# Patient Record
Sex: Female | Born: 1935
Health system: Southern US, Community
[De-identification: ages and names within clinical notes are randomized; demographics above are authoritative.]

## PROBLEM LIST (undated history)

## (undated) DIAGNOSIS — K5790 Diverticulosis of intestine, part unspecified, without perforation or abscess without bleeding: Secondary | ICD-10-CM

## (undated) DIAGNOSIS — E039 Hypothyroidism, unspecified: Secondary | ICD-10-CM

## (undated) DIAGNOSIS — J189 Pneumonia, unspecified organism: Secondary | ICD-10-CM

## (undated) DIAGNOSIS — C801 Malignant (primary) neoplasm, unspecified: Secondary | ICD-10-CM

## (undated) DIAGNOSIS — R112 Nausea with vomiting, unspecified: Secondary | ICD-10-CM

## (undated) DIAGNOSIS — R350 Frequency of micturition: Secondary | ICD-10-CM

## (undated) DIAGNOSIS — B019 Varicella without complication: Secondary | ICD-10-CM

## (undated) DIAGNOSIS — D649 Anemia, unspecified: Secondary | ICD-10-CM

## (undated) DIAGNOSIS — I10 Essential (primary) hypertension: Secondary | ICD-10-CM

## (undated) DIAGNOSIS — M1990 Unspecified osteoarthritis, unspecified site: Secondary | ICD-10-CM

## (undated) DIAGNOSIS — Z9889 Other specified postprocedural states: Secondary | ICD-10-CM

## (undated) DIAGNOSIS — Z860100 Personal history of colon polyps, unspecified: Secondary | ICD-10-CM

## (undated) DIAGNOSIS — M2550 Pain in unspecified joint: Secondary | ICD-10-CM

## (undated) DIAGNOSIS — M549 Dorsalgia, unspecified: Secondary | ICD-10-CM

## (undated) DIAGNOSIS — R3915 Urgency of urination: Secondary | ICD-10-CM

## (undated) HISTORY — DX: Varicella without complication: B01.9

## (undated) HISTORY — PX: KNEE ARTHROSCOPY: SUR90

## (undated) HISTORY — PX: HAND TENDON SURGERY: SHX663

## (undated) HISTORY — PX: COLONOSCOPY: SHX174

## (undated) HISTORY — PX: EYE SURGERY: SHX253

---

## 1998-06-14 HISTORY — PX: CHOLECYSTECTOMY: SHX55

## 1998-10-02 ENCOUNTER — Other Ambulatory Visit: Admission: RE | Admit: 1998-10-02 | Discharge: 1998-10-02 | Payer: Self-pay | Admitting: Family Medicine

## 1998-12-04 ENCOUNTER — Ambulatory Visit (HOSPITAL_COMMUNITY): Admission: RE | Admit: 1998-12-04 | Discharge: 1998-12-04 | Payer: Self-pay | Admitting: *Deleted

## 1998-12-04 ENCOUNTER — Encounter (INDEPENDENT_AMBULATORY_CARE_PROVIDER_SITE_OTHER): Payer: Self-pay | Admitting: Specialist

## 1999-08-21 ENCOUNTER — Encounter: Payer: Self-pay | Admitting: Orthopedic Surgery

## 1999-08-25 ENCOUNTER — Encounter: Payer: Self-pay | Admitting: Orthopedic Surgery

## 1999-08-25 ENCOUNTER — Inpatient Hospital Stay (HOSPITAL_COMMUNITY): Admission: RE | Admit: 1999-08-25 | Discharge: 1999-08-27 | Payer: Self-pay | Admitting: Orthopedic Surgery

## 1999-12-31 ENCOUNTER — Other Ambulatory Visit: Admission: RE | Admit: 1999-12-31 | Discharge: 1999-12-31 | Payer: Self-pay | Admitting: Family Medicine

## 2001-03-14 ENCOUNTER — Other Ambulatory Visit: Admission: RE | Admit: 2001-03-14 | Discharge: 2001-03-14 | Payer: Self-pay | Admitting: Family Medicine

## 2009-07-03 ENCOUNTER — Encounter: Admission: RE | Admit: 2009-07-03 | Discharge: 2009-08-01 | Payer: Self-pay | Admitting: Rheumatology

## 2011-04-05 ENCOUNTER — Other Ambulatory Visit: Payer: Self-pay | Admitting: Rheumatology

## 2011-04-05 DIAGNOSIS — M545 Low back pain: Secondary | ICD-10-CM

## 2011-04-10 ENCOUNTER — Ambulatory Visit
Admission: RE | Admit: 2011-04-10 | Discharge: 2011-04-10 | Disposition: A | Discharge status: Home / Self Care | Payer: MEDICARE | Source: Ambulatory Visit | Attending: Rheumatology | Admitting: Rheumatology

## 2011-04-10 DIAGNOSIS — M545 Low back pain: Secondary | ICD-10-CM

## 2012-05-19 ENCOUNTER — Encounter (INDEPENDENT_AMBULATORY_CARE_PROVIDER_SITE_OTHER): Payer: Self-pay | Admitting: Surgery

## 2012-05-19 ENCOUNTER — Ambulatory Visit (INDEPENDENT_AMBULATORY_CARE_PROVIDER_SITE_OTHER): Payer: Medicare Other | Admitting: Surgery

## 2012-05-19 VITALS — BP 128/76 | HR 80 | Temp 97.4°F | Resp 18 | Ht 64.0 in | Wt 151.4 lb

## 2012-05-19 DIAGNOSIS — R1031 Right lower quadrant pain: Secondary | ICD-10-CM | POA: Insufficient documentation

## 2012-05-19 NOTE — Progress Notes (Signed)
Re:   Jennifer Goodman DOB:   06/04/1935 MRN:   [id number]  ASSESSMENT AND PLAN: 1.  RLQ abdominal pain - non specific  Not an obvious hernia.  Options include CT scan of abdomen vs doing nothing   I gave her literature on hernias.  At this time, she does not want to do anything further.  But she knows if the pain increases or she has some change to let Dr. Ronne Binning or me know.  I left her appt with me PRN.  2.  Hypertension 3.  Corrected hypothyroidism 4.  Chronic kidney disease, Stage III  Creatinine - 1.02 - 04/10/2012. 5.  GERD 6.  Arthritis, Rheumatoid  Since 1996  Sees Dr. Azzie Roup for rheumatology 7.  Chronic pain meds  On Fentanyl 25 mcgm/3 days and takes one hydrocodone q day  Chief Complaint  Patient presents with  . New Evaluation    RLQ pain   REFERRING PHYSICIAN: Thayer Headings, MD  HISTORY OF PRESENT ILLNESS: Jennifer Goodman is a 77 y.o. (DOB: 30-Nov-1934)  white female whose primary care physician is [facility], MD and comes to me today for RLQ pain, questionable hernia.  Her husband is Suanne Marker, whom I know through [facility].  Patient has had pain for more than 6 months. Localizes the pain to the right lower quadrant. He describes as a burning in quality. The pain bothers her when she plays golf  Of pushes a lawnmower. She's never felt a mass or lump in the area of the pain. Her only abdominal surgery for laparoscopic cholecystectomy by Dr. Kendrick Ranch in 1996. She has had a colonoscopy in 2010 Dr. Marisue Brooklyn.  She has some diverticulosis.  She plans another colonoscopy in the next few months.  She's had not gyn surgery.   No past medical history on file.   No past surgical history on file.    Current Outpatient Prescriptions  Medication Sig Dispense Refill  . etanercept (ENBREL) 50 MG/ML injection Inject 50 mg into the skin once a week.      . fentaNYL (DURAGESIC - DOSED MCG/HR) 25 MCG/HR Place 1 patch onto the skin every 3 (three) days.       . folic acid (FOLVITE) 1 MG tablet Take 1 mg by mouth daily.      . hydrocodone-acetaminophen (LORCET-HD) 5-500 MG per capsule Take 1 capsule by mouth every 6 (six) hours as needed.      . irbesartan (AVAPRO) 150 MG tablet Take 150 mg by mouth at bedtime.      Marland Kitchen levothyroxine (SYNTHROID, LEVOTHROID) 25 MCG tablet Take 25 mcg by mouth daily.      . methotrexate (RHEUMATREX) 2.5 MG tablet Take 2.5 mg by mouth once a week. Caution:Chemotherapy. Protect from light.      . vitamin B-12 (CYANOCOBALAMIN) 100 MCG tablet Take 50 mcg by mouth daily.      . Vitamin D, Ergocalciferol, (DRISDOL) 50000 UNITS CAPS Take 50,000 Units by mouth.         No Known Allergies  REVIEW OF SYSTEMS: Skin:  No history of rash.  No history of abnormal moles. Infection:  No history of hepatitis or HIV.  No history of MRSA. Neurologic:  No history of stroke.  No history of seizure.  No history of headaches. Cardiac: Hypertension.  No history of heart disease.   Pulmonary:  Does not smoke cigarettes.  No asthma or bronchitis.  No OSA/CPAP.  Quit smoking about 30 years ago.  Endocrine:  No diabetes. Corrected hypothyroidism.  TSH - 0.945 - 04/10/2012. Gastrointestinal:  See HPI. Urologic:  Chronic kidney disease, Stage III.  Creatinine - 1.02 - 04/10/2012. Musculoskeletal:  Rheumatoid arthritis.  Sees Dr. Azzie Roup.  On Methotrexate and Embrel.  Had knee surgery in the remote past by Dr. Priscille Kluver.  Had right wrist surgery by Dr. Trina Ao for RA. Hematologic:  No bleeding disorder.  No history of anemia.  Not anticoagulated. Psycho-social:  The patient is oriented.   The patient has no obvious psychologic or social impairment to understanding our conversation and plan.  SOCIAL and FAMILY HISTORY: Married. Husband is Suanne Marker.  PHYSICAL EXAM: BP 128/76  Pulse 80  Temp 97.4 F (36.3 C) (Temporal)  Resp 18  Ht 5\' 4"  (1.626 m)  Wt 151 lb 6.4 oz (68.675 kg)  BMI 25.99 kg/m2  General: Older WN WF,  somewhat stooped, who is alert. HEENT: Normal. Pupils equal. Neck: Supple. No mass.  No thyroid mass. Lymph Nodes:  No inguinal adenopathy. Lungs: Clear to auscultation and symmetric breath sounds. Heart:  RRR. No murmur or rub. Abdomen: Soft. No mass. No tenderness. No hernia. Normal bowel sounds.  Scars of lap chole. Extremities:  RA changes of hands and wrists. Neurologic:  Grossly intact to motor and sensory function. Psychiatric: Has normal mood and affect. Behavior is normal.   DATA REVIEWED: Dr. Dimas Millin office notes and labs.  Ovidio Kin, MD,  [facility] [address] [place]  [address]   [place], [place]    [address] Phone:  [phone] FAX:  [phone]

## 2014-06-20 DIAGNOSIS — D81818 Other biotin-dependent carboxylase deficiency: Secondary | ICD-10-CM | POA: Diagnosis not present

## 2014-06-27 DIAGNOSIS — L570 Actinic keratosis: Secondary | ICD-10-CM | POA: Diagnosis not present

## 2014-06-27 DIAGNOSIS — L821 Other seborrheic keratosis: Secondary | ICD-10-CM | POA: Diagnosis not present

## 2014-06-27 DIAGNOSIS — Z85828 Personal history of other malignant neoplasm of skin: Secondary | ICD-10-CM | POA: Diagnosis not present

## 2014-07-22 DIAGNOSIS — E538 Deficiency of other specified B group vitamins: Secondary | ICD-10-CM | POA: Diagnosis not present

## 2014-08-21 DIAGNOSIS — M0579 Rheumatoid arthritis with rheumatoid factor of multiple sites without organ or systems involvement: Secondary | ICD-10-CM | POA: Diagnosis not present

## 2014-09-05 DIAGNOSIS — M859 Disorder of bone density and structure, unspecified: Secondary | ICD-10-CM | POA: Diagnosis not present

## 2014-09-05 DIAGNOSIS — I10 Essential (primary) hypertension: Secondary | ICD-10-CM | POA: Diagnosis not present

## 2014-09-05 DIAGNOSIS — E538 Deficiency of other specified B group vitamins: Secondary | ICD-10-CM | POA: Diagnosis not present

## 2014-09-12 DIAGNOSIS — N183 Chronic kidney disease, stage 3 (moderate): Secondary | ICD-10-CM | POA: Diagnosis not present

## 2014-09-12 DIAGNOSIS — K219 Gastro-esophageal reflux disease without esophagitis: Secondary | ICD-10-CM | POA: Diagnosis not present

## 2014-09-12 DIAGNOSIS — E039 Hypothyroidism, unspecified: Secondary | ICD-10-CM | POA: Diagnosis not present

## 2014-09-12 DIAGNOSIS — I10 Essential (primary) hypertension: Secondary | ICD-10-CM | POA: Diagnosis not present

## 2014-09-23 DIAGNOSIS — M19012 Primary osteoarthritis, left shoulder: Secondary | ICD-10-CM | POA: Diagnosis not present

## 2014-09-23 DIAGNOSIS — M25512 Pain in left shoulder: Secondary | ICD-10-CM | POA: Diagnosis not present

## 2014-09-23 DIAGNOSIS — M159 Polyosteoarthritis, unspecified: Secondary | ICD-10-CM | POA: Diagnosis not present

## 2014-09-23 DIAGNOSIS — M0579 Rheumatoid arthritis with rheumatoid factor of multiple sites without organ or systems involvement: Secondary | ICD-10-CM | POA: Diagnosis not present

## 2014-09-23 DIAGNOSIS — D81818 Other biotin-dependent carboxylase deficiency: Secondary | ICD-10-CM | POA: Diagnosis not present

## 2014-10-18 DIAGNOSIS — H26493 Other secondary cataract, bilateral: Secondary | ICD-10-CM | POA: Diagnosis not present

## 2014-10-23 DIAGNOSIS — D81818 Other biotin-dependent carboxylase deficiency: Secondary | ICD-10-CM | POA: Diagnosis not present

## 2014-11-18 DIAGNOSIS — Z79899 Other long term (current) drug therapy: Secondary | ICD-10-CM | POA: Diagnosis not present

## 2014-11-18 DIAGNOSIS — M069 Rheumatoid arthritis, unspecified: Secondary | ICD-10-CM | POA: Diagnosis not present

## 2014-11-18 DIAGNOSIS — G894 Chronic pain syndrome: Secondary | ICD-10-CM | POA: Diagnosis not present

## 2014-11-18 DIAGNOSIS — M542 Cervicalgia: Secondary | ICD-10-CM | POA: Diagnosis not present

## 2014-11-18 DIAGNOSIS — M1990 Unspecified osteoarthritis, unspecified site: Secondary | ICD-10-CM | POA: Diagnosis not present

## 2014-11-25 DIAGNOSIS — E538 Deficiency of other specified B group vitamins: Secondary | ICD-10-CM | POA: Diagnosis not present

## 2014-12-26 DIAGNOSIS — L820 Inflamed seborrheic keratosis: Secondary | ICD-10-CM | POA: Diagnosis not present

## 2014-12-26 DIAGNOSIS — L570 Actinic keratosis: Secondary | ICD-10-CM | POA: Diagnosis not present

## 2014-12-26 DIAGNOSIS — L821 Other seborrheic keratosis: Secondary | ICD-10-CM | POA: Diagnosis not present

## 2014-12-26 DIAGNOSIS — E538 Deficiency of other specified B group vitamins: Secondary | ICD-10-CM | POA: Diagnosis not present

## 2014-12-26 DIAGNOSIS — C44622 Squamous cell carcinoma of skin of right upper limb, including shoulder: Secondary | ICD-10-CM | POA: Diagnosis not present

## 2014-12-30 DIAGNOSIS — Z79899 Other long term (current) drug therapy: Secondary | ICD-10-CM | POA: Diagnosis not present

## 2014-12-30 DIAGNOSIS — G894 Chronic pain syndrome: Secondary | ICD-10-CM | POA: Diagnosis not present

## 2014-12-30 DIAGNOSIS — M1990 Unspecified osteoarthritis, unspecified site: Secondary | ICD-10-CM | POA: Diagnosis not present

## 2014-12-30 DIAGNOSIS — M069 Rheumatoid arthritis, unspecified: Secondary | ICD-10-CM | POA: Diagnosis not present

## 2014-12-30 DIAGNOSIS — M542 Cervicalgia: Secondary | ICD-10-CM | POA: Diagnosis not present

## 2015-01-02 DIAGNOSIS — C44622 Squamous cell carcinoma of skin of right upper limb, including shoulder: Secondary | ICD-10-CM | POA: Diagnosis not present

## 2015-01-03 DIAGNOSIS — R350 Frequency of micturition: Secondary | ICD-10-CM | POA: Diagnosis not present

## 2015-01-03 DIAGNOSIS — N3946 Mixed incontinence: Secondary | ICD-10-CM | POA: Diagnosis not present

## 2015-01-21 DIAGNOSIS — M25562 Pain in left knee: Secondary | ICD-10-CM | POA: Diagnosis not present

## 2015-01-21 DIAGNOSIS — M1712 Unilateral primary osteoarthritis, left knee: Secondary | ICD-10-CM | POA: Diagnosis not present

## 2015-01-21 DIAGNOSIS — M67912 Unspecified disorder of synovium and tendon, left shoulder: Secondary | ICD-10-CM | POA: Diagnosis not present

## 2015-01-27 DIAGNOSIS — E538 Deficiency of other specified B group vitamins: Secondary | ICD-10-CM | POA: Diagnosis not present

## 2015-01-27 DIAGNOSIS — Z79899 Other long term (current) drug therapy: Secondary | ICD-10-CM | POA: Diagnosis not present

## 2015-01-27 DIAGNOSIS — M069 Rheumatoid arthritis, unspecified: Secondary | ICD-10-CM | POA: Diagnosis not present

## 2015-01-27 DIAGNOSIS — M1990 Unspecified osteoarthritis, unspecified site: Secondary | ICD-10-CM | POA: Diagnosis not present

## 2015-01-27 DIAGNOSIS — G894 Chronic pain syndrome: Secondary | ICD-10-CM | POA: Diagnosis not present

## 2015-01-27 DIAGNOSIS — M542 Cervicalgia: Secondary | ICD-10-CM | POA: Diagnosis not present

## 2015-01-29 DIAGNOSIS — M150 Primary generalized (osteo)arthritis: Secondary | ICD-10-CM | POA: Diagnosis not present

## 2015-01-29 DIAGNOSIS — M0589 Other rheumatoid arthritis with rheumatoid factor of multiple sites: Secondary | ICD-10-CM | POA: Diagnosis not present

## 2015-02-25 DIAGNOSIS — S51801A Unspecified open wound of right forearm, initial encounter: Secondary | ICD-10-CM | POA: Diagnosis not present

## 2015-02-25 DIAGNOSIS — L03113 Cellulitis of right upper limb: Secondary | ICD-10-CM | POA: Diagnosis not present

## 2015-02-28 DIAGNOSIS — E538 Deficiency of other specified B group vitamins: Secondary | ICD-10-CM | POA: Diagnosis not present

## 2015-03-04 DIAGNOSIS — M67912 Unspecified disorder of synovium and tendon, left shoulder: Secondary | ICD-10-CM | POA: Diagnosis not present

## 2015-03-04 DIAGNOSIS — M25562 Pain in left knee: Secondary | ICD-10-CM | POA: Diagnosis not present

## 2015-03-07 ENCOUNTER — Other Ambulatory Visit: Payer: Self-pay | Admitting: Orthopedic Surgery

## 2015-03-07 DIAGNOSIS — M25512 Pain in left shoulder: Secondary | ICD-10-CM

## 2015-03-18 ENCOUNTER — Ambulatory Visit
Admission: RE | Admit: 2015-03-18 | Discharge: 2015-03-18 | Disposition: A | Discharge status: Home / Self Care | Payer: Medicare Other | Source: Ambulatory Visit | Attending: Orthopedic Surgery | Admitting: Orthopedic Surgery

## 2015-03-18 DIAGNOSIS — M25512 Pain in left shoulder: Secondary | ICD-10-CM

## 2015-03-18 DIAGNOSIS — M75122 Complete rotator cuff tear or rupture of left shoulder, not specified as traumatic: Secondary | ICD-10-CM | POA: Diagnosis not present

## 2015-03-20 DIAGNOSIS — M8580 Other specified disorders of bone density and structure, unspecified site: Secondary | ICD-10-CM | POA: Diagnosis not present

## 2015-03-20 DIAGNOSIS — I10 Essential (primary) hypertension: Secondary | ICD-10-CM | POA: Diagnosis not present

## 2015-03-20 DIAGNOSIS — F17211 Nicotine dependence, cigarettes, in remission: Secondary | ICD-10-CM | POA: Diagnosis not present

## 2015-03-20 DIAGNOSIS — Z23 Encounter for immunization: Secondary | ICD-10-CM | POA: Diagnosis not present

## 2015-03-20 DIAGNOSIS — Z0000 Encounter for general adult medical examination without abnormal findings: Secondary | ICD-10-CM | POA: Diagnosis not present

## 2015-03-20 DIAGNOSIS — E538 Deficiency of other specified B group vitamins: Secondary | ICD-10-CM | POA: Diagnosis not present

## 2015-03-20 DIAGNOSIS — Z1389 Encounter for screening for other disorder: Secondary | ICD-10-CM | POA: Diagnosis not present

## 2015-03-24 DIAGNOSIS — M67912 Unspecified disorder of synovium and tendon, left shoulder: Secondary | ICD-10-CM | POA: Diagnosis not present

## 2015-03-27 DIAGNOSIS — N183 Chronic kidney disease, stage 3 (moderate): Secondary | ICD-10-CM | POA: Diagnosis not present

## 2015-03-27 DIAGNOSIS — M8580 Other specified disorders of bone density and structure, unspecified site: Secondary | ICD-10-CM | POA: Diagnosis not present

## 2015-03-27 DIAGNOSIS — I129 Hypertensive chronic kidney disease with stage 1 through stage 4 chronic kidney disease, or unspecified chronic kidney disease: Secondary | ICD-10-CM | POA: Diagnosis not present

## 2015-03-27 DIAGNOSIS — E039 Hypothyroidism, unspecified: Secondary | ICD-10-CM | POA: Diagnosis not present

## 2015-04-01 DIAGNOSIS — E538 Deficiency of other specified B group vitamins: Secondary | ICD-10-CM | POA: Diagnosis not present

## 2015-04-10 DIAGNOSIS — M25561 Pain in right knee: Secondary | ICD-10-CM | POA: Diagnosis not present

## 2015-04-10 DIAGNOSIS — M25562 Pain in left knee: Secondary | ICD-10-CM | POA: Diagnosis not present

## 2015-04-10 DIAGNOSIS — M1711 Unilateral primary osteoarthritis, right knee: Secondary | ICD-10-CM | POA: Diagnosis not present

## 2015-04-10 DIAGNOSIS — M1712 Unilateral primary osteoarthritis, left knee: Secondary | ICD-10-CM | POA: Diagnosis not present

## 2015-04-29 DIAGNOSIS — Z1231 Encounter for screening mammogram for malignant neoplasm of breast: Secondary | ICD-10-CM | POA: Diagnosis not present

## 2015-04-29 DIAGNOSIS — M1712 Unilateral primary osteoarthritis, left knee: Secondary | ICD-10-CM | POA: Diagnosis not present

## 2015-05-01 ENCOUNTER — Other Ambulatory Visit: Payer: Self-pay | Admitting: Orthopedic Surgery

## 2015-05-05 ENCOUNTER — Encounter (HOSPITAL_COMMUNITY): Payer: Self-pay | Admitting: *Deleted

## 2015-05-05 ENCOUNTER — Emergency Department (HOSPITAL_COMMUNITY): Payer: Medicare Other

## 2015-05-05 ENCOUNTER — Emergency Department (HOSPITAL_COMMUNITY)
Admission: EM | Admit: 2015-05-05 | Discharge: 2015-05-05 | Disposition: A | Discharge status: Home / Self Care | Payer: Medicare Other | Attending: Emergency Medicine | Admitting: Emergency Medicine

## 2015-05-05 DIAGNOSIS — Z79891 Long term (current) use of opiate analgesic: Secondary | ICD-10-CM | POA: Diagnosis not present

## 2015-05-05 DIAGNOSIS — R1013 Epigastric pain: Secondary | ICD-10-CM | POA: Insufficient documentation

## 2015-05-05 DIAGNOSIS — R112 Nausea with vomiting, unspecified: Secondary | ICD-10-CM | POA: Diagnosis not present

## 2015-05-05 DIAGNOSIS — Z87891 Personal history of nicotine dependence: Secondary | ICD-10-CM | POA: Diagnosis not present

## 2015-05-05 DIAGNOSIS — R1012 Left upper quadrant pain: Secondary | ICD-10-CM | POA: Diagnosis not present

## 2015-05-05 DIAGNOSIS — Z79899 Other long term (current) drug therapy: Secondary | ICD-10-CM | POA: Insufficient documentation

## 2015-05-05 DIAGNOSIS — E079 Disorder of thyroid, unspecified: Secondary | ICD-10-CM | POA: Insufficient documentation

## 2015-05-05 DIAGNOSIS — M1990 Unspecified osteoarthritis, unspecified site: Secondary | ICD-10-CM | POA: Diagnosis not present

## 2015-05-05 DIAGNOSIS — R109 Unspecified abdominal pain: Secondary | ICD-10-CM | POA: Diagnosis not present

## 2015-05-05 DIAGNOSIS — I10 Essential (primary) hypertension: Secondary | ICD-10-CM | POA: Insufficient documentation

## 2015-05-05 HISTORY — DX: Unspecified osteoarthritis, unspecified site: M19.90

## 2015-05-05 HISTORY — DX: Essential (primary) hypertension: I10

## 2015-05-05 LAB — URINALYSIS, ROUTINE W REFLEX MICROSCOPIC
Bilirubin Urine: NEGATIVE
GLUCOSE, UA: NEGATIVE mg/dL
HGB URINE DIPSTICK: NEGATIVE
Ketones, ur: NEGATIVE mg/dL
LEUKOCYTES UA: NEGATIVE
Nitrite: NEGATIVE
PH: 8 (ref 5.0–8.0)
PROTEIN: NEGATIVE mg/dL
SPECIFIC GRAVITY, URINE: 1.017 (ref 1.005–1.030)

## 2015-05-05 LAB — COMPREHENSIVE METABOLIC PANEL
ALT: 19 U/L (ref 14–54)
AST: 22 U/L (ref 15–41)
Albumin: 3.4 g/dL — ABNORMAL LOW (ref 3.5–5.0)
Alkaline Phosphatase: 52 U/L (ref 38–126)
Anion gap: 11 (ref 5–15)
BUN: 8 mg/dL (ref 6–20)
CHLORIDE: 103 mmol/L (ref 101–111)
CO2: 22 mmol/L (ref 22–32)
Calcium: 8.9 mg/dL (ref 8.9–10.3)
Creatinine, Ser: 1.04 mg/dL — ABNORMAL HIGH (ref 0.44–1.00)
GFR, EST AFRICAN AMERICAN: 58 mL/min — AB (ref >60)
GFR, EST NON AFRICAN AMERICAN: 50 mL/min — AB (ref >60)
Glucose, Bld: 108 mg/dL — ABNORMAL HIGH (ref 65–99)
POTASSIUM: 3.6 mmol/L (ref 3.5–5.1)
SODIUM: 136 mmol/L (ref 135–145)
Total Bilirubin: 0.9 mg/dL (ref 0.3–1.2)
Total Protein: 5.6 g/dL — ABNORMAL LOW (ref 6.5–8.1)

## 2015-05-05 LAB — CBC
HEMATOCRIT: 36.2 % (ref 36.0–46.0)
HEMOGLOBIN: 12 g/dL (ref 12.0–15.0)
MCH: 33.1 pg (ref 26.0–34.0)
MCHC: 33.1 g/dL (ref 30.0–36.0)
MCV: 99.7 fL (ref 78.0–100.0)
Platelets: 220 10*3/uL (ref 150–400)
RBC: 3.63 MIL/uL — AB (ref 3.87–5.11)
RDW: 14.9 % (ref 11.5–15.5)
WBC: 5.4 10*3/uL (ref 4.0–10.5)

## 2015-05-05 LAB — LIPASE, BLOOD: LIPASE: 26 U/L (ref 11–51)

## 2015-05-05 MED ORDER — PANTOPRAZOLE SODIUM 20 MG PO TBEC
20.0000 mg | DELAYED_RELEASE_TABLET | Freq: Every day | ORAL | Status: DC
Start: 2015-05-05 — End: 2015-06-06

## 2015-05-05 MED ORDER — BARIUM SULFATE 2.1 % PO SUSP
ORAL | Status: AC
  Filled 2015-05-05: qty 1

## 2015-05-05 MED ORDER — MORPHINE SULFATE (PF) 4 MG/ML IV SOLN
4.0000 mg | Freq: Once | INTRAVENOUS | Status: AC
  Administered 2015-05-05: 4 mg via INTRAVENOUS
  Filled 2015-05-05: qty 1

## 2015-05-05 MED ORDER — ONDANSETRON HCL 4 MG/2ML IJ SOLN: 4.0000 mg | Freq: Once | INTRAMUSCULAR | Status: DC

## 2015-05-05 MED ORDER — SUCRALFATE 1 G PO TABS: 1.0000 g | ORAL_TABLET | Freq: Three times a day (TID) | ORAL | Status: DC

## 2015-05-05 MED ORDER — IOHEXOL 300 MG/ML  SOLN
50.0000 mL | Freq: Once | INTRAMUSCULAR | Status: DC | PRN
  Administered 2015-05-05: 100 mL via INTRAVENOUS
  Filled 2015-05-05: qty 50

## 2015-05-05 MED ORDER — ONDANSETRON HCL 4 MG/2ML IJ SOLN
4.0000 mg | Freq: Once | INTRAMUSCULAR | Status: AC | PRN
  Administered 2015-05-05: 4 mg via INTRAVENOUS
  Filled 2015-05-05: qty 2

## 2015-05-05 MED ORDER — SODIUM CHLORIDE 0.9 % IV BOLUS (SEPSIS)
1000.0000 mL | Freq: Once | INTRAVENOUS | Status: AC
  Administered 2015-05-05: 1000 mL via INTRAVENOUS

## 2015-05-05 MED ORDER — HYDROCODONE-ACETAMINOPHEN 5-325 MG PO TABS: 0.5000 | ORAL_TABLET | ORAL | Status: DC | PRN

## 2015-05-05 MED ORDER — PANTOPRAZOLE SODIUM 40 MG IV SOLR
40.0000 mg | Freq: Once | INTRAVENOUS | Status: AC
  Administered 2015-05-05: 40 mg via INTRAVENOUS
  Filled 2015-05-05: qty 40

## 2015-05-05 NOTE — ED Notes (Signed)
Pt presents via POV c/o abdominal pain and nausea/vomiting since Saturday.  Pain mainly in LUQ but spreads throughout abdomen.  Denies fever/chills.  Pt a x 4, NAD.

## 2015-05-05 NOTE — ED Notes (Signed)
PA at the bedside.

## 2015-05-05 NOTE — ED Notes (Signed)
Patient given heating packs for stomach and wheeled out by Amy, NT

## 2015-05-05 NOTE — ED Provider Notes (Signed)
CSN: [id number]     Arrival date & time 05/05/15  0720 History   First MD Initiated Contact with Patient 05/05/15 0735     Chief Complaint  Patient presents with  . Abdominal Pain     (Consider location/radiation/quality/duration/timing/severity/associated sxs/prior Treatment) HPI   Jennifer Goodman is a(n) 80 y.o. female who presents with cc of abdominal pain. The patient has a past medical history of rheumatoid arthritis. She is currently taking Enbrel, methotrexate. The patient also has a history of left knee arthritis and is taking diclofenac. She is scheduled for left total knee arthroplasty. She has a past surgical history of cholecystectomy. Patient states that Saturday evening, 2 days ago she began having left upper quadrant and epigastric abdominal pain. She states that yesterday it was intermittent and "not that bad." Around 11 PM she began having vomiting of nonbloody, nonbilious vomitus. She had one more episode of vomiting overnight. The patient denies a history of constipation, although she is on fentanyl patch for chronic pain. She denies fever or chills. She denies chest pain, shortness of breath, or history of coronary artery disease. She has a known history of diverticulosis.   Past Medical History  Diagnosis Date  . Thyroid disease   . Hypertension   . Arthritis    Past Surgical History  Procedure Laterality Date  . Knee arthroscopy    . Cholecystectomy    . Eye surgery     Family History  Problem Relation Age of Onset  . Cancer Father     colon ca/ lung ca  . Heart disease Father    Social History  Substance Use Topics  . Smoking status: Former Research scientist (life sciences)  . Smokeless tobacco: None     Comment: quit 32 yrs ago  . Alcohol Use: Yes   OB History    No data available     Review of Systems  Ten systems reviewed and are negative for acute change, except as noted in the HPI.    Allergies  Review of patient's allergies indicates no known allergies.  Home  Medications   Prior to Admission medications   Medication Sig Start Date End Date Taking? Authorizing Provider  etanercept (ENBREL) 50 MG/ML injection Inject 50 mg into the skin once a week.    Historical Provider, MD  fentaNYL (DURAGESIC - DOSED MCG/HR) 25 MCG/HR Place 1 patch onto the skin every 3 (three) days.    Historical Provider, MD  folic acid (FOLVITE) 1 MG tablet Take 1 mg by mouth daily.    Historical Provider, MD  hydrocodone-acetaminophen (LORCET-HD) 5-500 MG per capsule Take 1 capsule by mouth every 6 (six) hours as needed.    Historical Provider, MD  irbesartan (AVAPRO) 150 MG tablet Take 150 mg by mouth at bedtime.    Historical Provider, MD  levothyroxine (SYNTHROID, LEVOTHROID) 25 MCG tablet Take 25 mcg by mouth daily.    Historical Provider, MD  methotrexate (RHEUMATREX) 2.5 MG tablet Take 2.5 mg by mouth once a week. Caution:Chemotherapy. Protect from light.    Historical Provider, MD  vitamin B-12 (CYANOCOBALAMIN) 100 MCG tablet Take 50 mcg by mouth daily.    Historical Provider, MD  Vitamin D, Ergocalciferol, (DRISDOL) 50000 UNITS CAPS Take 50,000 Units by mouth.    Historical Provider, MD   There were no vitals taken for this visit. Physical Exam  Constitutional: She is oriented to person, place, and time. She appears well-developed and well-nourished. No distress.  Appears uncomfortable  HENT:  Head: Normocephalic and  atraumatic.  Eyes: Conjunctivae are normal. No scleral icterus.  Neck: Normal range of motion.  Cardiovascular: Normal rate, regular rhythm and normal heart sounds.  Exam reveals no gallop and no friction rub.   No murmur heard. Pulmonary/Chest: Effort normal and breath sounds normal. No respiratory distress.  Abdominal: Soft. Bowel sounds are normal. She exhibits no distension and no mass. There is tenderness. There is no guarding.  Tenderness in the epigastrium and left upper quadrant.  Neurological: She is alert and oriented to person, place, and  time.  Skin: Skin is warm and dry. She is not diaphoretic.  Nursing note and vitals reviewed.   ED Course  Procedures (including critical care time) Labs Review Labs Reviewed  CBC - Abnormal; Notable for the following:    RBC 3.63 (*)    All other components within normal limits  LIPASE, BLOOD  COMPREHENSIVE METABOLIC PANEL  URINALYSIS, ROUTINE W REFLEX MICROSCOPIC (NOT AT [facility])    Imaging Review No results found. I have personally reviewed and evaluated these images and lab results as part of my medical decision-making.   EKG Interpretation None      MDM   Final diagnoses:  Epigastric abdominal pain   8:36 AM There were no vitals taken for this visit.  . 80 year old female with abdominal pain, nausea, vomiting. Present in the epigastrium and left upper quadrant. Differential includes gastritis, pancreatitis, diverticulitis. Patient may also have gastroenteritis. I doubt acute coronary syndrome. Patient will receive lab work, CT abdomen and pelvis. Pain and nausea addressed with morphine and Zofran.   Patient is nontoxic, nonseptic appearing, in no apparent distress.  Patient's pain and other symptoms adequately managed in emergency department.  Fluid bolus given.  Labs, imaging and vitals reviewed.  Patient does not meet the SIRS or Sepsis criteria.  On repeat exam patient does not have a surgical abdomin and there are no peritoneal signs.  No indication of appendicitis, bowel obstruction, bowel perforation, cholecystitis, diverticulitis, Patient discharged home with symptomatic treatment and given strict instructions for follow-up with their primary care physician.  I have also discussed reasons to return immediately to the ER.  Patient expresses understanding and agrees with plan.       Margarita Mail, PA-C 05/05/15 1507  Leo Grosser, MD 05/06/15 [phone]

## 2015-05-05 NOTE — Discharge Instructions (Signed)
Abdominal (belly) pain can be caused by many things. Your caregiver performed an examination and possibly ordered blood/urine tests and imaging (CT scan, x-rays, ultrasound). Many cases can be observed and treated at home after initial evaluation in the emergency department. Even though you are being discharged home, abdominal pain can be unpredictable. Therefore, you need a repeated exam if your pain does not resolve, returns, or worsens. Most patients with abdominal pain don't have to be admitted to the hospital or have surgery, but serious problems like appendicitis and gallbladder attacks can start out as nonspecific pain. Many abdominal conditions cannot be diagnosed in one visit, so follow-up evaluations are very important. SEEK IMMEDIATE MEDICAL ATTENTION IF: The pain does not go away or becomes severe.  A temperature above 101 develops.  Repeated vomiting occurs (multiple episodes).  The pain becomes localized to portions of the abdomen. The right side could possibly be appendicitis. In an adult, the left lower portion of the abdomen could be colitis or diverticulitis.  Blood is being passed in stools or vomit (bright red or black tarry stools).  Return also if you develop chest pain, difficulty breathing, dizziness or fainting, or become confused, poorly responsive, or inconsolable (young children).  Abdominal Pain, Adult Many things can cause abdominal pain. Usually, abdominal pain is not caused by a disease and will improve without treatment. It can often be observed and treated at home. Your health care provider will do a physical exam and possibly order blood tests and X-rays to help determine the seriousness of your pain. However, in many cases, more time must pass before a clear cause of the pain can be found. Before that point, your health care provider may not know if you need more testing or further treatment. HOME CARE INSTRUCTIONS Monitor your abdominal pain for any changes. The  following actions may help to alleviate any discomfort you are experiencing:  Only take over-the-counter or prescription medicines as directed by your health care provider.  Do not take laxatives unless directed to do so by your health care provider.  Try a clear liquid diet (broth, tea, or water) as directed by your health care provider. Slowly move to a bland diet as tolerated. SEEK MEDICAL CARE IF:  You have unexplained abdominal pain.  You have abdominal pain associated with nausea or diarrhea.  You have pain when you urinate or have a bowel movement.  You experience abdominal pain that wakes you in the night.  You have abdominal pain that is worsened or improved by eating food.  You have abdominal pain that is worsened with eating fatty foods.  You have a fever. SEEK IMMEDIATE MEDICAL CARE IF:  Your pain does not go away within 2 hours.  You keep throwing up (vomiting).  Your pain is felt only in portions of the abdomen, such as the right side or the left lower portion of the abdomen.  You pass bloody or black tarry stools. MAKE SURE YOU:  Understand these instructions.  Will watch your condition.  Will get help right away if you are not doing well or get worse.   This information is not intended to replace advice given to you by your health care provider. Make sure you discuss any questions you have with your health care provider.   Document Released: 03/10/2005 Document Revised: 02/19/2015 Document Reviewed: 02/07/2013 Elsevier Interactive Patient Education 2016 Elsevier Inc. Gastritis, Adult Gastritis is soreness and swelling (inflammation) of the lining of the stomach. Gastritis can develop as a sudden  onset (acute) or long-term (chronic) condition. If gastritis is not treated, it can lead to stomach bleeding and ulcers. CAUSES  Gastritis occurs when the stomach lining is weak or damaged. Digestive juices from the stomach then inflame the weakened stomach  lining. The stomach lining may be weak or damaged due to viral or bacterial infections. One common bacterial infection is the Helicobacter pylori infection. Gastritis can also result from excessive alcohol consumption, taking certain medicines, or having too much acid in the stomach.  SYMPTOMS  In some cases, there are no symptoms. When symptoms are present, they may include:  Pain or a burning sensation in the upper abdomen.  Nausea.  Vomiting.  An uncomfortable feeling of fullness after eating. DIAGNOSIS  Your caregiver may suspect you have gastritis based on your symptoms and a physical exam. To determine the cause of your gastritis, your caregiver may perform the following:  Blood or stool tests to check for the H pylori bacterium.  Gastroscopy. A thin, flexible tube (endoscope) is passed down the esophagus and into the stomach. The endoscope has a light and camera on the end. Your caregiver uses the endoscope to view the inside of the stomach.  Taking a tissue sample (biopsy) from the stomach to examine under a microscope. TREATMENT  Depending on the cause of your gastritis, medicines may be prescribed. If you have a bacterial infection, such as an H pylori infection, antibiotics may be given. If your gastritis is caused by too much acid in the stomach, H2 blockers or antacids may be given. Your caregiver may recommend that you stop taking aspirin, ibuprofen, or other nonsteroidal anti-inflammatory drugs (NSAIDs). HOME CARE INSTRUCTIONS  Only take over-the-counter or prescription medicines as directed by your caregiver.  If you were given antibiotic medicines, take them as directed. Finish them even if you start to feel better.  Drink enough fluids to keep your urine clear or pale yellow.  Avoid foods and drinks that make your symptoms worse, such as:  Caffeine or alcoholic drinks.  Chocolate.  Peppermint or mint flavorings.  Garlic and onions.  Spicy foods.  Citrus  fruits, such as oranges, lemons, or limes.  Tomato-based foods such as sauce, chili, salsa, and pizza.  Fried and fatty foods.  Eat small, frequent meals instead of large meals. SEEK IMMEDIATE MEDICAL CARE IF:   You have black or dark red stools.  You vomit blood or material that looks like coffee grounds.  You are unable to keep fluids down.  Your abdominal pain gets worse.  You have a fever.  You do not feel better after 1 week.  You have any other questions or concerns. MAKE SURE YOU:  Understand these instructions.  Will watch your condition.  Will get help right away if you are not doing well or get worse.   This information is not intended to replace advice given to you by your health care provider. Make sure you discuss any questions you have with your health care provider.   Document Released: 05/25/2001 Document Revised: 11/30/2011 Document Reviewed: 07/14/2011 Elsevier Interactive Patient Education [employer].

## 2015-05-05 NOTE — ED Notes (Signed)
Patient went to CT

## 2015-05-06 DIAGNOSIS — E538 Deficiency of other specified B group vitamins: Secondary | ICD-10-CM | POA: Diagnosis not present

## 2015-05-27 DIAGNOSIS — M170 Bilateral primary osteoarthritis of knee: Secondary | ICD-10-CM | POA: Diagnosis not present

## 2015-05-29 DIAGNOSIS — G894 Chronic pain syndrome: Secondary | ICD-10-CM | POA: Diagnosis not present

## 2015-05-29 DIAGNOSIS — M150 Primary generalized (osteo)arthritis: Secondary | ICD-10-CM | POA: Diagnosis not present

## 2015-05-29 DIAGNOSIS — M0589 Other rheumatoid arthritis with rheumatoid factor of multiple sites: Secondary | ICD-10-CM | POA: Diagnosis not present

## 2015-06-10 ENCOUNTER — Encounter (HOSPITAL_COMMUNITY)
Admission: RE | Admit: 2015-06-10 | Discharge: 2015-06-10 | Disposition: A | Discharge status: Home / Self Care | Payer: Medicare Other | Source: Ambulatory Visit | Attending: Orthopedic Surgery | Admitting: Orthopedic Surgery

## 2015-06-10 ENCOUNTER — Encounter (HOSPITAL_COMMUNITY): Payer: Self-pay

## 2015-06-10 ENCOUNTER — Ambulatory Visit (HOSPITAL_COMMUNITY)
Admission: RE | Admit: 2015-06-10 | Discharge: 2015-06-10 | Disposition: A | Discharge status: Home / Self Care | Payer: Medicare Other | Source: Ambulatory Visit | Attending: Orthopedic Surgery | Admitting: Orthopedic Surgery

## 2015-06-10 DIAGNOSIS — Z87891 Personal history of nicotine dependence: Secondary | ICD-10-CM | POA: Diagnosis not present

## 2015-06-10 DIAGNOSIS — Z01818 Encounter for other preprocedural examination: Secondary | ICD-10-CM | POA: Diagnosis not present

## 2015-06-10 DIAGNOSIS — Z01810 Encounter for preprocedural cardiovascular examination: Secondary | ICD-10-CM | POA: Diagnosis not present

## 2015-06-10 DIAGNOSIS — Z01812 Encounter for preprocedural laboratory examination: Secondary | ICD-10-CM | POA: Diagnosis not present

## 2015-06-10 DIAGNOSIS — Z96652 Presence of left artificial knee joint: Secondary | ICD-10-CM | POA: Diagnosis not present

## 2015-06-10 HISTORY — DX: Pneumonia, unspecified organism: J18.9

## 2015-06-10 HISTORY — DX: Hypothyroidism, unspecified: E03.9

## 2015-06-10 HISTORY — DX: Nausea with vomiting, unspecified: R11.2

## 2015-06-10 HISTORY — DX: Anemia, unspecified: D64.9

## 2015-06-10 HISTORY — DX: Other specified postprocedural states: Z98.890

## 2015-06-10 LAB — URINALYSIS, ROUTINE W REFLEX MICROSCOPIC
Bilirubin Urine: NEGATIVE
GLUCOSE, UA: NEGATIVE mg/dL
KETONES UR: NEGATIVE mg/dL
Nitrite: NEGATIVE
PH: 6 (ref 5.0–8.0)
Protein, ur: NEGATIVE mg/dL
SPECIFIC GRAVITY, URINE: 1.01 (ref 1.005–1.030)

## 2015-06-10 LAB — CBC WITH DIFFERENTIAL/PLATELET
Basophils Absolute: 0 10*3/uL (ref 0.0–0.1)
Basophils Relative: 0 %
EOS PCT: 3 %
Eosinophils Absolute: 0.3 10*3/uL (ref 0.0–0.7)
HCT: 36.3 % (ref 36.0–46.0)
Hemoglobin: 12 g/dL (ref 12.0–15.0)
LYMPHS ABS: 1.8 10*3/uL (ref 0.7–4.0)
LYMPHS PCT: 23 %
MCH: 33.7 pg (ref 26.0–34.0)
MCHC: 33.1 g/dL (ref 30.0–36.0)
MCV: 102 fL — AB (ref 78.0–100.0)
MONO ABS: 0.6 10*3/uL (ref 0.1–1.0)
MONOS PCT: 7 %
Neutro Abs: 5 10*3/uL (ref 1.7–7.7)
Neutrophils Relative %: 67 %
PLATELETS: 172 10*3/uL (ref 150–400)
RBC: 3.56 MIL/uL — AB (ref 3.87–5.11)
RDW: 14 % (ref 11.5–15.5)
WBC: 7.6 10*3/uL (ref 4.0–10.5)

## 2015-06-10 LAB — COMPREHENSIVE METABOLIC PANEL
ALT: 14 U/L (ref 14–54)
AST: 24 U/L (ref 15–41)
Albumin: 4 g/dL (ref 3.5–5.0)
Alkaline Phosphatase: 60 U/L (ref 38–126)
Anion gap: 10 (ref 5–15)
BUN: 16 mg/dL (ref 6–20)
CHLORIDE: 103 mmol/L (ref 101–111)
CO2: 26 mmol/L (ref 22–32)
CREATININE: 1.36 mg/dL — AB (ref 0.44–1.00)
Calcium: 9.6 mg/dL (ref 8.9–10.3)
GFR, EST AFRICAN AMERICAN: 42 mL/min — AB (ref >60)
GFR, EST NON AFRICAN AMERICAN: 36 mL/min — AB (ref >60)
Glucose, Bld: 96 mg/dL (ref 65–99)
Potassium: 4.3 mmol/L (ref 3.5–5.1)
Sodium: 139 mmol/L (ref 135–145)
Total Bilirubin: 1.3 mg/dL — ABNORMAL HIGH (ref 0.3–1.2)
Total Protein: 6.4 g/dL — ABNORMAL LOW (ref 6.5–8.1)

## 2015-06-10 LAB — SURGICAL PCR SCREEN
MRSA, PCR: NEGATIVE
Staphylococcus aureus: POSITIVE — AB

## 2015-06-10 LAB — TYPE AND SCREEN
ABO/RH(D): A POS
Antibody Screen: NEGATIVE

## 2015-06-10 LAB — URINE MICROSCOPIC-ADD ON

## 2015-06-10 LAB — PROTIME-INR
INR: 1.04 (ref 0.00–1.49)
PROTHROMBIN TIME: 13.8 s (ref 11.6–15.2)

## 2015-06-10 LAB — APTT: aPTT: 32 seconds (ref 24–37)

## 2015-06-10 LAB — ABO/RH: ABO/RH(D): A POS

## 2015-06-10 NOTE — Pre-Procedure Instructions (Signed)
    Jennifer Goodman  06/10/2015      [facility] - [place], [place] - [address] [place] [address] Phone: [phone] Fax: [phone]    Your procedure is scheduled on Friday January 6th 2017 at 1145am.  Report to [place] at 945 A.M.  Call this number if you have problems the morning of surgery:  [phone]   Remember:  Do not eat food or drink liquids after midnight.  Take these medicines the morning of surgery with A SIP OF WATER hydrocodone-acetaminophen (norco/vicodin), levothyroxine (levothroid/synthroid), mirabegron ER (myrbetriq)  STOP: ALL Vitamins, Supplements, Effient and Herbal Medications, Fish Oils, Aspirins, NSAIDs (Nonsteroidal Anti-inflammatories such as Ibuprofen, Aleve, or Advil), and Goody's/BC Powders 7 days prior to surgery, until after surgery as directed by your physician.     Do not wear jewelry, make-up or nail polish.  Do not wear lotions, powders, or perfumes.  You may wear deodorant.  Do not shave 48 hours prior to surgery.    Do not bring valuables to the hospital.  [facility] is not responsible for any belongings or valuables.  Contacts, dentures or bridgework may not be worn into surgery.  Leave your suitcase in the car.  After surgery it may be brought to your room.  For patients admitted to the hospital, discharge time will be determined by your treatment team.  Patients discharged the day of surgery will not be allowed to drive home.   Please follow these instructions carefully:  1. Shower with CHG Soap the night before surgery and the morning of Surgery. 2. If you choose to wash your hair, wash your hair first as usual with your normal shampoo. 3. After you shampoo, rinse your hair and body thoroughly to remove the Shampoo. 4. Use CHG as you would any other liquid soap. You can apply chg directly to the skin  and wash gently with scrungie or a clean washcloth. 5. Apply the CHG Soap to your body ONLY FROM THE NECK DOWN. Do not use on open wounds or open sores. Avoid contact with your eyes, ears, mouth and genitals (private parts). Wash genitals (private parts) with your normal soap. 6. Wash thoroughly, paying special attention to the area where your surgery will be performed. 7. Thoroughly rinse your body with warm water from the neck down. 8. DO NOT shower/wash with your normal soap after using and rinsing off the CHG Soap. 9. Pat yourself dry with a clean towel.  10. Wear clean pajamas.  11. Place clean sheets on your bed the night of your first shower and do not sleep with pets.  Day of Surgery  Do not apply any lotions/deodorants the morning of surgery. Please wear clean clothes to the hospital/surgery center.    Please read over the following fact sheets that you were given. Pain Booklet, Coughing and Deep Breathing, Blood Transfusion Information, Total Joint Packet, MRSA Information and Surgical Site Infection Prevention

## 2015-06-10 NOTE — Progress Notes (Signed)
Thressa Sheller, MD Is patient's pcp.  Patient denies ever having any echocardiogram, ekg, stress test, cardiac catheterization.  She did see a cardiologist over 20 years ago for heart palpitations.  She 'wore a monitor for a few days' and states "'everything was normal and I never had to go back."  She has been instructed not to take her etanercept or methotrexate for the next three weeks by her physician.

## 2015-06-11 DIAGNOSIS — E538 Deficiency of other specified B group vitamins: Secondary | ICD-10-CM | POA: Diagnosis not present

## 2015-06-19 MED ORDER — CHLORHEXIDINE GLUCONATE 4 % EX LIQD: 60.0000 mL | Freq: Once | CUTANEOUS | Status: DC

## 2015-06-19 MED ORDER — CEFAZOLIN SODIUM-DEXTROSE 2-3 GM-% IV SOLR
2.0000 g | INTRAVENOUS | Status: AC
  Administered 2015-06-20: 2 g via INTRAVENOUS
  Filled 2015-06-19 (×2): qty 50

## 2015-06-20 ENCOUNTER — Encounter (HOSPITAL_COMMUNITY)
Admission: RE | Disposition: A | Discharge status: Home Health | Payer: Self-pay | Source: Ambulatory Visit | Attending: Orthopedic Surgery

## 2015-06-20 ENCOUNTER — Inpatient Hospital Stay (HOSPITAL_COMMUNITY): Payer: Medicare Other | Admitting: Anesthesiology

## 2015-06-20 ENCOUNTER — Inpatient Hospital Stay (HOSPITAL_COMMUNITY): Payer: Medicare Other | Admitting: Vascular Surgery

## 2015-06-20 ENCOUNTER — Inpatient Hospital Stay (HOSPITAL_COMMUNITY)
Admission: RE | Admit: 2015-06-20 | Discharge: 2015-06-22 | DRG: 470 | Disposition: A | Discharge status: Home Health | Payer: Medicare Other | Source: Ambulatory Visit | Attending: Orthopedic Surgery | Admitting: Orthopedic Surgery

## 2015-06-20 ENCOUNTER — Encounter (HOSPITAL_COMMUNITY): Payer: Self-pay | Admitting: Anesthesiology

## 2015-06-20 DIAGNOSIS — Y929 Unspecified place or not applicable: Secondary | ICD-10-CM

## 2015-06-20 DIAGNOSIS — M810 Age-related osteoporosis without current pathological fracture: Secondary | ICD-10-CM | POA: Diagnosis not present

## 2015-06-20 DIAGNOSIS — M1712 Unilateral primary osteoarthritis, left knee: Secondary | ICD-10-CM | POA: Diagnosis not present

## 2015-06-20 DIAGNOSIS — X58XXXA Exposure to other specified factors, initial encounter: Secondary | ICD-10-CM | POA: Diagnosis present

## 2015-06-20 DIAGNOSIS — S72432A Displaced fracture of medial condyle of left femur, initial encounter for closed fracture: Secondary | ICD-10-CM | POA: Diagnosis not present

## 2015-06-20 DIAGNOSIS — I10 Essential (primary) hypertension: Secondary | ICD-10-CM | POA: Diagnosis present

## 2015-06-20 DIAGNOSIS — E039 Hypothyroidism, unspecified: Secondary | ICD-10-CM | POA: Diagnosis present

## 2015-06-20 DIAGNOSIS — Z87891 Personal history of nicotine dependence: Secondary | ICD-10-CM | POA: Diagnosis not present

## 2015-06-20 DIAGNOSIS — M25762 Osteophyte, left knee: Secondary | ICD-10-CM | POA: Diagnosis not present

## 2015-06-20 DIAGNOSIS — G8918 Other acute postprocedural pain: Secondary | ICD-10-CM | POA: Diagnosis not present

## 2015-06-20 DIAGNOSIS — M179 Osteoarthritis of knee, unspecified: Secondary | ICD-10-CM | POA: Diagnosis not present

## 2015-06-20 HISTORY — PX: TOTAL KNEE ARTHROPLASTY: SHX125

## 2015-06-20 SURGERY — ARTHROPLASTY, KNEE, TOTAL
Anesthesia: General | Laterality: Left

## 2015-06-20 MED ORDER — ONDANSETRON HCL 4 MG PO TABS: 4.0000 mg | ORAL_TABLET | Freq: Four times a day (QID) | ORAL | Status: DC | PRN

## 2015-06-20 MED ORDER — FENTANYL CITRATE (PF) 250 MCG/5ML IJ SOLN
INTRAMUSCULAR | Status: AC
  Filled 2015-06-20: qty 5

## 2015-06-20 MED ORDER — PHENYLEPHRINE HCL 10 MG/ML IJ SOLN
10.0000 mg | INTRAMUSCULAR | Status: DC | PRN
Start: 2015-06-20 — End: 2015-06-20
  Administered 2015-06-20: 20 ug/min via INTRAVENOUS

## 2015-06-20 MED ORDER — FENTANYL 25 MCG/HR TD PT72
25.0000 ug | MEDICATED_PATCH | TRANSDERMAL | Status: DC
  Administered 2015-06-20: 25 ug via TRANSDERMAL
  Filled 2015-06-20: qty 1

## 2015-06-20 MED ORDER — MIDAZOLAM HCL 2 MG/2ML IJ SOLN
0.5000 mg | Freq: Once | INTRAMUSCULAR | Status: AC | PRN
  Administered 2015-06-20: 2 mg via INTRAVENOUS

## 2015-06-20 MED ORDER — MIDAZOLAM HCL 2 MG/2ML IJ SOLN
2.0000 mg | Freq: Once | INTRAMUSCULAR | Status: AC
  Administered 2015-06-20: 2 mg via INTRAVENOUS

## 2015-06-20 MED ORDER — PROPOFOL 10 MG/ML IV BOLUS
INTRAVENOUS | Status: DC | PRN
  Administered 2015-06-20: 130 mg via INTRAVENOUS

## 2015-06-20 MED ORDER — POLYETHYLENE GLYCOL 3350 17 G PO PACK: 17.0000 g | PACK | Freq: Every day | ORAL | Status: DC | PRN

## 2015-06-20 MED ORDER — MEPERIDINE HCL 25 MG/ML IJ SOLN: 6.2500 mg | INTRAMUSCULAR | Status: DC | PRN

## 2015-06-20 MED ORDER — SCOPOLAMINE 1 MG/3DAYS TD PT72
MEDICATED_PATCH | TRANSDERMAL | Status: DC | PRN
  Administered 2015-06-20: 1 via TRANSDERMAL

## 2015-06-20 MED ORDER — BUPIVACAINE HCL (PF) 0.5 % IJ SOLN
INTRAMUSCULAR | Status: DC | PRN
  Administered 2015-06-20: 20 mL

## 2015-06-20 MED ORDER — SODIUM CHLORIDE 0.9 % IV SOLN
INTRAVENOUS | Status: DC
  Administered 2015-06-20 – 2015-06-21 (×2): via INTRAVENOUS

## 2015-06-20 MED ORDER — FENTANYL CITRATE (PF) 100 MCG/2ML IJ SOLN
INTRAMUSCULAR | Status: AC
  Administered 2015-06-20: 100 ug via INTRAVENOUS
  Filled 2015-06-20: qty 2

## 2015-06-20 MED ORDER — OXYCODONE HCL 5 MG PO TABS
ORAL_TABLET | ORAL | Status: AC
  Administered 2015-06-20: 10 mg via ORAL
  Filled 2015-06-20: qty 2

## 2015-06-20 MED ORDER — PHENYLEPHRINE 40 MCG/ML (10ML) SYRINGE FOR IV PUSH (FOR BLOOD PRESSURE SUPPORT)
PREFILLED_SYRINGE | INTRAVENOUS | Status: AC
  Filled 2015-06-20: qty 10

## 2015-06-20 MED ORDER — PHENYLEPHRINE HCL 10 MG/ML IJ SOLN
INTRAMUSCULAR | Status: DC | PRN
  Administered 2015-06-20 (×4): 80 ug via INTRAVENOUS
  Administered 2015-06-20: 40 ug via INTRAVENOUS

## 2015-06-20 MED ORDER — DIPHENHYDRAMINE HCL 25 MG PO CAPS: 25.0000 mg | ORAL_CAPSULE | Freq: Four times a day (QID) | ORAL | Status: DC | PRN

## 2015-06-20 MED ORDER — MIDAZOLAM HCL 2 MG/2ML IJ SOLN
INTRAMUSCULAR | Status: AC
  Filled 2015-06-20: qty 2

## 2015-06-20 MED ORDER — ACETAMINOPHEN 650 MG RE SUPP: 650.0000 mg | Freq: Four times a day (QID) | RECTAL | Status: DC | PRN

## 2015-06-20 MED ORDER — ASPIRIN EC 325 MG PO TBEC
325.0000 mg | DELAYED_RELEASE_TABLET | Freq: Two times a day (BID) | ORAL | Status: DC
  Administered 2015-06-20 – 2015-06-22 (×4): 325 mg via ORAL
  Filled 2015-06-20 (×4): qty 1

## 2015-06-20 MED ORDER — ASPIRIN EC 325 MG PO TBEC: 325.0000 mg | DELAYED_RELEASE_TABLET | Freq: Two times a day (BID) | ORAL | Status: DC

## 2015-06-20 MED ORDER — ONDANSETRON HCL 4 MG/2ML IJ SOLN
INTRAMUSCULAR | Status: DC | PRN
  Administered 2015-06-20: 4 mg via INTRAVENOUS

## 2015-06-20 MED ORDER — DOCUSATE SODIUM 100 MG PO CAPS
100.0000 mg | ORAL_CAPSULE | Freq: Two times a day (BID) | ORAL | Status: DC
  Administered 2015-06-20 – 2015-06-22 (×4): 100 mg via ORAL
  Filled 2015-06-20 (×4): qty 1

## 2015-06-20 MED ORDER — DIPHENHYDRAMINE HCL 12.5 MG/5ML PO ELIX
12.5000 mg | ORAL_SOLUTION | ORAL | Status: DC | PRN
  Administered 2015-06-20: 25 mg via ORAL
  Filled 2015-06-20: qty 10

## 2015-06-20 MED ORDER — DEXAMETHASONE SODIUM PHOSPHATE 10 MG/ML IJ SOLN
10.0000 mg | Freq: Two times a day (BID) | INTRAMUSCULAR | Status: AC
  Administered 2015-06-20 – 2015-06-21 (×2): 10 mg via INTRAVENOUS
  Filled 2015-06-20 (×2): qty 1

## 2015-06-20 MED ORDER — MAGNESIUM CITRATE PO SOLN: 1.0000 | Freq: Once | ORAL | Status: DC | PRN

## 2015-06-20 MED ORDER — DEXTROSE 5 % IV SOLN
500.0000 mg | Freq: Four times a day (QID) | INTRAVENOUS | Status: DC | PRN
  Filled 2015-06-20: qty 5

## 2015-06-20 MED ORDER — ONDANSETRON HCL 4 MG/2ML IJ SOLN: 4.0000 mg | Freq: Four times a day (QID) | INTRAMUSCULAR | Status: DC | PRN

## 2015-06-20 MED ORDER — BUPIVACAINE LIPOSOME 1.3 % IJ SUSP
20.0000 mL | INTRAMUSCULAR | Status: DC
  Filled 2015-06-20: qty 20

## 2015-06-20 MED ORDER — SODIUM CHLORIDE 0.9 % IR SOLN
Status: DC | PRN
  Administered 2015-06-20 (×2): 1000 mL

## 2015-06-20 MED ORDER — ONDANSETRON HCL 4 MG/2ML IJ SOLN
INTRAMUSCULAR | Status: AC
  Filled 2015-06-20: qty 2

## 2015-06-20 MED ORDER — METHOCARBAMOL 500 MG PO TABS
500.0000 mg | ORAL_TABLET | Freq: Four times a day (QID) | ORAL | Status: DC | PRN
  Administered 2015-06-21: 500 mg via ORAL
  Filled 2015-06-20: qty 1

## 2015-06-20 MED ORDER — LEVOTHYROXINE SODIUM 50 MCG PO TABS
50.0000 ug | ORAL_TABLET | Freq: Every day | ORAL | Status: DC
  Administered 2015-06-21 – 2015-06-22 (×2): 50 ug via ORAL
  Filled 2015-06-20 (×2): qty 1

## 2015-06-20 MED ORDER — ACETAMINOPHEN 325 MG PO TABS: 650.0000 mg | ORAL_TABLET | Freq: Four times a day (QID) | ORAL | Status: DC | PRN

## 2015-06-20 MED ORDER — OXYCODONE-ACETAMINOPHEN 5-325 MG PO TABS: 1.0000 | ORAL_TABLET | Freq: Four times a day (QID) | ORAL | Status: DC | PRN

## 2015-06-20 MED ORDER — SCOPOLAMINE 1 MG/3DAYS TD PT72
MEDICATED_PATCH | TRANSDERMAL | Status: AC
  Filled 2015-06-20: qty 1

## 2015-06-20 MED ORDER — 0.9 % SODIUM CHLORIDE (POUR BTL) OPTIME
TOPICAL | Status: DC | PRN
  Administered 2015-06-20: 1000 mL

## 2015-06-20 MED ORDER — FENTANYL CITRATE (PF) 100 MCG/2ML IJ SOLN
INTRAMUSCULAR | Status: DC | PRN
  Administered 2015-06-20 (×2): 50 ug via INTRAVENOUS
  Administered 2015-06-20: 100 ug via INTRAVENOUS
  Administered 2015-06-20 (×3): 50 ug via INTRAVENOUS

## 2015-06-20 MED ORDER — BACLOFEN 10 MG PO TABS: 10.0000 mg | ORAL_TABLET | Freq: Three times a day (TID) | ORAL | Status: DC | PRN

## 2015-06-20 MED ORDER — LACTATED RINGERS IV SOLN
INTRAVENOUS | Status: DC
  Administered 2015-06-20 (×3): via INTRAVENOUS

## 2015-06-20 MED ORDER — MIDAZOLAM HCL 2 MG/2ML IJ SOLN
INTRAMUSCULAR | Status: AC
  Administered 2015-06-20: 2 mg via INTRAVENOUS
  Filled 2015-06-20: qty 2

## 2015-06-20 MED ORDER — MIRABEGRON ER 25 MG PO TB24
50.0000 mg | ORAL_TABLET | Freq: Every day | ORAL | Status: DC
Start: 2015-06-21 — End: 2015-06-22
  Administered 2015-06-21 – 2015-06-22 (×2): 50 mg via ORAL
  Filled 2015-06-20 (×2): qty 2

## 2015-06-20 MED ORDER — IRBESARTAN 150 MG PO TABS
150.0000 mg | ORAL_TABLET | Freq: Every day | ORAL | Status: DC
  Administered 2015-06-21 – 2015-06-22 (×3): 150 mg via ORAL
  Filled 2015-06-20 (×2): qty 1

## 2015-06-20 MED ORDER — HYDROMORPHONE HCL 1 MG/ML IJ SOLN
INTRAMUSCULAR | Status: AC
  Administered 2015-06-20: 0.5 mg via INTRAVENOUS
  Filled 2015-06-20: qty 1

## 2015-06-20 MED ORDER — OXYCODONE HCL 5 MG PO TABS
5.0000 mg | ORAL_TABLET | ORAL | Status: DC | PRN
  Administered 2015-06-20 – 2015-06-22 (×9): 10 mg via ORAL
  Filled 2015-06-20 (×9): qty 2

## 2015-06-20 MED ORDER — ZOLPIDEM TARTRATE 5 MG PO TABS
5.0000 mg | ORAL_TABLET | Freq: Every evening | ORAL | Status: DC | PRN
  Administered 2015-06-20: 5 mg via ORAL
  Filled 2015-06-20: qty 1

## 2015-06-20 MED ORDER — HYDROMORPHONE HCL 1 MG/ML IJ SOLN
0.2500 mg | INTRAMUSCULAR | Status: DC | PRN
  Administered 2015-06-20 (×2): 0.5 mg via INTRAVENOUS

## 2015-06-20 MED ORDER — BISACODYL 5 MG PO TBEC: 5.0000 mg | DELAYED_RELEASE_TABLET | Freq: Every day | ORAL | Status: DC | PRN

## 2015-06-20 MED ORDER — HYDROMORPHONE HCL 1 MG/ML IJ SOLN
0.5000 mg | INTRAMUSCULAR | Status: DC | PRN
  Administered 2015-06-20: 1 mg via INTRAVENOUS
  Filled 2015-06-20: qty 1

## 2015-06-20 MED ORDER — BUPIVACAINE LIPOSOME 1.3 % IJ SUSP
INTRAMUSCULAR | Status: DC | PRN
  Administered 2015-06-20: 20 mL

## 2015-06-20 MED ORDER — CEFAZOLIN SODIUM-DEXTROSE 2-3 GM-% IV SOLR
2.0000 g | Freq: Four times a day (QID) | INTRAVENOUS | Status: AC
  Administered 2015-06-20 (×2): 2 g via INTRAVENOUS
  Filled 2015-06-20 (×2): qty 50

## 2015-06-20 MED ORDER — ALUM & MAG HYDROXIDE-SIMETH 200-200-20 MG/5ML PO SUSP: 30.0000 mL | ORAL | Status: DC | PRN

## 2015-06-20 MED ORDER — PROMETHAZINE HCL 25 MG/ML IJ SOLN: 6.2500 mg | INTRAMUSCULAR | Status: DC | PRN

## 2015-06-20 MED ORDER — FENTANYL CITRATE (PF) 100 MCG/2ML IJ SOLN
100.0000 ug | Freq: Once | INTRAMUSCULAR | Status: AC
  Administered 2015-06-20: 100 ug via INTRAVENOUS

## 2015-06-20 MED ORDER — TRANEXAMIC ACID 1000 MG/10ML IV SOLN
1000.0000 mg | INTRAVENOUS | Status: AC
  Administered 2015-06-20: 1000 mg via INTRAVENOUS
  Filled 2015-06-20: qty 10

## 2015-06-20 MED ORDER — BUPIVACAINE-EPINEPHRINE (PF) 0.5% -1:200000 IJ SOLN
INTRAMUSCULAR | Status: AC
  Filled 2015-06-20: qty 30

## 2015-06-20 MED ORDER — BUPIVACAINE-EPINEPHRINE (PF) 0.5% -1:200000 IJ SOLN
INTRAMUSCULAR | Status: DC | PRN
  Administered 2015-06-20: 25 mL via PERINEURAL

## 2015-06-20 MED ORDER — KETOROLAC TROMETHAMINE 15 MG/ML IJ SOLN
7.5000 mg | Freq: Three times a day (TID) | INTRAMUSCULAR | Status: AC
  Administered 2015-06-20 – 2015-06-21 (×3): 7.5 mg via INTRAVENOUS
  Filled 2015-06-20 (×3): qty 1

## 2015-06-20 SURGICAL SUPPLY — 69 items
BANDAGE ESMARK 6X9 LF (GAUZE/BANDAGES/DRESSINGS) ×1 IMPLANT
BENZOIN TINCTURE PRP APPL 2/3 (GAUZE/BANDAGES/DRESSINGS) ×3 IMPLANT
BLADE SAGITTAL 25.0X1.19X90 (BLADE) ×2 IMPLANT
BLADE SAGITTAL 25.0X1.19X90MM (BLADE) ×1
BLADE SAW SAG 90X13X1.27 (BLADE) ×3 IMPLANT
BNDG ESMARK 6X9 LF (GAUZE/BANDAGES/DRESSINGS) ×3
BOWL SMART MIX CTS (DISPOSABLE) ×3 IMPLANT
CAP KNEE TOTAL 3 SIGMA ×3 IMPLANT
CEMENT HV SMART SET (Cement) ×6 IMPLANT
CLOSURE WOUND 1/2 X4 (GAUZE/BANDAGES/DRESSINGS) ×2
COVER SURGICAL LIGHT HANDLE (MISCELLANEOUS) ×3 IMPLANT
CUFF TOURNIQUET SINGLE 34IN LL (TOURNIQUET CUFF) ×3 IMPLANT
CUFF TOURNIQUET SINGLE 44IN (TOURNIQUET CUFF) IMPLANT
DRAPE EXTREMITY T 121X128X90 (DRAPE) ×3 IMPLANT
DRAPE IMP U-DRAPE 54X76 (DRAPES) ×3 IMPLANT
DRAPE U-SHAPE 47X51 STRL (DRAPES) ×3 IMPLANT
DRSG AQUACEL AG ADV 3.5X10 (GAUZE/BANDAGES/DRESSINGS) ×3 IMPLANT
DRSG MEPILEX BORDER 4X12 (GAUZE/BANDAGES/DRESSINGS) ×3 IMPLANT
DRSG PAD ABDOMINAL 8X10 ST (GAUZE/BANDAGES/DRESSINGS) ×3 IMPLANT
DURAPREP 26ML APPLICATOR (WOUND CARE) ×3 IMPLANT
ELECT REM PT RETURN 9FT ADLT (ELECTROSURGICAL) ×3
ELECTRODE REM PT RTRN 9FT ADLT (ELECTROSURGICAL) ×1 IMPLANT
EVACUATOR 1/8 PVC DRAIN (DRAIN) ×6 IMPLANT
FACESHIELD WRAPAROUND (MASK) ×3 IMPLANT
GAUZE SPONGE 4X4 12PLY STRL (GAUZE/BANDAGES/DRESSINGS) ×3 IMPLANT
GLOVE BIOGEL PI IND STRL 8 (GLOVE) ×2 IMPLANT
GLOVE BIOGEL PI INDICATOR 8 (GLOVE) ×4
GLOVE ECLIPSE 7.5 STRL STRAW (GLOVE) ×6 IMPLANT
GOWN STRL REUS W/ TWL LRG LVL3 (GOWN DISPOSABLE) ×1 IMPLANT
GOWN STRL REUS W/ TWL XL LVL3 (GOWN DISPOSABLE) ×2 IMPLANT
GOWN STRL REUS W/TWL LRG LVL3 (GOWN DISPOSABLE) ×2
GOWN STRL REUS W/TWL XL LVL3 (GOWN DISPOSABLE) ×4
HANDPIECE INTERPULSE COAX TIP (DISPOSABLE) ×2
HOOD PEEL AWAY FACE SHEILD DIS (HOOD) ×9 IMPLANT
IMMOBILIZER KNEE 20 (SOFTGOODS) ×3 IMPLANT
IMMOBILIZER KNEE 20 THIGH 36 (SOFTGOODS) ×1 IMPLANT
IMMOBILIZER KNEE 22 UNIV (SOFTGOODS) ×3 IMPLANT
KIT BASIN OR (CUSTOM PROCEDURE TRAY) ×3 IMPLANT
KIT ROOM TURNOVER OR (KITS) ×3 IMPLANT
MANIFOLD NEPTUNE II (INSTRUMENTS) ×3 IMPLANT
NEEDLE SPNL 22GX3.5 QUINCKE BK (NEEDLE) ×3 IMPLANT
NS IRRIG 1000ML POUR BTL (IV SOLUTION) ×3 IMPLANT
PACK TOTAL JOINT (CUSTOM PROCEDURE TRAY) ×3 IMPLANT
PACK UNIVERSAL I (CUSTOM PROCEDURE TRAY) ×3 IMPLANT
PAD ARMBOARD 7.5X6 YLW CONV (MISCELLANEOUS) ×6 IMPLANT
PAD CAST 4YDX4 CTTN HI CHSV (CAST SUPPLIES) ×1 IMPLANT
PADDING CAST COTTON 4X4 STRL (CAST SUPPLIES) ×2
PIN GUIDE THREADED 5/64X9-7MM (PIN) ×6 IMPLANT
PLATE ACE LRG OFST SPDR 20XLWR (Plate) ×1 IMPLANT
PLATE ACE SPIDER (Plate) ×2 IMPLANT
PLATE SPIDER 16 (Washer) ×3 IMPLANT
SCREW LAG CAN CANC 5.0MM 75MM (Screw) ×3 IMPLANT
SCREW LAG CANN CANC 5.0 20X65 (Screw) ×3 IMPLANT
SET HNDPC FAN SPRY TIP SCT (DISPOSABLE) ×1 IMPLANT
SPONGE GAUZE 4X4 12PLY STER LF (GAUZE/BANDAGES/DRESSINGS) ×3 IMPLANT
STAPLER VISISTAT 35W (STAPLE) IMPLANT
STRIP CLOSURE SKIN 1/2X4 (GAUZE/BANDAGES/DRESSINGS) ×4 IMPLANT
SUCTION FRAZIER TIP 10 FR DISP (SUCTIONS) ×3 IMPLANT
SUT MNCRL AB 3-0 PS2 18 (SUTURE) IMPLANT
SUT VIC AB 0 CTB1 27 (SUTURE) ×6 IMPLANT
SUT VIC AB 1 CT1 27 (SUTURE) ×6
SUT VIC AB 1 CT1 27XBRD ANBCTR (SUTURE) ×3 IMPLANT
SUT VIC AB 2-0 CTB1 (SUTURE) ×6 IMPLANT
SYR 50ML LL SCALE MARK (SYRINGE) ×3 IMPLANT
TOWEL OR 17X24 6PK STRL BLUE (TOWEL DISPOSABLE) ×3 IMPLANT
TOWEL OR 17X26 10 PK STRL BLUE (TOWEL DISPOSABLE) ×3 IMPLANT
TRAY FOLEY CATH 16FRSI W/METER (SET/KITS/TRAYS/PACK) IMPLANT
TRAY FOLEY W/METER SILVER 16FR (SET/KITS/TRAYS/PACK) ×3 IMPLANT
WRAP KNEE MAXI GEL POST OP (GAUZE/BANDAGES/DRESSINGS) ×3 IMPLANT

## 2015-06-20 NOTE — Discharge Instructions (Signed)

## 2015-06-20 NOTE — Evaluation (Signed)
Physical Therapy Evaluation Patient Details Name: Jennifer Goodman MRN: [id number] DOB: 1935/02/08 Today's Date: 06/20/2015   History of Present Illness  81 y.o. female s/p left total knee arthroplasty. Hx of thyroid disease, HTN, and anemia.  Clinical Impression  Pt is s/p left TKA resulting in the deficits listed below (see PT Problem List). Motivated to work with therapy. Tolerated stand pivot transfer to chair with min assist post op day #0. Minimal knee instability noted on Lt in weight-bearing. Anticipate she will progress well however will need to be able to navigate a full flight of stairs to return home. Husband can assist at d/c. Pt will benefit from skilled PT to increase their independence and safety with mobility to allow discharge to the venue listed below.      Follow Up Recommendations Home health PT;Supervision for mobility/OOB    Equipment Recommendations  Rolling walker with 5" wheels    Recommendations for Other Services       Precautions / Restrictions Precautions Precautions: Knee;Fall Precaution Booklet Issued: Yes (comment) Precaution Comments: reviewed handout Required Braces or Orthoses: Knee Immobilizer - Left Restrictions Weight Bearing Restrictions: Yes LLE Weight Bearing: Weight bearing as tolerated      Mobility  Bed Mobility Overal bed mobility: Needs Assistance Bed Mobility: Supine to Sit     Supine to sit: Min assist;HOB elevated     General bed mobility comments: Min assist for truncal support to rise to EOB. Vc for technique HOB elevated  Transfers Overall transfer level: Needs assistance Equipment used: Rolling walker (2 wheeled) Transfers: Sit to/from [facility] Sit to Stand: Min assist Stand pivot transfers: Min assist       General transfer comment: Min assist for boost to stand. Leans heavily over RW but without pushing RW. Once upright demonstrates good posture.  VC for hand placement. Able to take small pivotal  steps towards recliner. Tactile cues for Lt knee extension while weight-bearing to activate quad. No overt buckling however demonstrates minor instability of Lt knee. Min assist for walker control with turn.  Ambulation/Gait                Stairs            Wheelchair Mobility    Modified Rankin (Stroke Patients Only)       Balance Overall balance assessment: Needs assistance Sitting-balance support: No upper extremity supported;Feet supported Sitting balance-Leahy Scale: Good     Standing balance support: Single extremity supported Standing balance-Leahy Scale: Poor                               Pertinent Vitals/Pain Pain Assessment: 0-10 Pain Score: 5  Pain Location: Lt knee Pain Descriptors / Indicators: Aching Pain Intervention(s): Monitored during session;Repositioned    Home Living Family/patient expects to be discharged to:: Private residence Living Arrangements: Spouse/significant other Available Help at Discharge: Family;Available 24 hours/day Type of Home: House Home Access: Stairs to enter Entrance Stairs-Rails: Psychiatric nurse of Steps: flight Home Layout: Two level;Bed/bath upstairs Home Equipment: Cane - single point      Prior Function Level of Independence: Independent with assistive device(s)         Comments: cane for mobility. Having most difficulty with sit<>stand. Golfs     Hand Dominance   Dominant Hand: Right    Extremity/Trunk Assessment   Upper Extremity Assessment: Defer to OT evaluation  Lower Extremity Assessment: LLE deficits/detail   LLE Deficits / Details: decreased strength and ROM as expected post op     Communication   Communication: No difficulties  Cognition Arousal/Alertness: Awake/alert Behavior During Therapy: WFL for tasks assessed/performed Overall Cognitive Status: Within Functional Limits for tasks assessed                      General  Comments General comments (skin integrity, edema, etc.): reviewed positioning with use of zero knee to promote optimal healing.    Exercises        Assessment/Plan    PT Assessment Patient needs continued PT services  PT Diagnosis Difficulty walking;Abnormality of gait;Acute pain   PT Problem List Decreased strength;Decreased range of motion;Decreased activity tolerance;Decreased balance;Decreased mobility;Decreased knowledge of use of DME;Decreased knowledge of precautions;Pain  PT Treatment Interventions DME instruction;Gait training;Stair training;Functional mobility training;Therapeutic activities;Therapeutic exercise;Balance training;Neuromuscular re-education;Patient/family education;Modalities   PT Goals (Current goals can be found in the Care Plan section) Acute Rehab PT Goals Patient Stated Goal: go home and play golf PT Goal Formulation: With patient Time For Goal Achievement: 06/27/15 Potential to Achieve Goals: Good    Frequency 7X/week   Barriers to discharge Inaccessible home environment Will need to be able to navigate flight of steps    Co-evaluation               End of Session                 Time: [id number] PT Time Calculation (min) (ACUTE ONLY): 30 min   Charges:   PT Evaluation $PT Eval Moderate Complexity: 1 Procedure PT Treatments $Therapeutic Activity: 8-22 mins   PT G CodesEllouise Newer 06/20/2015, 7:16 PM [name] [place], [place]

## 2015-06-20 NOTE — Transfer of Care (Signed)
Immediate Anesthesia Transfer of Care Note  Patient: Jennifer Goodman  Procedure(s) Performed: Procedure(s): TOTAL KNEE ARTHROPLASTY (Left)  Patient Location: PACU  Anesthesia Type:General  Level of Consciousness: awake, oriented and patient cooperative  Airway & Oxygen Therapy: Patient Spontanous Breathing and Patient connected to nasal cannula oxygen  Post-op Assessment: Report given to RN and Post -op Vital signs reviewed and stable  Post vital signs: Reviewed  Last Vitals:  Filed Vitals:   06/20/15 1025 06/20/15 1315  BP: 150/64 156/77  Pulse: 71 78  Temp:  36.6 C  Resp: 13 22    Complications: No apparent anesthesia complications

## 2015-06-20 NOTE — H&P (Addendum)
TOTAL KNEE ADMISSION H&P  Patient is being admitted for left total knee arthroplasty.  Subjective:  Chief Complaint:left knee pain.  HPI: Jennifer Goodman, 81 y.o. female, has a history of pain and functional disability in the left knee due to arthritis and has failed non-surgical conservative treatments for greater than 12 weeks to includeNSAID's and/or analgesics, corticosteriod injections, viscosupplementation injections, flexibility and strengthening excercises, use of assistive devices and activity modification.  Onset of symptoms was gradual, starting 5 years ago with gradually worsening course since that time. The patient noted prior procedures on the knee to include  arthroscopy and menisectomy on the left knee(s).  Patient currently rates pain in the left knee(s) at 8 out of 10 with activity. Patient has night pain, worsening of pain with activity and weight bearing, pain that interferes with activities of daily living, pain with passive range of motion, crepitus and joint swelling.  Patient has evidence of subchondral sclerosis, periarticular osteophytes and joint space narrowing by imaging studies. This patient has had failure of all reasonable conservative care. There is no active infection.  Patient Active Problem List   Diagnosis Date Noted  . Abdominal pain, RLQ (right lower quadrant) 05/19/2012   Past Medical History  Diagnosis Date  . Thyroid disease   . Hypertension   . Arthritis   . PONV (postoperative nausea and vomiting)   . Hypothyroidism   . Pneumonia   . Anemia   . History of palpitations     over 20 years ago, wore a holter monitor, never had to go back to cardiologist    Past Surgical History  Procedure Laterality Date  . Knee arthroscopy    . Cholecystectomy    . Eye surgery    . Hand tendon surgery      Dr. Amedeo Plenty    No prescriptions prior to admission   Allergies  Allergen Reactions  . Other Nausea Only    antibiotics    Social History  Substance  Use Topics  . Smoking status: Former Smoker    Quit date: 06/14/1978  . Smokeless tobacco: Not on file     Comment: quit 32 yrs ago  . Alcohol Use: Yes     Comment: occ    Family History  Problem Relation Age of Onset  . Cancer Father     colon ca/ lung ca  . Heart disease Father      ROS ROS: I have reviewed the patient's review of systems thoroughly and there are no positive responses as relates to the HPI. Objective:  Physical Exam  Blood pressure 183/79, pulse 73, temperature 97.3 F (36.3 C), temperature source Oral, resp. rate 20, height 5' 2"  (1.575 m), weight 69.4 kg (153 lb), SpO2 99 %. Well-developed well-nourished patient in no acute distress. Alert and oriented x3 HEENT:within normal limits Cardiac: Regular rate and rhythm Pulmonary: Lungs clear to auscultation Abdomen: Soft and nontender.  Normal active bowel sounds  Musculoskeletal: (left knee: Painful range of motion.  Trace effusion.  No instability.  Limited range of motion 5-95. Vital signs in last 24 hours:    Labs: Recent Results (from the past 2160 hour(s))  Lipase, blood     Status: None   Collection Time: 05/05/15  7:54 AM  Result Value Ref Range   Lipase 26 11 - 51 U/L  Comprehensive metabolic panel     Status: Abnormal   Collection Time: 05/05/15  7:54 AM  Result Value Ref Range   Sodium 136 135 - 145  mmol/L   Potassium 3.6 3.5 - 5.1 mmol/L   Chloride 103 101 - 111 mmol/L   CO2 22 22 - 32 mmol/L   Glucose, Bld 108 (H) 65 - 99 mg/dL   BUN 8 6 - 20 mg/dL   Creatinine, Ser 1.04 (H) 0.44 - 1.00 mg/dL   Calcium 8.9 8.9 - 10.3 mg/dL   Total Protein 5.6 (L) 6.5 - 8.1 g/dL   Albumin 3.4 (L) 3.5 - 5.0 g/dL   AST 22 15 - 41 U/L   ALT 19 14 - 54 U/L   Alkaline Phosphatase 52 38 - 126 U/L   Total Bilirubin 0.9 0.3 - 1.2 mg/dL   GFR calc non Af Amer 50 (L) >60 mL/min   GFR calc Af Amer 58 (L) >60 mL/min    Comment: (NOTE) The eGFR has been calculated using the CKD EPI equation. This calculation  has not been validated in all clinical situations. eGFR's persistently <60 mL/min signify possible Chronic Kidney Disease.    Anion gap 11 5 - 15  CBC     Status: Abnormal   Collection Time: 05/05/15  7:54 AM  Result Value Ref Range   WBC 5.4 4.0 - 10.5 K/uL   RBC 3.63 (L) 3.87 - 5.11 MIL/uL   Hemoglobin 12.0 12.0 - 15.0 g/dL   HCT 36.2 36.0 - 46.0 %   MCV 99.7 78.0 - 100.0 fL   MCH 33.1 26.0 - 34.0 pg   MCHC 33.1 30.0 - 36.0 g/dL   RDW 14.9 11.5 - 15.5 %   Platelets 220 150 - 400 K/uL  Urinalysis, Routine w reflex microscopic (not at [facility])     Status: None   Collection Time: 05/05/15 10:13 AM  Result Value Ref Range   Color, Urine YELLOW YELLOW   APPearance CLEAR CLEAR   Specific Gravity, Urine 1.017 1.005 - 1.030   pH 8.0 5.0 - 8.0   Glucose, UA NEGATIVE NEGATIVE mg/dL   Hgb urine dipstick NEGATIVE NEGATIVE   Bilirubin Urine NEGATIVE NEGATIVE   Ketones, ur NEGATIVE NEGATIVE mg/dL   Protein, ur NEGATIVE NEGATIVE mg/dL   Nitrite NEGATIVE NEGATIVE   Leukocytes, UA NEGATIVE NEGATIVE    Comment: MICROSCOPIC NOT DONE ON URINES WITH NEGATIVE PROTEIN, BLOOD, LEUKOCYTES, NITRITE, OR GLUCOSE <1000 mg/dL.  APTT     Status: None   Collection Time: 06/10/15 10:54 AM  Result Value Ref Range   aPTT 32 24 - 37 seconds  CBC WITH DIFFERENTIAL     Status: Abnormal   Collection Time: 06/10/15 10:54 AM  Result Value Ref Range   WBC 7.6 4.0 - 10.5 K/uL   RBC 3.56 (L) 3.87 - 5.11 MIL/uL   Hemoglobin 12.0 12.0 - 15.0 g/dL   HCT 36.3 36.0 - 46.0 %   MCV 102.0 (H) 78.0 - 100.0 fL   MCH 33.7 26.0 - 34.0 pg   MCHC 33.1 30.0 - 36.0 g/dL   RDW 14.0 11.5 - 15.5 %   Platelets 172 150 - 400 K/uL   Neutrophils Relative % 67 %   Neutro Abs 5.0 1.7 - 7.7 K/uL   Lymphocytes Relative 23 %   Lymphs Abs 1.8 0.7 - 4.0 K/uL   Monocytes Relative 7 %   Monocytes Absolute 0.6 0.1 - 1.0 K/uL   Eosinophils Relative 3 %   Eosinophils Absolute 0.3 0.0 - 0.7 K/uL   Basophils Relative 0 %   Basophils  Absolute 0.0 0.0 - 0.1 K/uL  Comprehensive metabolic panel  Status: Abnormal   Collection Time: 06/10/15 10:54 AM  Result Value Ref Range   Sodium 139 135 - 145 mmol/L   Potassium 4.3 3.5 - 5.1 mmol/L   Chloride 103 101 - 111 mmol/L   CO2 26 22 - 32 mmol/L   Glucose, Bld 96 65 - 99 mg/dL   BUN 16 6 - 20 mg/dL   Creatinine, Ser 1.36 (H) 0.44 - 1.00 mg/dL   Calcium 9.6 8.9 - 10.3 mg/dL   Total Protein 6.4 (L) 6.5 - 8.1 g/dL   Albumin 4.0 3.5 - 5.0 g/dL   AST 24 15 - 41 U/L   ALT 14 14 - 54 U/L   Alkaline Phosphatase 60 38 - 126 U/L   Total Bilirubin 1.3 (H) 0.3 - 1.2 mg/dL   GFR calc non Af Amer 36 (L) >60 mL/min   GFR calc Af Amer 42 (L) >60 mL/min    Comment: (NOTE) The eGFR has been calculated using the CKD EPI equation. This calculation has not been validated in all clinical situations. eGFR's persistently <60 mL/min signify possible Chronic Kidney Disease.    Anion gap 10 5 - 15  Protime-INR     Status: None   Collection Time: 06/10/15 10:54 AM  Result Value Ref Range   Prothrombin Time 13.8 11.6 - 15.2 seconds   INR 1.04 0.00 - 1.49  Urinalysis, Routine w reflex microscopic (not at [facility])     Status: Abnormal   Collection Time: 06/10/15 10:54 AM  Result Value Ref Range   Color, Urine YELLOW YELLOW   APPearance CLOUDY (A) CLEAR   Specific Gravity, Urine 1.010 1.005 - 1.030   pH 6.0 5.0 - 8.0   Glucose, UA NEGATIVE NEGATIVE mg/dL   Hgb urine dipstick TRACE (A) NEGATIVE   Bilirubin Urine NEGATIVE NEGATIVE   Ketones, ur NEGATIVE NEGATIVE mg/dL   Protein, ur NEGATIVE NEGATIVE mg/dL   Nitrite NEGATIVE NEGATIVE   Leukocytes, UA MODERATE (A) NEGATIVE  Surgical pcr screen     Status: Abnormal   Collection Time: 06/10/15 10:54 AM  Result Value Ref Range   MRSA, PCR NEGATIVE NEGATIVE   Staphylococcus aureus POSITIVE (A) NEGATIVE    Comment:        The Xpert SA Assay (FDA approved for NASAL specimens in patients over 68 years of age), is one component of a  comprehensive surveillance program.  Test performance has been validated by [facility] for patients greater than or equal to 68 year old. It is not intended to diagnose infection nor to guide or monitor treatment.   Urine microscopic-add on     Status: Abnormal   Collection Time: 06/10/15 10:54 AM  Result Value Ref Range   Squamous Epithelial / LPF 0-5 (A) NONE SEEN   WBC, UA 6-30 0 - 5 WBC/hpf   RBC / HPF 0-5 0 - 5 RBC/hpf   Bacteria, UA RARE (A) NONE SEEN  Type and screen Order type and screen if day of surgery is less than 15 days from draw of preadmission visit or order morning of surgery if day of surgery is greater than 6 days from preadmission visit.     Status: None   Collection Time: 06/10/15 11:10 AM  Result Value Ref Range   ABO/RH(D) A POS    Antibody Screen NEG    Sample Expiration 06/24/2015    Extend sample reason NO TRANSFUSIONS OR PREGNANCY IN THE PAST 3 MONTHS   ABO/Rh     Status: None   Collection  Time: 06/10/15 11:10 AM  Result Value Ref Range   ABO/RH(D) A POS      Estimated body mass index is 25.98 kg/(m^2) as calculated from the following:   Height as of 05/19/12: 5' 4"  (1.626 m).   Weight as of 05/19/12: 68.675 kg (151 lb 6.4 oz).   Imaging Review Plain radiographs demonstrate severe degenerative joint disease of the left knee(s). The overall alignment ismild valgus. The bone quality appears to be fair for age and reported activity level.  Assessment/Plan:  End stage arthritis, left knee   The patient history, physical examination, clinical judgment of the provider and imaging studies are consistent with end stage degenerative joint disease of the left knee(s) and total knee arthroplasty is deemed medically necessary. The treatment options including medical management, injection therapy arthroscopy and arthroplasty were discussed at length. The risks and benefits of total knee arthroplasty were presented and reviewed. The risks due to aseptic loosening,  infection, stiffness, patella tracking problems, thromboembolic complications and other imponderables were discussed. The patient acknowledged the explanation, agreed to proceed with the plan and consent was signed. Patient is being admitted for inpatient treatment for surgery, pain control, PT, OT, prophylactic antibiotics, VTE prophylaxis, progressive ambulation and ADL's and discharge planning. The patient is planning to be discharged home with home health services

## 2015-06-20 NOTE — Anesthesia Procedure Notes (Addendum)
Procedure Name: LMA Insertion Date/Time: 06/20/2015 10:59 AM Performed by: Luciana Axe K Pre-anesthesia Checklist: Patient identified, Emergency Drugs available, Suction available, Patient being monitored and Timeout performed Patient Re-evaluated:Patient Re-evaluated prior to inductionOxygen Delivery Method: Circle system utilized Preoxygenation: Pre-oxygenation with 100% oxygen Intubation Type: IV induction Ventilation: Mask ventilation without difficulty LMA: LMA inserted LMA Size: 4.0 Placement Confirmation: positive ETCO2,  CO2 detector and breath sounds checked- equal and bilateral Tube secured with: Tape Dental Injury: Teeth and Oropharynx as per pre-operative assessment  Comments: Paper tape used to tape eyes and secure LMA.   Anesthesia Regional Block:  Adductor canal block  Pre-Anesthetic Checklist: ,, timeout performed, Correct Patient, Correct Site, Correct Laterality, Correct Procedure, Correct Position, site marked, Risks and benefits discussed,  Surgical consent,  Pre-op evaluation,  At surgeon's request and post-op pain management  Laterality: Left and Lower  Prep: chloraprep       Needles:  Injection technique: Single-shot  Needle Type: Echogenic Stimulator Needle     Needle Length: 9cm 9 cm Needle Gauge: 22 and 22 G    Additional Needles:  Procedures: ultrasound guided (picture in chart) Adductor canal block Narrative:  Start time: 06/20/2015 10:24 AM End time: 06/20/2015 10:29 AM Injection made incrementally with aspirations every 5 mL.  Performed by: Personally  Anesthesiologist: Glennon Mac, Tarvares Lant  Additional Notes: Pt identified in Holding room.  Monitors applied. Working IV access confirmed. Sterile prep, drape L thigh.  #22ga ECHOgenic needle into adductor canal with US guidance.  25cc 0.5% Bupivacaine with 1:200k epi injected incrementally after negative test dose.  Patient asymptomatic, VSS, no heme aspirated, tolerated well.  Jenita Seashore,  MD   Spinal Patient location during procedure: OR Staffing Anesthesiologist: Glennon Mac, Sherril Shipman Performed by: anesthesiologist  Preanesthetic Checklist Completed: patient identified, site marked, surgical consent, pre-op evaluation, timeout performed, IV checked, risks and benefits discussed and monitors and equipment checked Spinal Block Patient position: sitting Prep: Betadine and ChloraPrep Patient monitoring: heart rate, cardiac monitor, continuous pulse ox and blood pressure Approach: midline Location: L3-4 (multiple passes L2-3, L3-4, L4-5) Needle Needle type: Quincke  Needle gauge: 22 G Assessment Events: failed spinal Additional Notes Pt identified in Operating room.  Monitors applied. Working IV access confirmed. Sterile prep, drape Lumbar spine, 1% lido local L3-4.  Unable to enter CSF with #25g Quincke and #22ga Quincke, multiple passes L3-4, L2-3, L4-5. All attempts os. VSS, no heme aspirated, tolerated well.  Aborted  Jenita Seashore, MD

## 2015-06-20 NOTE — Anesthesia Preprocedure Evaluation (Addendum)
Anesthesia Evaluation  Patient identified by MRN, date of birth, ID band Patient awake    Reviewed: Allergy & Precautions, NPO status , Patient's Chart, lab work & pertinent test results  History of Anesthesia Complications (+) PONV and history of anesthetic complications  Airway Mallampati: III  TM Distance: >3 FB Neck ROM: Limited  Mouth opening: Limited Mouth Opening  Dental  (+) Teeth Intact, Dental Advisory Given   Pulmonary former smoker (quit 1980),    breath sounds clear to auscultation       Cardiovascular hypertension, Pt. on medications  Rhythm:Regular Rate:Normal     Neuro/Psych negative neurological ROS     GI/Hepatic negative GI ROS, Neg liver ROS,   Endo/Other  Hypothyroidism   Renal/GU Renal InsufficiencyRenal disease (creat 1.36)     Musculoskeletal  (+) Arthritis , Osteoarthritis and Rheumatoid disorders,    Abdominal   Peds  Hematology   Anesthesia Other Findings   Reproductive/Obstetrics                          Anesthesia Physical Anesthesia Plan  ASA: III  Anesthesia Plan: Spinal   Post-op Pain Management: MAC Combined w/ Regional for Post-op pain   Induction:   Airway Management Planned: Simple Face Mask and Natural Airway  Additional Equipment:   Intra-op Plan:   Post-operative Plan:   Informed Consent: I have reviewed the patients History and Physical, chart, labs and discussed the procedure including the risks, benefits and alternatives for the proposed anesthesia with the patient or authorized representative who has indicated his/her understanding and acceptance.   Dental advisory given  Plan Discussed with: Surgeon and CRNA  Anesthesia Plan Comments: (Plan routine monitors, SAB (GA OK, given severe arthritis), adductor canal block for post op analgesia)        Anesthesia Quick Evaluation

## 2015-06-20 NOTE — Brief Op Note (Signed)
06/20/2015  1:01 PM  PATIENT:  Jennifer Goodman  81 y.o. female  PRE-OPERATIVE DIAGNOSIS:  Left knee degenerative joint disease  POST-OPERATIVE DIAGNOSIS:  Left knee degenerative joint disease  PROCEDURE:  Procedure(s): TOTAL KNEE ARTHROPLASTY (Left)  SURGEON:  Surgeon(s) and Role:    * Dorna Leitz, MD - Primary  PHYSICIAN ASSISTANT:   ASSISTANTS: bethune   ANESTHESIA:   general  EBL:  Total I/O In: 2000 [I.V.:2000] Out: 150 [Urine:75; Blood:75]  BLOOD ADMINISTERED:none  DRAINS: (1 med) Hemovact drain(s) in the l knee with  Suction Open   LOCAL MEDICATIONS USED:  OTHER experel  SPECIMEN:  No Specimen  DISPOSITION OF SPECIMEN:  N/A  COUNTS:  YES  TOURNIQUET:   Total Tourniquet Time Documented: Thigh (Left) - 56 minutes Total: Thigh (Left) - 56 minutes   DICTATION: .Other Dictation: Dictation Number [id number]  PLAN OF CARE: Admit to inpatient   PATIENT DISPOSITION:  PACU - hemodynamically stable.   Delay start of Pharmacological VTE agent (>24hrs) due to surgical blood loss or risk of bleeding: no

## 2015-06-20 NOTE — Progress Notes (Signed)
CRNA at bedside.

## 2015-06-20 NOTE — Progress Notes (Signed)
Orthopedic Tech Progress Note Patient Details:  Jennifer Goodman 27-Jan-1935 [id number]  CPM Left Knee CPM Left Knee: On Left Knee Flexion (Degrees): 60 Left Knee Extension (Degrees): 0 Additional Comments: Trapeze bar and foot roll   [name] 06/20/2015, 2:11 PM

## 2015-06-21 LAB — CBC
HEMATOCRIT: 31.6 % — AB (ref 36.0–46.0)
Hemoglobin: 10.1 g/dL — ABNORMAL LOW (ref 12.0–15.0)
MCH: 31.9 pg (ref 26.0–34.0)
MCHC: 32 g/dL (ref 30.0–36.0)
MCV: 99.7 fL (ref 78.0–100.0)
Platelets: 200 10*3/uL (ref 150–400)
RBC: 3.17 MIL/uL — ABNORMAL LOW (ref 3.87–5.11)
RDW: 13.1 % (ref 11.5–15.5)
WBC: 6.2 10*3/uL (ref 4.0–10.5)

## 2015-06-21 LAB — BASIC METABOLIC PANEL
ANION GAP: 8 (ref 5–15)
BUN: 9 mg/dL (ref 6–20)
CALCIUM: 8.2 mg/dL — AB (ref 8.9–10.3)
CO2: 24 mmol/L (ref 22–32)
Chloride: 103 mmol/L (ref 101–111)
Creatinine, Ser: 1.07 mg/dL — ABNORMAL HIGH (ref 0.44–1.00)
GFR calc Af Amer: 56 mL/min — ABNORMAL LOW (ref >60)
GFR calc non Af Amer: 48 mL/min — ABNORMAL LOW (ref >60)
GLUCOSE: 185 mg/dL — AB (ref 65–99)
POTASSIUM: 4 mmol/L (ref 3.5–5.1)
Sodium: 135 mmol/L (ref 135–145)

## 2015-06-21 NOTE — Progress Notes (Signed)
Physical Therapy Treatment Patient Details Name: Jennifer Goodman MRN: [id number] DOB: 1934-09-15 Today's Date: 06/21/2015    History of Present Illness 81 y.o. female s/p left total knee arthroplasty. Hx of thyroid disease, HTN, and anemia, L rotator cuff tear per pt.     PT Comments    Progressing well with mobility.    Follow Up Recommendations  Home health PT;Supervision for mobility/OOB     Equipment Recommendations  Rolling walker with 5" wheels    Recommendations for Other Services       Precautions / Restrictions Precautions Precautions: Knee;Fall Precaution Booklet Issued: Yes (comment) Precaution Comments: reviewed handout Required Braces or Orthoses: Knee Immobilizer - Left Restrictions Weight Bearing Restrictions: Yes LLE Weight Bearing: Weight bearing as tolerated    Mobility  Bed Mobility Overal bed mobility: Needs Assistance Bed Mobility: Supine to Sit     Supine to sit: Min assist;HOB elevated     General bed mobility comments: Min assist to scoot to EOB. VC for technique HOB elevated  Transfers Overall transfer level: Needs assistance Equipment used: Rolling walker (2 wheeled)   Sit to Stand: Min assist Stand pivot transfers: Min assist       General transfer comment: Cues for safest technique  Ambulation/Gait Ambulation/Gait assistance: Min assist;+2 safety/equipment (second person to follow with chair) Ambulation Distance (Feet): 90 Feet Assistive device: Rolling walker (2 wheeled) Gait Pattern/deviations: Step-to pattern;Decreased stride length     General Gait Details: Pt not wanting to bend either knee with gait at first and was lifting feet about 8 inches off the ground in front of her. Reports that is how someone told her to walk.  Able to correct with VC's     Stairs            Wheelchair Mobility    Modified Rankin (Stroke Patients Only)       Balance                                    Cognition  Arousal/Alertness: Awake/alert Behavior During Therapy: WFL for tasks assessed/performed Overall Cognitive Status: Within Functional Limits for tasks assessed                      Exercises Total Joint Exercises Ankle Circles/Pumps: AROM;Both;10 reps;Supine Quad Sets: AROM;Left;5 reps;Supine Hip ABduction/ADduction: AROM;Left;10 reps;Supine Straight Leg Raises: AROM;Left;10 reps;Supine    General Comments General comments (skin integrity, edema, etc.): No family present.  Pt used BSC with asisst during session, needed assist to wipe.      Pertinent Vitals/Pain Pain Assessment: 0-10 Pain Score: 3  Pain Location: left knee Pain Intervention(s): Limited activity within patient's tolerance;RN gave pain meds during session    Home Living                      Prior Function            PT Goals (current goals can now be found in the care plan section) Acute Rehab PT Goals Patient Stated Goal: go home and play golf PT Goal Formulation: With patient Time For Goal Achievement: 06/27/15 Potential to Achieve Goals: Good Progress towards PT goals: Progressing toward goals    Frequency  7X/week    PT Plan Current plan remains appropriate    Co-evaluation             End of Session Equipment Utilized During Treatment:  Gait belt;Left knee immobilizer Activity Tolerance: Patient tolerated treatment well Patient left: in chair;with call bell/phone within reach     Time: 0824-0905 PT Time Calculation (min) (ACUTE ONLY): 41 min  Charges:  $Gait Training: 23-37 mins $Therapeutic Exercise: 8-22 mins                    G Codes:      Melvern Banker 19-Jul-2015, 10:37 AM  Lavonia Dana, PT  [phone] 2015-07-19

## 2015-06-21 NOTE — Progress Notes (Signed)
Subjective: 06-20-15: L TKA  Sitting up, doing well, tol PO this am.  IVFs still at 100 Reports increased pain with full knee extension in foam device  Objective: Vital signs in last 24 hours: Temp:  [97.8 F (36.6 C)-100 F (37.8 C)] 98.2 F (36.8 C) (01/07 0534) Pulse Rate:  [63-82] 71 (01/07 0534) Resp:  [10-22] 16 (01/07 0534) BP: (115-201)/(62-91) 115/62 mmHg (01/07 0534) SpO2:  [95 %-100 %] 95 % (01/07 0534)  Intake/Output from previous day: 01/06 0701 - 01/07 0700 In: 3308.3 [I.V.:3208.3; IV Piggyback:100] Out: 2055 [Urine:1640; Drains:340; Blood:75] Intake/Output this shift: Total I/O In: 240 [P.O.:240] Out: -    Recent Labs  06/21/15 0446  HGB 10.1*    Recent Labs  06/21/15 0446  WBC 6.2  RBC 3.17*  HCT 31.6*  PLT 200    Recent Labs  06/21/15 0446  NA 135  K 4.0  CL 103  CO2 24  BUN 9  CREATININE 1.07*  GLUCOSE 185*  CALCIUM 8.2*   No results for input(s): LABPT, INR in the last 72 hours.  Sensation intact distally Dorsiflexion/Plantar flexion intact Compartment soft dressing clean and dry externally  Assessment/Plan: Drain d/c'd by me. Continue postop protocol. SL IVFs once good PO PT today Poss D/C home tomorrow with HHPT   Jennifer Goodman A. 06/21/2015, 9:53 AM

## 2015-06-21 NOTE — Op Note (Signed)
NAMESHRAVANI, Jennifer Goodman                 ACCOUNT NO.:  [id number]  MEDICAL RECORD NO.:  [id number]  LOCATION:  [facility]                         FACILITY:  [facility]  PHYSICIAN:  Alta Corning, M.D.   DATE OF BIRTH:  1935-03-06  DATE OF PROCEDURE:  06/20/2015 DATE OF DISCHARGE:                              OPERATIVE REPORT   PREOPERATIVE DIAGNOSIS:  End-stage degenerative joint disease, left knee.  POSTOPERATIVE DIAGNOSES: 1. End-stage degenerative joint disease, left knee. 2. Medial condyle fracture of the femur.  PROCEDURE: 1. Total knee replacement with a Sigma system, size 3 femur, size 3     tibia, 10-mm bridging bearing, and a 35-mm all poly patella. 2. Open reduction and internal fixation of medial condyle fracture in     distal femur.  SURGEONS:  Alta Corning, M.D.  ASSISTANT:  Gary Fleet, P.A.  ANESTHESIA:  General.  BRIEF HISTORY:  Jennifer Goodman is a 82 year old female, with severe complaints of left knee pain.  She had been treated conservatively for prolonged period of time after failure of all conservative care, and x- rays showing bone-on-bone change.  She was taken to the operating room for total knee replacement.  Preoperative imaging showed bone-on-bone change, and she was having night pain and light activity pain.  DESCRIPTION OF PROCEDURE:  The patient was brought to the operating room.  After adequate anesthesia was obtained with general anesthetic, the patient was placed supine on the operating room table.  The left leg was prepped and draped in usual sterile fashion.  Following this, the leg was exsanguinated.  Blood pressure tourniquet inflated to 300 mmHg. Following this, a midline incision was made in the subcutaneous tissues down the level of the extensor mechanism.  Medial parapatellar arthrotomy was undertaken.  Following this, the retropatellar fat pad was removed, synovium from the anterior aspect of the femur, medial and lateral meniscus, and anterior  and posterior cruciates.  Following this, attention was turned to the femur when intramedullary pilot hole was drilled and 10 mm of distal femur was resected along with a 5-degree valgus inclination.  Following this, it was sized to a 3, anterior and posterior cuts were made, chamfers and box.  Attention was then turned to the tibia which was cut perpendicular to the long axis.  Trial components were put in place after the rotational alignment of the tibia was set with a keel and it was drilled and keeled.  She was noted to be severely osteoporotic, and we contemplated doing an MBT revision tray given her age and size.  In fact, we had good coverage on the tibia, we thought we were okay.  At this point, we went to the patella, cut it down to a level of 13 mm, put a 35 paddle on and lugs were drilled for the patella and trial was placed.  Range of motion was put.  With the trial, I could kind of see that the medial condyle for reasons that are completely unclear with a periosteal sleeve with kind of flapping open. The bone itself looked to be intact underneath, but there was certainly a layer where there was fluid that was coming from the bone  off that medial side.  At this point, we felt that the appropriate course of action was to proceed with total knee replacement and once we had stabilized the end of the bone, we felt that probably putting a couple of screws and with washers might be smart.  We wanted to evaluate further, so we went on.  At this point, we irrigated thoroughly, suctioned dry, and then cemented in a size 3 tibia, size 3 femur, a 10- mm bridging bearing trial, and a 35 all poly patella, held with a clamp. Once the cement was completely hardened, and we would kind of held the knee perfectly still while the cement was hardening, we turned to the medial side  and again there was something that looked like a demyelination of the medial condyle happening.  We went ahead and  got some 5.0 cannulated screws with large spider washers and put two of these over the medial condyle which gave great purchase to this sort of avulse, sort of sleeve __________ on the medial side.  I got it anatomically reduced and held it in the anatomic position, got excellent purchase on the lateral distal femur with these cannulated screws.  At this point, we put her through a range of motion.  She was completely stable, and at this point, we put a medium Hemovac drain in.  We used 20 mL of Exparel throughout the knee for postoperative pain control.  The patient at this time had the medial parapatellar arthrotomy closed with 1 Vicryl running, the all poly trial was removed, and the final poly placed.  Prior to that and then the skin was closed with 0 and 2-0 Vicryl and 3-0 Monocryl subcuticular.  Benzoin and Steri-Strips were applied.  Sterile compressive dressing was applied.  The patient was taken to the recovery room where she was noted to be in satisfactory condition.  Estimated blood loss for the procedure was minimal. Estimated tourniquet time was 55 minutes.  The final can be gotten from the anesthetic record.     Alta Corning, M.D.     Corliss Skains  D:  06/20/2015  T:  06/21/2015  Job:  [id number]  cc:   Dr. Berenice Primas' office

## 2015-06-21 NOTE — Progress Notes (Signed)
Physical Therapy Treatment Patient Details Name: Jennifer Goodman MRN: [id number] DOB: 1935/03/01 Today's Date: 06/21/2015    History of Present Illness 81 y.o. female s/p left total knee arthroplasty. Hx of thyroid disease, HTN, and anemia, L rotator cuff tear per pt.     PT Comments    Pt progressing well with mobility.  Able to negotiate stairs today.  Pt hopeful for DC home tomorrow.    Follow Up Recommendations  Home health PT;Supervision for mobility/OOB     Equipment Recommendations  Rolling walker with 5" wheels    Recommendations for Other Services       Precautions / Restrictions Precautions Precautions: Knee;Fall Precaution Booklet Issued: Yes (comment) Precaution Comments: reviewed handout Required Braces or Orthoses: Knee Immobilizer - Left Restrictions Weight Bearing Restrictions: Yes LLE Weight Bearing: Weight bearing as tolerated    Mobility  Bed Mobility               General bed mobility comments: Pt up in chair  Transfers Overall transfer level: Needs assistance Equipment used: Rolling walker (2 wheeled) Transfers: Sit to/from Stand Sit to Stand: Mod assist (From recliner)         General transfer comment: Cues for safest technique. Pt with difficulty standing from recliner. She reports this is baseline due to her left rotator cuff tear and r wrist fusion.   Ambulation/Gait Ambulation/Gait assistance: Min assist Ambulation Distance (Feet): 5 Feet (Focus was on stair training this session ) Assistive device: Rolling walker (2 wheeled) Gait Pattern/deviations: Step-to pattern;Decreased stride length         Stairs Stairs: Yes Stairs assistance: Min assist Stair Management: Two rails;Forwards;Step to pattern Number of Stairs: 3 (performed twice) General stair comments: no knee bucking with stairs. Pt able to recall technique after initial PT training  Wheelchair Mobility    Modified Rankin (Stroke Patients Only)       Balance                                     Cognition Arousal/Alertness: Awake/alert Behavior During Therapy: WFL for tasks assessed/performed Overall Cognitive Status: Within Functional Limits for tasks assessed                      Exercises Total Joint Exercises Ankle Circles/Pumps: AROM;Both;10 reps;Supine Quad Sets: AROM;Left;5 reps;Supine Heel Slides: AAROM;Left;5 reps;Supine Hip ABduction/ADduction: AROM;Left;10 reps;Supine Straight Leg Raises: AROM;Left;10 reps;Supine Long Arc Quad: AAROM;Left;10 reps;Seated Knee Flexion: AAROM;Left;5 reps;Seated Goniometric ROM: 96 degrees AAROM in flexion    General Comments General comments (skin integrity, edema, etc.): No family present for session.  Pt was pleased with her ability to go up/down the stairs.      Pertinent Vitals/Pain Pain Assessment: 0-10 Pain Score: 3  Pain Location: left knee Pain Intervention(s): Limited activity within patient's tolerance;Monitored during session    Home Living                      Prior Function            PT Goals (current goals can now be found in the care plan section) Acute Rehab PT Goals Patient Stated Goal: go home and play golf PT Goal Formulation: With patient Time For Goal Achievement: 06/27/15 Potential to Achieve Goals: Good Progress towards PT goals: Progressing toward goals    Frequency  7X/week    PT Plan Current plan remains appropriate  Co-evaluation             End of Session Equipment Utilized During Treatment: Gait belt;Left knee immobilizer Activity Tolerance: Patient tolerated treatment well Patient left: in chair;with call bell/phone within reach     Time: 1150-1216 PT Time Calculation (min) (ACUTE ONLY): 26 min  Charges:  $Gait Training: 8-22 mins $Therapeutic Exercise: 8-22 mins                    G Codes:      Jennifer Goodman Jul 08, 2015, 3:10 PM  Jennifer Goodman, PT  [phone] Jul 08, 2015

## 2015-06-22 LAB — CBC
HEMATOCRIT: 29 % — AB (ref 36.0–46.0)
HEMOGLOBIN: 9.3 g/dL — AB (ref 12.0–15.0)
MCH: 32 pg (ref 26.0–34.0)
MCHC: 32.1 g/dL (ref 30.0–36.0)
MCV: 99.7 fL (ref 78.0–100.0)
Platelets: 199 10*3/uL (ref 150–400)
RBC: 2.91 MIL/uL — AB (ref 3.87–5.11)
RDW: 13.3 % (ref 11.5–15.5)
WBC: 8.5 10*3/uL (ref 4.0–10.5)

## 2015-06-22 NOTE — Progress Notes (Signed)
Physical Therapy Treatment Patient Details Name: Jennifer Goodman MRN: [id number] DOB: 05/11/1935 Today's Date: 06/22/2015    History of Present Illness 81 y.o. female s/p left total knee arthroplasty. Hx of thyroid disease, HTN, and anemia, L rotator cuff tear per pt.     PT Comments    Pt improved independence with mobility this session, has assist at home, DME ordered, HHPT planned and no questions or concerns re: d/c to home.  Verbally updated RN, pt ready to d/c.   Follow Up Recommendations  Home health PT;Supervision for mobility/OOB     Equipment Recommendations  Rolling walker with 5" wheels    Recommendations for Other Services       Precautions / Restrictions Precautions Precautions: Knee;Fall Restrictions Weight Bearing Restrictions: Yes LLE Weight Bearing: Weight bearing as tolerated Other Position/Activity Restrictions: do not prop pillow behind left knee    Mobility  Bed Mobility Overal bed mobility:  (standing EOB on arrival, denies difficulty)             General bed mobility comments: to chair after session  Transfers Overall transfer level: Needs assistance Equipment used: Rolling walker (2 wheeled) Transfers: Sit to/from Stand Sit to Stand: Supervision         General transfer comment: cues for safest hands  Ambulation/Gait Ambulation/Gait assistance: Modified independent (Device/Increase time) Ambulation Distance (Feet): 150 Feet Assistive device: Rolling walker (2 wheeled) Gait Pattern/deviations: Step-through pattern;Decreased stance time - left;Decreased step length - left   Gait velocity interpretation: Below normal speed for age/gender General Gait Details: improved gait mechanics this session, with mild decr stance time operated side, otherwise no difficulties or issues   Science writer    Modified Rankin (Stroke Patients Only)       Balance Overall balance assessment: Modified  Independent Sitting-balance support: No upper extremity supported;Feet supported Sitting balance-Leahy Scale: Good     Standing balance support: No upper extremity supported;During functional activity Standing balance-Leahy Scale: Fair                      Cognition Arousal/Alertness: Awake/alert Behavior During Therapy: WFL for tasks assessed/performed Overall Cognitive Status: Within Functional Limits for tasks assessed                      Exercises Total Joint Exercises Ankle Circles/Pumps: AROM;Both;10 reps;Supine Quad Sets: AROM;Left;5 reps;Supine Long Arc Quad: AAROM;Left;10 reps;Seated Knee Flexion: AAROM;Left;5 reps;Seated    General Comments        Pertinent Vitals/Pain Pain Assessment: 0-10 Pain Score: 2     Home Living                      Prior Function            PT Goals (current goals can now be found in the care plan section) Progress towards PT goals: Goals met/education completed, patient discharged from PT    Frequency       PT Plan Current plan remains appropriate    Co-evaluation             End of Session Equipment Utilized During Treatment: Gait belt Activity Tolerance: Patient tolerated treatment well Patient left: in chair;with call bell/phone within reach     Time: 5465-0354 PT Time Calculation (min) (ACUTE ONLY): 29 min  Charges:  $Therapeutic Exercise: 8-22 mins $Therapeutic Activity: 8-22 mins  G Codes:      Herbie Drape 06/22/2015, 10:38 AM

## 2015-06-22 NOTE — Care Management Note (Signed)
Case Management Note  Patient Details  Name: BRYANNE GANGI MRN: [id number] Date of Birth: Apr 28, 1935  Subjective/Objective:  81 y.o. F admitted 06/20/2015 for  L TKA. To be discharged home with spouse Today.                  Action/Plan: DME: RW ordered through [facility] and should be delivered to room momentarily.  Anticipate discharge home afterwards. Pt and spouse believe HHPT has already been arranged but are unsure of agency. They are aware of how to contact CM office should they need assist after discharge.  No further CM needs but will be available should additional discharge needs arise.   Expected Discharge Date:                  Expected Discharge Plan:  Home/Self Care  In-House Referral:     Discharge planning Services  CM Consult  Post Acute Care Choice:    Choice offered to:     DME Arranged:  Walker rolling DME Agency:  [place]:    [place]:     Status of Service:  Completed, signed off  Medicare Important Message Given:    Date Medicare IM Given:    Medicare IM give by:    Date Additional Medicare IM Given:    Additional Medicare Important Message give by:     If discussed at [place] of Stay Meetings, dates discussed:    Additional Comments:  Delrae Sawyers, RN 06/22/2015, 2:51 PM

## 2015-06-22 NOTE — Care Management Note (Signed)
Case Management Note  Patient Details  Name: Jennifer Goodman MRN: [id number] Date of Birth: 02-03-1935  Subjective/Objective:  81 yr old female s/p left total knee arthroplasty.                   Action/Plan: Case manager spoke with patient and her husband concerning home health and DME needs at discharge. Patient was preoperatively setup with [facility] [place], no changes.. Mr. Montilla states they have 3in1 but will need rolling walker. CM has ordered walker. CM confirmed that patient is to be serviced by [place]. Patient will have family support at discharge.    Expected Discharge Date:   06/22/15               Expected Discharge Plan:  [place]  In-House Referral:     Discharge planning Services  CM Consult  Post Acute Care Choice:  Durable Medical Equipment, Home Health Choice offered to:  Patient  DME Arranged:  Walker rolling DME Agency:  [place] Arranged:    [place]:  [place]  Status of Service:  Completed, signed off  Medicare Important Message Given:    Date Medicare IM Given:    Medicare IM give by:    Date Additional Medicare IM Given:    Additional Medicare Important Message give by:     If discussed at [place] of Stay Meetings, dates discussed:    Additional Comments:  Ninfa Meeker, RN 06/22/2015, 2:53 PM

## 2015-06-22 NOTE — Progress Notes (Signed)
Subjective: 06-20-15: L TKA  Sitting up, doing well, tol PO. IV SL now.   Objective: Vital signs in last 24 hours: Temp:  [98.4 F (36.9 C)-98.7 F (37.1 C)] 98.4 F (36.9 C) (01/08 0459) Pulse Rate:  [71-84] 75 (01/08 0459) Resp:  [18] 18 (01/08 0459) BP: (116-143)/(50-61) 135/61 mmHg (01/08 0459) SpO2:  [97 %-99 %] 97 % (01/08 0459)  Intake/Output from previous day: 01/07 0701 - 01/08 0700 In: 700 [P.O.:700] Out: 200 [Urine:200] Intake/Output this shift:     Recent Labs  06/21/15 0446 06/22/15 0353  HGB 10.1* 9.3*    Recent Labs  06/21/15 0446 06/22/15 0353  WBC 6.2 8.5  RBC 3.17* 2.91*  HCT 31.6* 29.0*  PLT 200 199    Recent Labs  06/21/15 0446  NA 135  K 4.0  CL 103  CO2 24  BUN 9  CREATININE 1.07*  GLUCOSE 185*  CALCIUM 8.2*   No results for input(s): LABPT, INR in the last 72 hours.  Sensation intact distally Dorsiflexion/Plantar flexion intact Compartment soft dressing clean and dry externally  Assessment/Plan: D/C today with HHPT if clears PT.   Rider Ermis A. 06/22/2015, 9:59 AM

## 2015-06-23 ENCOUNTER — Encounter (HOSPITAL_COMMUNITY): Payer: Self-pay | Admitting: Orthopedic Surgery

## 2015-06-23 DIAGNOSIS — Z471 Aftercare following joint replacement surgery: Secondary | ICD-10-CM | POA: Diagnosis not present

## 2015-06-23 DIAGNOSIS — Z96652 Presence of left artificial knee joint: Secondary | ICD-10-CM | POA: Diagnosis not present

## 2015-06-23 DIAGNOSIS — Z7982 Long term (current) use of aspirin: Secondary | ICD-10-CM | POA: Diagnosis not present

## 2015-06-23 DIAGNOSIS — I10 Essential (primary) hypertension: Secondary | ICD-10-CM | POA: Diagnosis not present

## 2015-06-23 DIAGNOSIS — M1990 Unspecified osteoarthritis, unspecified site: Secondary | ICD-10-CM | POA: Diagnosis not present

## 2015-06-23 DIAGNOSIS — D649 Anemia, unspecified: Secondary | ICD-10-CM | POA: Diagnosis not present

## 2015-06-23 DIAGNOSIS — E039 Hypothyroidism, unspecified: Secondary | ICD-10-CM | POA: Diagnosis not present

## 2015-06-23 NOTE — Discharge Summary (Signed)
Patient ID: Jennifer Goodman MRN: [id number] DOB/AGE: Jul 10, 1934 81 y.o.  Admit date: 06/20/2015 Discharge date: 06/22/2015  Admission Diagnoses:  Principal Problem:   Primary osteoarthritis of left knee Active Problems:   Osteoporosis   Closed displaced fracture of medial condyle of left femur [facility])   Discharge Diagnoses:  Same  Past Medical History  Diagnosis Date  . Thyroid disease   . Hypertension   . Arthritis   . PONV (postoperative nausea and vomiting)   . Hypothyroidism   . Pneumonia   . Anemia   . History of palpitations     over 20 years ago, wore a holter monitor, never had to go back to cardiologist    Surgeries: Procedure(s):left [place] on 06/20/2015   Consultants:  none  Discharged Condition: Improved  Hospital Course: Jennifer Goodman is an 81 y.o. female who was admitted 06/20/2015 for operative treatment ofPrimary osteoarthritis of left knee. Patient has severe unremitting pain that affects sleep, daily activities, and work/hobbies. After pre-op clearance the patient was taken to the operating room on 06/20/2015 and underwent  Procedure(s):LEFT TOTAL KNEE ARTHROPLASTY.    Patient was given perioperative antibiotics: Anti-infectives    Start     Dose/Rate Route Frequency Ordered Stop   06/20/15 1730  ceFAZolin (ANCEF) IVPB 2 g/50 mL premix     2 g 100 mL/hr over 30 Minutes Intravenous Every 6 hours 06/20/15 1631 06/20/15 2320   06/20/15 0800  ceFAZolin (ANCEF) IVPB 2 g/50 mL premix     2 g 100 mL/hr over 30 Minutes Intravenous To ShortStay Surgical 06/19/15 1319 06/20/15 1100       Patient was given sequential compression devices, early ambulation, and chemoprophylaxis to prevent DVT.She progressed well with physical therapy. On the day of discharge she ambulated 150 feet with physical therapy.  She was afebrile and she was taking oral pain medication for pain management.  Patient benefited maximally from hospital stay and there were no  complications.    Recent vital signs: see chart for details.   Recent laboratory studies:  Recent Labs  06/21/15 0446 06/22/15 0353  WBC 6.2 8.5  HGB 10.1* 9.3*  HCT 31.6* 29.0*  PLT 200 199  NA 135  --   K 4.0  --   CL 103  --   CO2 24  --   BUN 9  --   CREATININE 1.07*  --   GLUCOSE 185*  --   CALCIUM 8.2*  --      Discharge Medications:     Medication List    STOP taking these medications        etanercept 50 MG/ML injection  Commonly known as:  ENBREL     HYDROcodone-acetaminophen 5-325 MG tablet  Commonly known as:  NORCO/VICODIN     methotrexate 2.5 MG tablet  Commonly known as:  RHEUMATREX      TAKE these medications        aspirin EC 325 MG tablet  Take 1 tablet (325 mg total) by mouth 2 (two) times daily after a meal. Take x 1 month post op to decrease risk of blood clots.     baclofen 10 MG tablet  Commonly known as:  LIORESAL  Take 1 tablet (10 mg total) by mouth every 8 (eight) hours as needed for muscle spasms.     cyanocobalamin 1000 MCG/ML injection  Commonly known as:  (VITAMIN B-12)  Inject 1,000 mcg into the muscle every 30 (thirty) days.  fentaNYL 25 MCG/HR patch  Commonly known as:  [place] - dosed mcg/hr  Place 1 patch onto the skin every 3 (three) days.     folic acid 1 MG tablet  Commonly known as:  FOLVITE  Take 1 mg by mouth daily.     gabapentin 100 MG capsule  Commonly known as:  NEURONTIN  Take 300 mg by mouth at bedtime as needed.     irbesartan 150 MG tablet  Commonly known as:  AVAPRO  Take 150 mg by mouth daily.     levothyroxine 50 MCG tablet  Commonly known as:  SYNTHROID, LEVOTHROID  Take 50 mcg by mouth daily before breakfast.     MYRBETRIQ 50 MG Tb24 tablet  Generic drug:  mirabegron ER  Take 50 mg by mouth daily.     oxyCODONE-acetaminophen 5-325 MG tablet  Commonly known as:  PERCOCET/ROXICET  Take 1-2 tablets by mouth every 6 (six) hours as needed for severe pain.     ULTRAFLORA IMMUNE HEALTH  PO  Take 1 capsule by mouth daily.     Vitamin D3 5000 units Tabs  Take 1 tablet by mouth daily.        Diagnostic Studies: Dg Chest 2 View  06/10/2015  CLINICAL DATA:  Preop left knee replacement.  Former smoker. EXAM: CHEST  2 VIEW COMPARISON:  CT abdomen and pelvis 05/05/2015 FINDINGS: The cardiomediastinal silhouette is within normal limits. Thoracic aortic calcification is noted. A few small calcified granulomas are noted, largest in the medial right base. No confluent airspace opacity, edema, pleural effusion, or pneumothorax is identified. There is mild thoracolumbar dextroscoliosis. Right upper quadrant abdominal surgical clips are noted. IMPRESSION: No active cardiopulmonary disease. Electronically Signed   By: Logan Bores M.D.   On: 06/10/2015 11:56    Disposition: 01-Home or Self Care      Discharge Instructions    Weight bearing as tolerated    Complete by:  As directed   Laterality:  left  Extremity:  Lower           Follow-up Information    Follow up with GRAVES,JOHN L, MD. Schedule an appointment as soon as possible for a visit in 2 weeks.   Specialty:  Orthopedic Surgery   Contact information:   [place] [address] [phone]       Follow up with [facility].   Why:  RW will be delivered to your hospital room prior to discharge   Contact information:   [address] [place] [address] [phone]       Follow up with [facility].   Why:  Someone from [facility] will contact you concerning start date and time for therapy.   Contact information:   [address] [place] [address] [phone]        Signed: Erlene Senters 06/23/2015, 10:26 AM

## 2015-06-24 DIAGNOSIS — Z7982 Long term (current) use of aspirin: Secondary | ICD-10-CM | POA: Diagnosis not present

## 2015-06-24 DIAGNOSIS — E039 Hypothyroidism, unspecified: Secondary | ICD-10-CM | POA: Diagnosis not present

## 2015-06-24 DIAGNOSIS — I10 Essential (primary) hypertension: Secondary | ICD-10-CM | POA: Diagnosis not present

## 2015-06-24 DIAGNOSIS — Z471 Aftercare following joint replacement surgery: Secondary | ICD-10-CM | POA: Diagnosis not present

## 2015-06-24 DIAGNOSIS — Z96652 Presence of left artificial knee joint: Secondary | ICD-10-CM | POA: Diagnosis not present

## 2015-06-24 DIAGNOSIS — D649 Anemia, unspecified: Secondary | ICD-10-CM | POA: Diagnosis not present

## 2015-06-24 DIAGNOSIS — M1990 Unspecified osteoarthritis, unspecified site: Secondary | ICD-10-CM | POA: Diagnosis not present

## 2015-06-26 DIAGNOSIS — Z96652 Presence of left artificial knee joint: Secondary | ICD-10-CM | POA: Diagnosis not present

## 2015-06-26 DIAGNOSIS — I10 Essential (primary) hypertension: Secondary | ICD-10-CM | POA: Diagnosis not present

## 2015-06-26 DIAGNOSIS — Z471 Aftercare following joint replacement surgery: Secondary | ICD-10-CM | POA: Diagnosis not present

## 2015-06-26 DIAGNOSIS — Z7982 Long term (current) use of aspirin: Secondary | ICD-10-CM | POA: Diagnosis not present

## 2015-06-26 DIAGNOSIS — D649 Anemia, unspecified: Secondary | ICD-10-CM | POA: Diagnosis not present

## 2015-06-26 DIAGNOSIS — M1990 Unspecified osteoarthritis, unspecified site: Secondary | ICD-10-CM | POA: Diagnosis not present

## 2015-06-26 DIAGNOSIS — E039 Hypothyroidism, unspecified: Secondary | ICD-10-CM | POA: Diagnosis not present

## 2015-06-27 ENCOUNTER — Encounter (HOSPITAL_COMMUNITY): Payer: Self-pay

## 2015-06-27 ENCOUNTER — Emergency Department (HOSPITAL_COMMUNITY)
Admission: EM | Admit: 2015-06-27 | Discharge: 2015-06-27 | Disposition: A | Discharge status: Home / Self Care | Payer: Medicare Other | Attending: Emergency Medicine | Admitting: Emergency Medicine

## 2015-06-27 DIAGNOSIS — Z862 Personal history of diseases of the blood and blood-forming organs and certain disorders involving the immune mechanism: Secondary | ICD-10-CM | POA: Insufficient documentation

## 2015-06-27 DIAGNOSIS — M1990 Unspecified osteoarthritis, unspecified site: Secondary | ICD-10-CM | POA: Insufficient documentation

## 2015-06-27 DIAGNOSIS — Z8701 Personal history of pneumonia (recurrent): Secondary | ICD-10-CM | POA: Insufficient documentation

## 2015-06-27 DIAGNOSIS — Z79899 Other long term (current) drug therapy: Secondary | ICD-10-CM | POA: Diagnosis not present

## 2015-06-27 DIAGNOSIS — I10 Essential (primary) hypertension: Secondary | ICD-10-CM | POA: Diagnosis not present

## 2015-06-27 DIAGNOSIS — E039 Hypothyroidism, unspecified: Secondary | ICD-10-CM | POA: Insufficient documentation

## 2015-06-27 DIAGNOSIS — M545 Low back pain, unspecified: Secondary | ICD-10-CM

## 2015-06-27 DIAGNOSIS — Z87891 Personal history of nicotine dependence: Secondary | ICD-10-CM | POA: Insufficient documentation

## 2015-06-27 MED ORDER — HYDROMORPHONE HCL 1 MG/ML IJ SOLN
1.0000 mg | Freq: Once | INTRAMUSCULAR | Status: AC
  Administered 2015-06-27: 1 mg via INTRAMUSCULAR
  Filled 2015-06-27: qty 1

## 2015-06-27 MED ORDER — HYDROMORPHONE HCL 2 MG PO TABS: 2.0000 mg | ORAL_TABLET | Freq: Four times a day (QID) | ORAL | Status: DC | PRN

## 2015-06-27 NOTE — ED Notes (Signed)
PA Riley at bedside.  

## 2015-06-27 NOTE — Discharge Instructions (Signed)
Ms. Jennifer Goodman,  Nice meeting you! Please follow-up with your orthopedic doctor and primary care provider. Return to the emergency department if you develop fevers, chills, loss of bladder/bowel control. Feel better soon!  S. Wendie Simmer, PA-C  Back Pain, Adult Back pain is very common in adults.The cause of back pain is rarely dangerous and the pain often gets better over time.The cause of your back pain may not be known. Some common causes of back pain include:  Strain of the muscles or ligaments supporting the spine.  Wear and tear (degeneration) of the spinal disks.  Arthritis.  Direct injury to the back. For many people, back pain may return. Since back pain is rarely dangerous, most people can learn to manage this condition on their own. HOME CARE INSTRUCTIONS Watch your back pain for any changes. The following actions may help to lessen any discomfort you are feeling:  Remain active. It is stressful on your back to sit or stand in one place for long periods of time. Do not sit, drive, or stand in one place for more than 30 minutes at a time. Take short walks on even surfaces as soon as you are able.Try to increase the length of time you walk each day.  Exercise regularly as directed by your health care provider. Exercise helps your back heal faster. It also helps avoid future injury by keeping your muscles strong and flexible.  Do not stay in bed.Resting more than 1-2 days can delay your recovery.  Pay attention to your body when you bend and lift. The most comfortable positions are those that put less stress on your recovering back. Always use proper lifting techniques, including:  Bending your knees.  Keeping the load close to your body.  Avoiding twisting.  Find a comfortable position to sleep. Use a firm mattress and lie on your side with your knees slightly bent. If you lie on your back, put a pillow under your knees.  Avoid feeling anxious or stressed.Stress  increases muscle tension and can worsen back pain.It is important to recognize when you are anxious or stressed and learn ways to manage it, such as with exercise.  Take medicines only as directed by your health care provider. Over-the-counter medicines to reduce pain and inflammation are often the most helpful.Your health care provider may prescribe muscle relaxant drugs.These medicines help dull your pain so you can more quickly return to your normal activities and healthy exercise.  Apply ice to the injured area:  Put ice in a plastic bag.  Place a towel between your skin and the bag.  Leave the ice on for 20 minutes, 2-3 times a day for the first 2-3 days. After that, ice and heat may be alternated to reduce pain and spasms.  Maintain a healthy weight. Excess weight puts extra stress on your back and makes it difficult to maintain good posture. SEEK MEDICAL CARE IF:  You have pain that is not relieved with rest or medicine.  You have increasing pain going down into the legs or buttocks.  You have pain that does not improve in one week.  You have night pain.  You lose weight.  You have a fever or chills. SEEK IMMEDIATE MEDICAL CARE IF:   You develop new bowel or bladder control problems.  You have unusual weakness or numbness in your arms or legs.  You develop nausea or vomiting.  You develop abdominal pain.  You feel faint.   This information is not intended  to replace advice given to you by your health care provider. Make sure you discuss any questions you have with your health care provider.   Document Released: 05/31/2005 Document Revised: 06/21/2014 Document Reviewed: 10/02/2013 Elsevier Interactive Patient Education [employer].

## 2015-06-27 NOTE — ED Notes (Signed)
Pt getting dressed awaiting discharge paperwork at bedside.

## 2015-06-27 NOTE — ED Provider Notes (Signed)
CSN: [id number]     Arrival date & time 06/27/15  0850 History   First MD Initiated Contact with Patient 06/27/15 0912     Chief Complaint  Patient presents with  . Back Pain   HPI  Jennifer Goodman is a 81 y.o. F PMH significant for hypothyroidism, HTN, arthritis presenting with back pain since last Friday. She states she was given an epidural, heard the CRNA tell someone else that she has tried four different times but has been unsuccessful due to patient's arthritis. She denies fevers, chills, N/V, bladder/bowel incontinence.    Past Medical History  Diagnosis Date  . Thyroid disease   . Hypertension   . Arthritis   . PONV (postoperative nausea and vomiting)   . Hypothyroidism   . Pneumonia   . Anemia   . History of palpitations     over 20 years ago, wore a holter monitor, never had to go back to cardiologist   Past Surgical History  Procedure Laterality Date  . Knee arthroscopy    . Cholecystectomy    . Eye surgery    . Hand tendon surgery      Dr. Amedeo Plenty  . Total knee arthroplasty Left 06/20/2015  . Total knee arthroplasty Left 06/20/2015    Procedure: TOTAL KNEE ARTHROPLASTY;  Surgeon: Dorna Leitz, MD;  Location: [facility];  Service: Orthopedics;  Laterality: Left;   Family History  Problem Relation Age of Onset  . Cancer Father     colon ca/ lung ca  . Heart disease Father    Social History  Substance Use Topics  . Smoking status: Former Smoker    Quit date: 06/14/1978  . Smokeless tobacco: Never Used     Comment: quit 32 yrs ago  . Alcohol Use: Yes     Comment: occ   OB History    No data available     Review of Systems  Ten systems are reviewed and are negative for acute change except as noted in the HPI  Allergies  Other  Home Medications   Prior to Admission medications   Medication Sig Start Date End Date Taking? Authorizing Provider  aspirin EC 325 MG tablet Take 1 tablet (325 mg total) by mouth 2 (two) times daily after a meal. Take x 1 month post  op to decrease risk of blood clots. 06/20/15   Gary Fleet, PA-C  baclofen (LIORESAL) 10 MG tablet Take 1 tablet (10 mg total) by mouth every 8 (eight) hours as needed for muscle spasms. 06/20/15   Gary Fleet, PA-C  Cholecalciferol (VITAMIN D3) 5000 UNITS TABS Take 1 tablet by mouth daily.    Historical Provider, MD  cyanocobalamin (,VITAMIN B-12,) 1000 MCG/ML injection Inject 1,000 mcg into the muscle every 30 (thirty) days.    Historical Provider, MD  fentaNYL (DURAGESIC - DOSED MCG/HR) 25 MCG/HR Place 1 patch onto the skin every 3 (three) days.    Historical Provider, MD  folic acid (FOLVITE) 1 MG tablet Take 1 mg by mouth daily.    Historical Provider, MD  gabapentin (NEURONTIN) 100 MG capsule Take 300 mg by mouth at bedtime as needed.     Historical Provider, MD  irbesartan (AVAPRO) 150 MG tablet Take 150 mg by mouth daily.     Historical Provider, MD  levothyroxine (SYNTHROID, LEVOTHROID) 50 MCG tablet Take 50 mcg by mouth daily before breakfast.    Historical Provider, MD  mirabegron ER (MYRBETRIQ) 50 MG TB24 tablet Take 50 mg by mouth daily.  Historical Provider, MD  oxyCODONE-acetaminophen (PERCOCET/ROXICET) 5-325 MG tablet Take 1-2 tablets by mouth every 6 (six) hours as needed for severe pain. 06/20/15   Gary Fleet, PA-C  Probiotic Product ([place] PO) Take 1 capsule by mouth daily.     Historical Provider, MD   BP 150/59 mmHg  Pulse 73  Temp(Src) 97.5 F (36.4 C) (Oral)  Resp 16  SpO2 98% Physical Exam  Constitutional: She is oriented to person, place, and time. She appears well-developed and well-nourished. No distress.  HENT:  Head: Normocephalic and atraumatic.  Eyes: Conjunctivae are normal. Right eye exhibits no discharge. Left eye exhibits no discharge. No scleral icterus.  Neck: Normal range of motion. No tracheal deviation present.  Cardiovascular: Normal rate, regular rhythm, normal heart sounds and intact distal pulses.  Exam reveals no gallop and  no friction rub.   No murmur heard. Pulmonary/Chest: Effort normal and breath sounds normal. No respiratory distress. She has no wheezes. She has no rales. She exhibits no tenderness.  Abdominal: Soft. Bowel sounds are normal. She exhibits no distension and no mass. There is no tenderness. There is no rebound and no guarding.  Musculoskeletal: Normal range of motion. She exhibits tenderness. She exhibits no edema.  Lumbar spine with four epidural entry sites. No erythema, drainage, significant bruising noted. Slight tenderness along lumbar spine  Lymphadenopathy:    She has no cervical adenopathy.  Neurological: She is alert and oriented to person, place, and time. No cranial nerve deficit. Coordination normal.  Skin: Skin is warm and dry. No rash noted. She is not diaphoretic. No erythema.  Psychiatric: She has a normal mood and affect. Her behavior is normal.  Nursing note and vitals reviewed.   ED Course  Procedures   MDM   Final diagnoses:  Midline low back pain without sciatica   Patient non-toxic appearing and VSS. Most likely post-op pain. Do not suspect cauda equina or infectious etiology. Will treat pain today with dilaudid.  Patient feeling better upon reassessment. Patient may be safely discharged home with PO dilaudid. Discussed reasons for return. Patient to follow-up with primary care provider within one week. Patient in understanding and agreement with the plan.   Florence Lions, PA-C 06/29/15 0825  Milton Ferguson, MD 07/01/15 [phone]

## 2015-06-27 NOTE — ED Notes (Signed)
Patient needed to go to the bathroom. I unhooked her, walked her to the bathroom and told her to pull the cord when she was through and I would come and get her. I went to clean room 7 and when I was done I saw Tommi Rumps walking her in the hallway and she was very upset with me. She complained I left her there. I reminded her about the cord but she said there was no soap or paper towels and I left t her, she said I soul have waited outside for her.

## 2015-06-27 NOTE — ED Notes (Signed)
Patient here with left knee replacement last Friday, now having extreme waves of back pain that she thinks is related to epidural attempts, denies trauma

## 2015-06-27 NOTE — ED Notes (Addendum)
Pt reports back pain since she had knee surgery last Friday. Pt denies any loss of bladder or bowel, denies numbness or tingling in any limb, pt able to ambulate with no issue. Pt has equal sensation bilaterally.

## 2015-06-30 DIAGNOSIS — E039 Hypothyroidism, unspecified: Secondary | ICD-10-CM | POA: Diagnosis not present

## 2015-06-30 DIAGNOSIS — Z471 Aftercare following joint replacement surgery: Secondary | ICD-10-CM | POA: Diagnosis not present

## 2015-06-30 DIAGNOSIS — N390 Urinary tract infection, site not specified: Secondary | ICD-10-CM | POA: Diagnosis not present

## 2015-06-30 DIAGNOSIS — Z96652 Presence of left artificial knee joint: Secondary | ICD-10-CM | POA: Diagnosis not present

## 2015-06-30 DIAGNOSIS — Z7982 Long term (current) use of aspirin: Secondary | ICD-10-CM | POA: Diagnosis not present

## 2015-06-30 DIAGNOSIS — I10 Essential (primary) hypertension: Secondary | ICD-10-CM | POA: Diagnosis not present

## 2015-06-30 DIAGNOSIS — D649 Anemia, unspecified: Secondary | ICD-10-CM | POA: Diagnosis not present

## 2015-06-30 DIAGNOSIS — M1990 Unspecified osteoarthritis, unspecified site: Secondary | ICD-10-CM | POA: Diagnosis not present

## 2015-06-30 DIAGNOSIS — M545 Low back pain: Secondary | ICD-10-CM | POA: Diagnosis not present

## 2015-06-30 DIAGNOSIS — M25562 Pain in left knee: Secondary | ICD-10-CM | POA: Diagnosis not present

## 2015-07-02 DIAGNOSIS — Z96652 Presence of left artificial knee joint: Secondary | ICD-10-CM | POA: Diagnosis not present

## 2015-07-02 DIAGNOSIS — M1990 Unspecified osteoarthritis, unspecified site: Secondary | ICD-10-CM | POA: Diagnosis not present

## 2015-07-02 DIAGNOSIS — Z7982 Long term (current) use of aspirin: Secondary | ICD-10-CM | POA: Diagnosis not present

## 2015-07-02 DIAGNOSIS — D649 Anemia, unspecified: Secondary | ICD-10-CM | POA: Diagnosis not present

## 2015-07-02 DIAGNOSIS — I10 Essential (primary) hypertension: Secondary | ICD-10-CM | POA: Diagnosis not present

## 2015-07-02 DIAGNOSIS — Z471 Aftercare following joint replacement surgery: Secondary | ICD-10-CM | POA: Diagnosis not present

## 2015-07-02 DIAGNOSIS — E039 Hypothyroidism, unspecified: Secondary | ICD-10-CM | POA: Diagnosis not present

## 2015-07-03 NOTE — Anesthesia Postprocedure Evaluation (Signed)
Anesthesia Post Note  Patient: Jennifer Goodman  Procedure(s) Performed: Procedure(s) (LRB): TOTAL KNEE ARTHROPLASTY (Left)  Patient location during evaluation: PACU Anesthesia Type: General, Spinal and Combined General/Spinal Level of consciousness: awake and alert, patient cooperative and oriented Pain management: pain level controlled Vital Signs Assessment: post-procedure vital signs reviewed and stable Respiratory status: spontaneous breathing, nonlabored ventilation, respiratory function stable and patient connected to nasal cannula oxygen Cardiovascular status: blood pressure returned to baseline and stable Postop Assessment: no signs of nausea or vomiting, patient able to bend at knees, spinal receding and no headache Anesthetic complications: no    Last Vitals:  Filed Vitals:   06/21/15 1929 06/22/15 0459  BP: 143/55 135/61  Pulse: 71 75  Temp: 37.1 C 36.9 C  Resp: 18 18    Last Pain:  Filed Vitals:   06/23/15 1354  PainSc: 3                  Khrystyna Schwalm,E. Anfernee Peschke

## 2015-07-04 DIAGNOSIS — E039 Hypothyroidism, unspecified: Secondary | ICD-10-CM | POA: Diagnosis not present

## 2015-07-04 DIAGNOSIS — Z85828 Personal history of other malignant neoplasm of skin: Secondary | ICD-10-CM | POA: Diagnosis not present

## 2015-07-04 DIAGNOSIS — Z7982 Long term (current) use of aspirin: Secondary | ICD-10-CM | POA: Diagnosis not present

## 2015-07-04 DIAGNOSIS — D649 Anemia, unspecified: Secondary | ICD-10-CM | POA: Diagnosis not present

## 2015-07-04 DIAGNOSIS — M1990 Unspecified osteoarthritis, unspecified site: Secondary | ICD-10-CM | POA: Diagnosis not present

## 2015-07-04 DIAGNOSIS — Z471 Aftercare following joint replacement surgery: Secondary | ICD-10-CM | POA: Diagnosis not present

## 2015-07-04 DIAGNOSIS — I788 Other diseases of capillaries: Secondary | ICD-10-CM | POA: Diagnosis not present

## 2015-07-04 DIAGNOSIS — D692 Other nonthrombocytopenic purpura: Secondary | ICD-10-CM | POA: Diagnosis not present

## 2015-07-04 DIAGNOSIS — L821 Other seborrheic keratosis: Secondary | ICD-10-CM | POA: Diagnosis not present

## 2015-07-04 DIAGNOSIS — I10 Essential (primary) hypertension: Secondary | ICD-10-CM | POA: Diagnosis not present

## 2015-07-04 DIAGNOSIS — Z96652 Presence of left artificial knee joint: Secondary | ICD-10-CM | POA: Diagnosis not present

## 2015-07-04 DIAGNOSIS — L570 Actinic keratosis: Secondary | ICD-10-CM | POA: Diagnosis not present

## 2015-07-07 DIAGNOSIS — M25662 Stiffness of left knee, not elsewhere classified: Secondary | ICD-10-CM | POA: Diagnosis not present

## 2015-07-07 DIAGNOSIS — M25562 Pain in left knee: Secondary | ICD-10-CM | POA: Diagnosis not present

## 2015-07-07 DIAGNOSIS — Z96652 Presence of left artificial knee joint: Secondary | ICD-10-CM | POA: Diagnosis not present

## 2015-07-08 DIAGNOSIS — Z96652 Presence of left artificial knee joint: Secondary | ICD-10-CM | POA: Diagnosis not present

## 2015-07-08 DIAGNOSIS — M25562 Pain in left knee: Secondary | ICD-10-CM | POA: Diagnosis not present

## 2015-07-08 DIAGNOSIS — M25662 Stiffness of left knee, not elsewhere classified: Secondary | ICD-10-CM | POA: Diagnosis not present

## 2015-07-10 DIAGNOSIS — M25562 Pain in left knee: Secondary | ICD-10-CM | POA: Diagnosis not present

## 2015-07-10 DIAGNOSIS — M25662 Stiffness of left knee, not elsewhere classified: Secondary | ICD-10-CM | POA: Diagnosis not present

## 2015-07-10 DIAGNOSIS — Z96652 Presence of left artificial knee joint: Secondary | ICD-10-CM | POA: Diagnosis not present

## 2015-07-14 DIAGNOSIS — E538 Deficiency of other specified B group vitamins: Secondary | ICD-10-CM | POA: Diagnosis not present

## 2015-07-15 DIAGNOSIS — Z96652 Presence of left artificial knee joint: Secondary | ICD-10-CM | POA: Diagnosis not present

## 2015-07-15 DIAGNOSIS — M25562 Pain in left knee: Secondary | ICD-10-CM | POA: Diagnosis not present

## 2015-07-15 DIAGNOSIS — M25662 Stiffness of left knee, not elsewhere classified: Secondary | ICD-10-CM | POA: Diagnosis not present

## 2015-07-17 DIAGNOSIS — Z96652 Presence of left artificial knee joint: Secondary | ICD-10-CM | POA: Diagnosis not present

## 2015-07-17 DIAGNOSIS — M25662 Stiffness of left knee, not elsewhere classified: Secondary | ICD-10-CM | POA: Diagnosis not present

## 2015-07-17 DIAGNOSIS — M25562 Pain in left knee: Secondary | ICD-10-CM | POA: Diagnosis not present

## 2015-07-22 DIAGNOSIS — Z96652 Presence of left artificial knee joint: Secondary | ICD-10-CM | POA: Diagnosis not present

## 2015-07-22 DIAGNOSIS — M25662 Stiffness of left knee, not elsewhere classified: Secondary | ICD-10-CM | POA: Diagnosis not present

## 2015-07-22 DIAGNOSIS — M25562 Pain in left knee: Secondary | ICD-10-CM | POA: Diagnosis not present

## 2015-07-24 DIAGNOSIS — M25562 Pain in left knee: Secondary | ICD-10-CM | POA: Diagnosis not present

## 2015-07-24 DIAGNOSIS — M25662 Stiffness of left knee, not elsewhere classified: Secondary | ICD-10-CM | POA: Diagnosis not present

## 2015-07-24 DIAGNOSIS — Z96652 Presence of left artificial knee joint: Secondary | ICD-10-CM | POA: Diagnosis not present

## 2015-07-29 DIAGNOSIS — Z96652 Presence of left artificial knee joint: Secondary | ICD-10-CM | POA: Diagnosis not present

## 2015-07-29 DIAGNOSIS — M25562 Pain in left knee: Secondary | ICD-10-CM | POA: Diagnosis not present

## 2015-07-29 DIAGNOSIS — M25662 Stiffness of left knee, not elsewhere classified: Secondary | ICD-10-CM | POA: Diagnosis not present

## 2015-07-31 DIAGNOSIS — M25662 Stiffness of left knee, not elsewhere classified: Secondary | ICD-10-CM | POA: Diagnosis not present

## 2015-07-31 DIAGNOSIS — M25562 Pain in left knee: Secondary | ICD-10-CM | POA: Diagnosis not present

## 2015-07-31 DIAGNOSIS — Z96652 Presence of left artificial knee joint: Secondary | ICD-10-CM | POA: Diagnosis not present

## 2015-08-07 DIAGNOSIS — M25562 Pain in left knee: Secondary | ICD-10-CM | POA: Diagnosis not present

## 2015-08-07 DIAGNOSIS — Z96652 Presence of left artificial knee joint: Secondary | ICD-10-CM | POA: Diagnosis not present

## 2015-08-07 DIAGNOSIS — M25662 Stiffness of left knee, not elsewhere classified: Secondary | ICD-10-CM | POA: Diagnosis not present

## 2015-08-13 DIAGNOSIS — E538 Deficiency of other specified B group vitamins: Secondary | ICD-10-CM | POA: Diagnosis not present

## 2015-08-27 DIAGNOSIS — Z79899 Other long term (current) drug therapy: Secondary | ICD-10-CM | POA: Diagnosis not present

## 2015-08-27 DIAGNOSIS — M0589 Other rheumatoid arthritis with rheumatoid factor of multiple sites: Secondary | ICD-10-CM | POA: Diagnosis not present

## 2015-08-27 DIAGNOSIS — G894 Chronic pain syndrome: Secondary | ICD-10-CM | POA: Diagnosis not present

## 2015-08-27 DIAGNOSIS — M150 Primary generalized (osteo)arthritis: Secondary | ICD-10-CM | POA: Diagnosis not present

## 2015-09-09 DIAGNOSIS — Z96652 Presence of left artificial knee joint: Secondary | ICD-10-CM | POA: Diagnosis not present

## 2015-09-09 DIAGNOSIS — Z9889 Other specified postprocedural states: Secondary | ICD-10-CM | POA: Diagnosis not present

## 2015-09-16 DIAGNOSIS — E538 Deficiency of other specified B group vitamins: Secondary | ICD-10-CM | POA: Diagnosis not present

## 2015-09-24 DIAGNOSIS — M8580 Other specified disorders of bone density and structure, unspecified site: Secondary | ICD-10-CM | POA: Diagnosis not present

## 2015-09-24 DIAGNOSIS — I129 Hypertensive chronic kidney disease with stage 1 through stage 4 chronic kidney disease, or unspecified chronic kidney disease: Secondary | ICD-10-CM | POA: Diagnosis not present

## 2015-09-24 DIAGNOSIS — E538 Deficiency of other specified B group vitamins: Secondary | ICD-10-CM | POA: Diagnosis not present

## 2015-10-01 DIAGNOSIS — F17211 Nicotine dependence, cigarettes, in remission: Secondary | ICD-10-CM | POA: Diagnosis not present

## 2015-10-01 DIAGNOSIS — D709 Neutropenia, unspecified: Secondary | ICD-10-CM | POA: Diagnosis not present

## 2015-10-01 DIAGNOSIS — N183 Chronic kidney disease, stage 3 (moderate): Secondary | ICD-10-CM | POA: Diagnosis not present

## 2015-10-01 DIAGNOSIS — E039 Hypothyroidism, unspecified: Secondary | ICD-10-CM | POA: Diagnosis not present

## 2015-10-01 DIAGNOSIS — I129 Hypertensive chronic kidney disease with stage 1 through stage 4 chronic kidney disease, or unspecified chronic kidney disease: Secondary | ICD-10-CM | POA: Diagnosis not present

## 2015-10-17 DIAGNOSIS — E538 Deficiency of other specified B group vitamins: Secondary | ICD-10-CM | POA: Diagnosis not present

## 2015-10-21 DIAGNOSIS — H26493 Other secondary cataract, bilateral: Secondary | ICD-10-CM | POA: Diagnosis not present

## 2015-10-21 DIAGNOSIS — M25562 Pain in left knee: Secondary | ICD-10-CM | POA: Diagnosis not present

## 2015-10-21 DIAGNOSIS — Z96652 Presence of left artificial knee joint: Secondary | ICD-10-CM | POA: Diagnosis not present

## 2015-11-18 DIAGNOSIS — E538 Deficiency of other specified B group vitamins: Secondary | ICD-10-CM | POA: Diagnosis not present

## 2015-11-27 DIAGNOSIS — M0589 Other rheumatoid arthritis with rheumatoid factor of multiple sites: Secondary | ICD-10-CM | POA: Diagnosis not present

## 2015-11-27 DIAGNOSIS — M150 Primary generalized (osteo)arthritis: Secondary | ICD-10-CM | POA: Diagnosis not present

## 2015-11-27 DIAGNOSIS — G894 Chronic pain syndrome: Secondary | ICD-10-CM | POA: Diagnosis not present

## 2015-11-27 DIAGNOSIS — Z79899 Other long term (current) drug therapy: Secondary | ICD-10-CM | POA: Diagnosis not present

## 2015-11-27 DIAGNOSIS — M7989 Other specified soft tissue disorders: Secondary | ICD-10-CM | POA: Diagnosis not present

## 2015-12-22 DIAGNOSIS — E538 Deficiency of other specified B group vitamins: Secondary | ICD-10-CM | POA: Diagnosis not present

## 2016-01-05 DIAGNOSIS — N3946 Mixed incontinence: Secondary | ICD-10-CM | POA: Diagnosis not present

## 2016-01-05 DIAGNOSIS — R350 Frequency of micturition: Secondary | ICD-10-CM | POA: Diagnosis not present

## 2016-01-06 DIAGNOSIS — L821 Other seborrheic keratosis: Secondary | ICD-10-CM | POA: Diagnosis not present

## 2016-01-06 DIAGNOSIS — Z85828 Personal history of other malignant neoplasm of skin: Secondary | ICD-10-CM | POA: Diagnosis not present

## 2016-01-06 DIAGNOSIS — L570 Actinic keratosis: Secondary | ICD-10-CM | POA: Diagnosis not present

## 2016-01-06 DIAGNOSIS — L720 Epidermal cyst: Secondary | ICD-10-CM | POA: Diagnosis not present

## 2016-01-23 DIAGNOSIS — E538 Deficiency of other specified B group vitamins: Secondary | ICD-10-CM | POA: Diagnosis not present

## 2016-02-24 DIAGNOSIS — E538 Deficiency of other specified B group vitamins: Secondary | ICD-10-CM | POA: Diagnosis not present

## 2016-02-27 DIAGNOSIS — M25561 Pain in right knee: Secondary | ICD-10-CM | POA: Diagnosis not present

## 2016-02-27 DIAGNOSIS — G894 Chronic pain syndrome: Secondary | ICD-10-CM | POA: Diagnosis not present

## 2016-02-27 DIAGNOSIS — Z79899 Other long term (current) drug therapy: Secondary | ICD-10-CM | POA: Diagnosis not present

## 2016-02-27 DIAGNOSIS — M150 Primary generalized (osteo)arthritis: Secondary | ICD-10-CM | POA: Diagnosis not present

## 2016-02-27 DIAGNOSIS — M0589 Other rheumatoid arthritis with rheumatoid factor of multiple sites: Secondary | ICD-10-CM | POA: Diagnosis not present

## 2016-03-16 DIAGNOSIS — M1711 Unilateral primary osteoarthritis, right knee: Secondary | ICD-10-CM | POA: Diagnosis not present

## 2016-03-23 DIAGNOSIS — M1711 Unilateral primary osteoarthritis, right knee: Secondary | ICD-10-CM | POA: Diagnosis not present

## 2016-03-26 DIAGNOSIS — E538 Deficiency of other specified B group vitamins: Secondary | ICD-10-CM | POA: Diagnosis not present

## 2016-03-26 DIAGNOSIS — Z23 Encounter for immunization: Secondary | ICD-10-CM | POA: Diagnosis not present

## 2016-03-30 DIAGNOSIS — Z1389 Encounter for screening for other disorder: Secondary | ICD-10-CM | POA: Diagnosis not present

## 2016-03-30 DIAGNOSIS — M8580 Other specified disorders of bone density and structure, unspecified site: Secondary | ICD-10-CM | POA: Diagnosis not present

## 2016-03-30 DIAGNOSIS — Z23 Encounter for immunization: Secondary | ICD-10-CM | POA: Diagnosis not present

## 2016-03-30 DIAGNOSIS — M1711 Unilateral primary osteoarthritis, right knee: Secondary | ICD-10-CM | POA: Diagnosis not present

## 2016-03-30 DIAGNOSIS — I129 Hypertensive chronic kidney disease with stage 1 through stage 4 chronic kidney disease, or unspecified chronic kidney disease: Secondary | ICD-10-CM | POA: Diagnosis not present

## 2016-03-30 DIAGNOSIS — E538 Deficiency of other specified B group vitamins: Secondary | ICD-10-CM | POA: Diagnosis not present

## 2016-03-30 DIAGNOSIS — M859 Disorder of bone density and structure, unspecified: Secondary | ICD-10-CM | POA: Diagnosis not present

## 2016-03-30 DIAGNOSIS — Z0000 Encounter for general adult medical examination without abnormal findings: Secondary | ICD-10-CM | POA: Diagnosis not present

## 2016-04-06 DIAGNOSIS — M1711 Unilateral primary osteoarthritis, right knee: Secondary | ICD-10-CM | POA: Diagnosis not present

## 2016-04-07 ENCOUNTER — Other Ambulatory Visit: Payer: Self-pay | Admitting: Orthopedic Surgery

## 2016-04-09 DIAGNOSIS — Z0000 Encounter for general adult medical examination without abnormal findings: Secondary | ICD-10-CM | POA: Diagnosis not present

## 2016-04-09 DIAGNOSIS — I10 Essential (primary) hypertension: Secondary | ICD-10-CM | POA: Diagnosis not present

## 2016-04-13 DIAGNOSIS — M1711 Unilateral primary osteoarthritis, right knee: Secondary | ICD-10-CM | POA: Diagnosis not present

## 2016-04-28 DIAGNOSIS — E538 Deficiency of other specified B group vitamins: Secondary | ICD-10-CM | POA: Diagnosis not present

## 2016-04-29 DIAGNOSIS — Z1231 Encounter for screening mammogram for malignant neoplasm of breast: Secondary | ICD-10-CM | POA: Diagnosis not present

## 2016-05-11 DIAGNOSIS — M1711 Unilateral primary osteoarthritis, right knee: Secondary | ICD-10-CM | POA: Diagnosis not present

## 2016-05-14 ENCOUNTER — Other Ambulatory Visit: Payer: Self-pay | Admitting: Orthopedic Surgery

## 2016-05-18 ENCOUNTER — Encounter (HOSPITAL_COMMUNITY)
Admission: RE | Admit: 2016-05-18 | Discharge: 2016-05-18 | Disposition: A | Discharge status: Home / Self Care | Payer: Medicare Other | Source: Ambulatory Visit | Attending: Orthopedic Surgery | Admitting: Orthopedic Surgery

## 2016-05-18 ENCOUNTER — Encounter (HOSPITAL_COMMUNITY): Payer: Self-pay

## 2016-05-18 DIAGNOSIS — M1711 Unilateral primary osteoarthritis, right knee: Secondary | ICD-10-CM | POA: Insufficient documentation

## 2016-05-18 DIAGNOSIS — Z01812 Encounter for preprocedural laboratory examination: Secondary | ICD-10-CM | POA: Insufficient documentation

## 2016-05-18 HISTORY — DX: Dorsalgia, unspecified: M54.9

## 2016-05-18 HISTORY — DX: Pain in unspecified joint: M25.50

## 2016-05-18 HISTORY — DX: Frequency of micturition: R35.0

## 2016-05-18 HISTORY — DX: Diverticulosis of intestine, part unspecified, without perforation or abscess without bleeding: K57.90

## 2016-05-18 HISTORY — DX: Personal history of colonic polyps: Z86.010

## 2016-05-18 HISTORY — DX: Urgency of urination: R39.15

## 2016-05-18 HISTORY — DX: Personal history of colon polyps, unspecified: Z86.0100

## 2016-05-18 LAB — TYPE AND SCREEN
ABO/RH(D): A POS
Antibody Screen: NEGATIVE

## 2016-05-18 LAB — URINALYSIS, ROUTINE W REFLEX MICROSCOPIC
Bilirubin Urine: NEGATIVE
GLUCOSE, UA: NEGATIVE mg/dL
Ketones, ur: NEGATIVE mg/dL
Nitrite: NEGATIVE
PROTEIN: NEGATIVE mg/dL
SPECIFIC GRAVITY, URINE: 1.012 (ref 1.005–1.030)
pH: 5 (ref 5.0–8.0)

## 2016-05-18 LAB — COMPREHENSIVE METABOLIC PANEL
ALBUMIN: 4 g/dL (ref 3.5–5.0)
ALK PHOS: 60 U/L (ref 38–126)
ALT: 19 U/L (ref 14–54)
AST: 32 U/L (ref 15–41)
Anion gap: 6 (ref 5–15)
BUN: 11 mg/dL (ref 6–20)
CALCIUM: 9.7 mg/dL (ref 8.9–10.3)
CHLORIDE: 106 mmol/L (ref 101–111)
CO2: 26 mmol/L (ref 22–32)
CREATININE: 1.14 mg/dL — AB (ref 0.44–1.00)
GFR calc Af Amer: 52 mL/min — ABNORMAL LOW (ref >60)
GFR calc non Af Amer: 45 mL/min — ABNORMAL LOW (ref >60)
GLUCOSE: 104 mg/dL — AB (ref 65–99)
Potassium: 4.5 mmol/L (ref 3.5–5.1)
SODIUM: 138 mmol/L (ref 135–145)
Total Bilirubin: 1.3 mg/dL — ABNORMAL HIGH (ref 0.3–1.2)
Total Protein: 6.4 g/dL — ABNORMAL LOW (ref 6.5–8.1)

## 2016-05-18 LAB — CBC WITH DIFFERENTIAL/PLATELET
BASOS ABS: 0 10*3/uL (ref 0.0–0.1)
Basophils Relative: 0 %
EOS ABS: 0.1 10*3/uL (ref 0.0–0.7)
EOS PCT: 3 %
HCT: 39.3 % (ref 36.0–46.0)
HEMOGLOBIN: 12.9 g/dL (ref 12.0–15.0)
LYMPHS ABS: 1.2 10*3/uL (ref 0.7–4.0)
Lymphocytes Relative: 25 %
MCH: 33.2 pg (ref 26.0–34.0)
MCHC: 32.8 g/dL (ref 30.0–36.0)
MCV: 101 fL — ABNORMAL HIGH (ref 78.0–100.0)
Monocytes Absolute: 0.5 10*3/uL (ref 0.1–1.0)
Monocytes Relative: 10 %
NEUTROS PCT: 62 %
Neutro Abs: 2.9 10*3/uL (ref 1.7–7.7)
PLATELETS: 203 10*3/uL (ref 150–400)
RBC: 3.89 MIL/uL (ref 3.87–5.11)
RDW: 14.7 % (ref 11.5–15.5)
WBC: 4.7 10*3/uL (ref 4.0–10.5)

## 2016-05-18 LAB — SURGICAL PCR SCREEN
MRSA, PCR: NEGATIVE
Staphylococcus aureus: NEGATIVE

## 2016-05-18 LAB — PROTIME-INR
INR: 0.99
Prothrombin Time: 13.1 seconds (ref 11.4–15.2)

## 2016-05-18 LAB — APTT: APTT: 33 s (ref 24–36)

## 2016-05-18 MED ORDER — CHLORHEXIDINE GLUCONATE 4 % EX LIQD: 60.0000 mL | Freq: Once | CUTANEOUS | Status: DC

## 2016-05-18 NOTE — Progress Notes (Addendum)
Cardiologist denies  Medical Md is Dr.Brian Lehman Prom denies  Stress test denies  Heart cath denies  EKG and CXR in epic from 06-09-16

## 2016-05-18 NOTE — Pre-Procedure Instructions (Signed)
AUDRI NORIS  05/18/2016      [place], [place] [address] Phone: [phone] Fax: [phone]    Your procedure is scheduled on Fri, Dec 15 @ 7:30 AM  Report to [facility] Admitting at 5:30 AM  Call this number if you have problems the morning of surgery:  [phone]   Remember:  Do not eat food or drink liquids after midnight.  Take these medicines the morning of surgery with A SIP OF WATER Synthroid(Levothyroxine)              Stop taking your Methotrexate along with any Vitamins or Herbal Medications. No Goody's,BC's,Aleve,Advil,Motrin,Ibuprofen,or Fish Oil.    Do not wear jewelry, make-up or nail polish.  Do not wear lotions, powders, perfumes, or deoderant.  Do not shave 48 hours prior to surgery.   Do not bring valuables to the hospital.  [facility] is not responsible for any belongings or valuables.  Contacts, dentures or bridgework may not be worn into surgery.  Leave your suitcase in the car.  After surgery it may be brought to your room.  For patients admitted to the hospital, discharge time will be determined by your treatment team.  Patients discharged the day of surgery will not be allowed to drive home.   [facility] - [facility]  Before surgery, you can play an important role.  Because skin is not sterile, your skin needs to be as free of germs as possible.  You can reduce the number of germs on you skin by washing with CHG (chlorahexidine gluconate) soap before surgery.  CHG is an antiseptic cleaner which kills germs and bonds with the skin to continue killing germs even after washing.  Please DO NOT use if you have an allergy to CHG or antibacterial soaps.  If your skin becomes reddened/irritated stop using the CHG and inform your nurse when you arrive at Short Stay.  Do not shave (including legs and underarms) for at least  48 hours prior to the first CHG shower.  You may shave your face.  Please follow these instructions carefully:   1.  Shower with CHG Soap the night before surgery and the                                morning of Surgery.  2.  If you choose to wash your hair, wash your hair first as usual with your       normal shampoo.  3.  After you shampoo, rinse your hair and body thoroughly to remove the                      Shampoo.  4.  Use CHG as you would any other liquid soap.  You can apply chg directly       to the skin and wash gently with scrungie or a clean washcloth.  5.  Apply the CHG Soap to your body ONLY FROM THE NECK DOWN.        Do not use on open wounds or open sores.  Avoid contact with your eyes,       ears, mouth and genitals (private parts).  Wash genitals (private parts)       with your normal soap.  6.  Wash thoroughly, paying special attention to the  area where your surgery        will be performed.  7.  Thoroughly rinse your body with warm water from the neck down.  8.  DO NOT shower/wash with your normal soap after using and rinsing off       the CHG Soap.  9.  Pat yourself dry with a clean towel.            10.  Wear clean pajamas.            11.  Place clean sheets on your bed the night of your first shower and do not        sleep with pets.  Day of Surgery  Do not apply any lotions/deoderants the morning of surgery.  Please wear clean clothes to the hospital/surgery center.    Please read over the following fact sheets that you were given. Pain Booklet, Coughing and Deep Breathing, MRSA Information and Surgical Site Infection Prevention

## 2016-05-28 ENCOUNTER — Inpatient Hospital Stay (HOSPITAL_COMMUNITY): Payer: Medicare Other | Admitting: Anesthesiology

## 2016-05-28 ENCOUNTER — Encounter (HOSPITAL_COMMUNITY): Payer: Self-pay | Admitting: *Deleted

## 2016-05-28 ENCOUNTER — Inpatient Hospital Stay (HOSPITAL_COMMUNITY)
Admission: RE | Admit: 2016-05-28 | Discharge: 2016-05-29 | DRG: 470 | Disposition: A | Discharge status: Home Health | Payer: Medicare Other | Source: Ambulatory Visit | Attending: Orthopedic Surgery | Admitting: Orthopedic Surgery

## 2016-05-28 ENCOUNTER — Encounter (HOSPITAL_COMMUNITY)
Admission: RE | Disposition: A | Discharge status: Home / Self Care | Payer: Self-pay | Source: Ambulatory Visit | Attending: Orthopedic Surgery

## 2016-05-28 DIAGNOSIS — Z801 Family history of malignant neoplasm of trachea, bronchus and lung: Secondary | ICD-10-CM

## 2016-05-28 DIAGNOSIS — B965 Pseudomonas (aeruginosa) (mallei) (pseudomallei) as the cause of diseases classified elsewhere: Secondary | ICD-10-CM | POA: Diagnosis not present

## 2016-05-28 DIAGNOSIS — Z8249 Family history of ischemic heart disease and other diseases of the circulatory system: Secondary | ICD-10-CM

## 2016-05-28 DIAGNOSIS — M810 Age-related osteoporosis without current pathological fracture: Secondary | ICD-10-CM | POA: Diagnosis not present

## 2016-05-28 DIAGNOSIS — M1711 Unilateral primary osteoarthritis, right knee: Principal | ICD-10-CM | POA: Diagnosis present

## 2016-05-28 DIAGNOSIS — I10 Essential (primary) hypertension: Secondary | ICD-10-CM | POA: Diagnosis not present

## 2016-05-28 DIAGNOSIS — R1031 Right lower quadrant pain: Secondary | ICD-10-CM | POA: Diagnosis not present

## 2016-05-28 DIAGNOSIS — N390 Urinary tract infection, site not specified: Secondary | ICD-10-CM | POA: Diagnosis not present

## 2016-05-28 DIAGNOSIS — E039 Hypothyroidism, unspecified: Secondary | ICD-10-CM | POA: Diagnosis not present

## 2016-05-28 DIAGNOSIS — M069 Rheumatoid arthritis, unspecified: Secondary | ICD-10-CM | POA: Diagnosis not present

## 2016-05-28 DIAGNOSIS — M25661 Stiffness of right knee, not elsewhere classified: Secondary | ICD-10-CM

## 2016-05-28 DIAGNOSIS — Z800 Family history of malignant neoplasm of digestive organs: Secondary | ICD-10-CM

## 2016-05-28 DIAGNOSIS — Z96652 Presence of left artificial knee joint: Secondary | ICD-10-CM | POA: Diagnosis present

## 2016-05-28 DIAGNOSIS — Z87891 Personal history of nicotine dependence: Secondary | ICD-10-CM

## 2016-05-28 DIAGNOSIS — Z86010 Personal history of colonic polyps: Secondary | ICD-10-CM

## 2016-05-28 DIAGNOSIS — R262 Difficulty in walking, not elsewhere classified: Secondary | ICD-10-CM

## 2016-05-28 DIAGNOSIS — G8918 Other acute postprocedural pain: Secondary | ICD-10-CM | POA: Diagnosis not present

## 2016-05-28 DIAGNOSIS — Z96651 Presence of right artificial knee joint: Secondary | ICD-10-CM | POA: Diagnosis not present

## 2016-05-28 HISTORY — PX: TOTAL KNEE ARTHROPLASTY: SHX125

## 2016-05-28 SURGERY — ARTHROPLASTY, KNEE, TOTAL
Anesthesia: General | Laterality: Right

## 2016-05-28 MED ORDER — SUGAMMADEX SODIUM 200 MG/2ML IV SOLN
INTRAVENOUS | Status: DC | PRN
  Administered 2016-05-28: 180 mg via INTRAVENOUS

## 2016-05-28 MED ORDER — METHOCARBAMOL 500 MG PO TABS
500.0000 mg | ORAL_TABLET | Freq: Four times a day (QID) | ORAL | Status: DC | PRN
  Administered 2016-05-28: 500 mg via ORAL
  Filled 2016-05-28: qty 1

## 2016-05-28 MED ORDER — ASPIRIN EC 325 MG PO TBEC: 325.0000 mg | DELAYED_RELEASE_TABLET | Freq: Two times a day (BID) | ORAL | 0 refills | Status: DC

## 2016-05-28 MED ORDER — LACTATED RINGERS IV SOLN
INTRAVENOUS | Status: DC | PRN
  Administered 2016-05-28 (×2): via INTRAVENOUS

## 2016-05-28 MED ORDER — HYDROMORPHONE HCL 2 MG/ML IJ SOLN: 0.5000 mg | INTRAMUSCULAR | Status: DC | PRN

## 2016-05-28 MED ORDER — ONDANSETRON HCL 4 MG/2ML IJ SOLN: 4.0000 mg | Freq: Once | INTRAMUSCULAR | Status: DC | PRN

## 2016-05-28 MED ORDER — SODIUM CHLORIDE 0.9 % IV SOLN
INTRAVENOUS | Status: DC
  Administered 2016-05-28 – 2016-05-29 (×2): via INTRAVENOUS

## 2016-05-28 MED ORDER — IRBESARTAN 150 MG PO TABS
150.0000 mg | ORAL_TABLET | Freq: Every day | ORAL | Status: DC
  Administered 2016-05-28 – 2016-05-29 (×2): 150 mg via ORAL
  Filled 2016-05-28 (×2): qty 1

## 2016-05-28 MED ORDER — MIRABEGRON ER 25 MG PO TB24
50.0000 mg | ORAL_TABLET | Freq: Every day | ORAL | Status: DC
  Administered 2016-05-28: 50 mg via ORAL
  Filled 2016-05-28 (×2): qty 2

## 2016-05-28 MED ORDER — SUGAMMADEX SODIUM 200 MG/2ML IV SOLN
INTRAVENOUS | Status: AC
  Filled 2016-05-28: qty 2

## 2016-05-28 MED ORDER — ONDANSETRON HCL 4 MG/2ML IJ SOLN
INTRAMUSCULAR | Status: AC
  Filled 2016-05-28: qty 2

## 2016-05-28 MED ORDER — ROCURONIUM BROMIDE 100 MG/10ML IV SOLN
INTRAVENOUS | Status: DC | PRN
  Administered 2016-05-28: 50 mg via INTRAVENOUS

## 2016-05-28 MED ORDER — MAGNESIUM CITRATE PO SOLN: 1.0000 | Freq: Once | ORAL | Status: DC | PRN

## 2016-05-28 MED ORDER — DEXAMETHASONE SODIUM PHOSPHATE 10 MG/ML IJ SOLN
10.0000 mg | Freq: Two times a day (BID) | INTRAMUSCULAR | Status: AC
  Administered 2016-05-28 – 2016-05-29 (×3): 10 mg via INTRAVENOUS
  Filled 2016-05-28 (×3): qty 1

## 2016-05-28 MED ORDER — OXYCODONE HCL 5 MG PO TABS
ORAL_TABLET | ORAL | Status: AC
  Filled 2016-05-28: qty 1

## 2016-05-28 MED ORDER — METHOCARBAMOL 1000 MG/10ML IJ SOLN
500.0000 mg | Freq: Four times a day (QID) | INTRAVENOUS | Status: DC | PRN
  Filled 2016-05-28: qty 5

## 2016-05-28 MED ORDER — MIDAZOLAM HCL 2 MG/2ML IJ SOLN
INTRAMUSCULAR | Status: AC
  Filled 2016-05-28: qty 2

## 2016-05-28 MED ORDER — PROPOFOL 10 MG/ML IV BOLUS
INTRAVENOUS | Status: DC | PRN
  Administered 2016-05-28: 130 mg via INTRAVENOUS

## 2016-05-28 MED ORDER — SODIUM CHLORIDE 0.9 % IV SOLN
1000.0000 mg | Freq: Once | INTRAVENOUS | Status: AC
  Administered 2016-05-28: 1000 mg via INTRAVENOUS
  Filled 2016-05-28: qty 10

## 2016-05-28 MED ORDER — ONDANSETRON HCL 4 MG PO TABS: 4.0000 mg | ORAL_TABLET | Freq: Four times a day (QID) | ORAL | Status: DC | PRN

## 2016-05-28 MED ORDER — BUPIVACAINE HCL (PF) 0.5 % IJ SOLN
INTRAMUSCULAR | Status: AC
  Filled 2016-05-28: qty 30

## 2016-05-28 MED ORDER — ASPIRIN EC 325 MG PO TBEC
325.0000 mg | DELAYED_RELEASE_TABLET | Freq: Two times a day (BID) | ORAL | Status: DC
  Administered 2016-05-28 – 2016-05-29 (×2): 325 mg via ORAL
  Filled 2016-05-28 (×3): qty 1

## 2016-05-28 MED ORDER — POLYETHYLENE GLYCOL 3350 17 G PO PACK
17.0000 g | PACK | Freq: Every day | ORAL | Status: DC | PRN
Start: 2016-05-28 — End: 2016-05-29

## 2016-05-28 MED ORDER — FENTANYL CITRATE (PF) 100 MCG/2ML IJ SOLN
INTRAMUSCULAR | Status: AC
Start: 2016-05-28 — End: 2016-05-28
  Filled 2016-05-28: qty 2

## 2016-05-28 MED ORDER — FENTANYL CITRATE (PF) 100 MCG/2ML IJ SOLN
INTRAMUSCULAR | Status: AC
  Filled 2016-05-28: qty 4

## 2016-05-28 MED ORDER — BISACODYL 5 MG PO TBEC: 5.0000 mg | DELAYED_RELEASE_TABLET | Freq: Every day | ORAL | Status: DC | PRN

## 2016-05-28 MED ORDER — OXYCODONE HCL 5 MG PO TABS
5.0000 mg | ORAL_TABLET | ORAL | Status: DC | PRN
  Administered 2016-05-28 (×2): 10 mg via ORAL
  Filled 2016-05-28 (×2): qty 2

## 2016-05-28 MED ORDER — LIDOCAINE HCL (CARDIAC) 20 MG/ML IV SOLN
INTRAVENOUS | Status: DC | PRN
  Administered 2016-05-28: 40 mg via INTRAVENOUS

## 2016-05-28 MED ORDER — CEFAZOLIN SODIUM-DEXTROSE 2-4 GM/100ML-% IV SOLN
2.0000 g | Freq: Four times a day (QID) | INTRAVENOUS | Status: AC
  Administered 2016-05-28 (×2): 2 g via INTRAVENOUS
  Filled 2016-05-28 (×2): qty 100

## 2016-05-28 MED ORDER — 0.9 % SODIUM CHLORIDE (POUR BTL) OPTIME
TOPICAL | Status: DC | PRN
Start: 2016-05-28 — End: 2016-05-28
  Administered 2016-05-28: 1000 mL

## 2016-05-28 MED ORDER — DEXAMETHASONE SODIUM PHOSPHATE 10 MG/ML IJ SOLN
INTRAMUSCULAR | Status: AC
  Filled 2016-05-28: qty 1

## 2016-05-28 MED ORDER — MIDAZOLAM HCL 5 MG/5ML IJ SOLN
INTRAMUSCULAR | Status: DC | PRN
  Administered 2016-05-28 (×2): 1 mg via INTRAVENOUS

## 2016-05-28 MED ORDER — PHENYLEPHRINE HCL 10 MG/ML IJ SOLN
INTRAMUSCULAR | Status: DC | PRN
  Administered 2016-05-28 (×2): 40 ug via INTRAVENOUS
  Administered 2016-05-28: 80 ug via INTRAVENOUS

## 2016-05-28 MED ORDER — FENTANYL CITRATE (PF) 100 MCG/2ML IJ SOLN
25.0000 ug | INTRAMUSCULAR | Status: DC | PRN
  Administered 2016-05-28 (×2): 50 ug via INTRAVENOUS

## 2016-05-28 MED ORDER — OXYCODONE-ACETAMINOPHEN 5-325 MG PO TABS: 1.0000 | ORAL_TABLET | Freq: Four times a day (QID) | ORAL | 0 refills | Status: DC | PRN

## 2016-05-28 MED ORDER — ONDANSETRON HCL 4 MG/2ML IJ SOLN: 4.0000 mg | Freq: Four times a day (QID) | INTRAMUSCULAR | Status: DC | PRN

## 2016-05-28 MED ORDER — ARTIFICIAL TEARS OP OINT
TOPICAL_OINTMENT | OPHTHALMIC | Status: AC
  Filled 2016-05-28: qty 3.5

## 2016-05-28 MED ORDER — ZOLPIDEM TARTRATE 5 MG PO TABS: 5.0000 mg | ORAL_TABLET | Freq: Every evening | ORAL | Status: DC | PRN

## 2016-05-28 MED ORDER — SODIUM CHLORIDE 0.9 % IR SOLN
Status: DC | PRN
  Administered 2016-05-28: 3000 mL

## 2016-05-28 MED ORDER — PROPOFOL 10 MG/ML IV BOLUS
INTRAVENOUS | Status: AC
Start: 2016-05-28 — End: 2016-05-28
  Filled 2016-05-28: qty 20

## 2016-05-28 MED ORDER — BUPIVACAINE LIPOSOME 1.3 % IJ SUSP
20.0000 mL | INTRAMUSCULAR | Status: AC
  Administered 2016-05-28: 20 mL
  Filled 2016-05-28: qty 20

## 2016-05-28 MED ORDER — GABAPENTIN 300 MG PO CAPS
300.0000 mg | ORAL_CAPSULE | Freq: Two times a day (BID) | ORAL | Status: DC
  Administered 2016-05-28 – 2016-05-29 (×3): 300 mg via ORAL
  Filled 2016-05-28 (×3): qty 1

## 2016-05-28 MED ORDER — FENTANYL CITRATE (PF) 100 MCG/2ML IJ SOLN
INTRAMUSCULAR | Status: AC
  Filled 2016-05-28: qty 2

## 2016-05-28 MED ORDER — OXYCODONE HCL 5 MG PO TABS
5.0000 mg | ORAL_TABLET | Freq: Once | ORAL | Status: AC | PRN
  Administered 2016-05-28: 5 mg via ORAL

## 2016-05-28 MED ORDER — FENTANYL 25 MCG/HR TD PT72
25.0000 ug | MEDICATED_PATCH | TRANSDERMAL | Status: DC
  Administered 2016-05-28: 25 ug via TRANSDERMAL
  Filled 2016-05-28: qty 1

## 2016-05-28 MED ORDER — DOCUSATE SODIUM 100 MG PO CAPS
100.0000 mg | ORAL_CAPSULE | Freq: Two times a day (BID) | ORAL | Status: DC
  Administered 2016-05-28 – 2016-05-29 (×3): 100 mg via ORAL
  Filled 2016-05-28 (×3): qty 1

## 2016-05-28 MED ORDER — LEVOTHYROXINE SODIUM 50 MCG PO TABS
50.0000 ug | ORAL_TABLET | Freq: Every day | ORAL | Status: DC
  Administered 2016-05-29: 50 ug via ORAL
  Filled 2016-05-28 (×2): qty 1

## 2016-05-28 MED ORDER — DIPHENHYDRAMINE HCL 12.5 MG/5ML PO ELIX: 12.5000 mg | ORAL_SOLUTION | ORAL | Status: DC | PRN

## 2016-05-28 MED ORDER — SODIUM CHLORIDE 0.9 % IJ SOLN
INTRAMUSCULAR | Status: DC | PRN
  Administered 2016-05-28: 20 mL via INTRAVENOUS

## 2016-05-28 MED ORDER — LIDOCAINE 2% (20 MG/ML) 5 ML SYRINGE
INTRAMUSCULAR | Status: AC
  Filled 2016-05-28: qty 5

## 2016-05-28 MED ORDER — DEXAMETHASONE SODIUM PHOSPHATE 10 MG/ML IJ SOLN
INTRAMUSCULAR | Status: DC | PRN
  Administered 2016-05-28: 10 mg via INTRAVENOUS

## 2016-05-28 MED ORDER — TRANEXAMIC ACID 1000 MG/10ML IV SOLN
1000.0000 mg | INTRAVENOUS | Status: AC
  Administered 2016-05-28: 1000 mg via INTRAVENOUS
  Filled 2016-05-28: qty 10

## 2016-05-28 MED ORDER — TIZANIDINE HCL 2 MG PO TABS: 2.0000 mg | ORAL_TABLET | Freq: Three times a day (TID) | ORAL | 0 refills | Status: DC | PRN

## 2016-05-28 MED ORDER — CEFAZOLIN SODIUM-DEXTROSE 2-4 GM/100ML-% IV SOLN
2.0000 g | INTRAVENOUS | Status: AC
  Administered 2016-05-28: 2 g via INTRAVENOUS

## 2016-05-28 MED ORDER — SCOPOLAMINE 1 MG/3DAYS TD PT72
1.0000 | MEDICATED_PATCH | TRANSDERMAL | Status: DC
  Administered 2016-05-28: 1 via TRANSDERMAL
  Administered 2016-05-28: 1.5 mg via TRANSDERMAL
  Filled 2016-05-28 (×2): qty 1

## 2016-05-28 MED ORDER — ALUM & MAG HYDROXIDE-SIMETH 200-200-20 MG/5ML PO SUSP: 30.0000 mL | ORAL | Status: DC | PRN

## 2016-05-28 MED ORDER — FENTANYL CITRATE (PF) 100 MCG/2ML IJ SOLN
INTRAMUSCULAR | Status: DC | PRN
  Administered 2016-05-28: 50 ug via INTRAVENOUS
  Administered 2016-05-28: 100 ug via INTRAVENOUS
  Administered 2016-05-28 (×3): 50 ug via INTRAVENOUS

## 2016-05-28 MED ORDER — ACETAMINOPHEN 650 MG RE SUPP: 650.0000 mg | Freq: Four times a day (QID) | RECTAL | Status: DC | PRN

## 2016-05-28 MED ORDER — OXYCODONE HCL 5 MG/5ML PO SOLN: 5.0000 mg | Freq: Once | ORAL | Status: AC | PRN

## 2016-05-28 MED ORDER — ACETAMINOPHEN 325 MG PO TABS
650.0000 mg | ORAL_TABLET | Freq: Four times a day (QID) | ORAL | Status: DC | PRN
  Administered 2016-05-28: 650 mg via ORAL
  Filled 2016-05-28: qty 2

## 2016-05-28 MED ORDER — BUPIVACAINE HCL (PF) 0.5 % IJ SOLN
INTRAMUSCULAR | Status: DC | PRN
  Administered 2016-05-28: 20 mL

## 2016-05-28 MED ORDER — CELECOXIB 200 MG PO CAPS
200.0000 mg | ORAL_CAPSULE | Freq: Two times a day (BID) | ORAL | Status: DC
  Administered 2016-05-28 – 2016-05-29 (×3): 200 mg via ORAL
  Filled 2016-05-28 (×3): qty 1

## 2016-05-28 SURGICAL SUPPLY — 65 items
BANDAGE ESMARK 6X9 LF (GAUZE/BANDAGES/DRESSINGS) ×1 IMPLANT
BENZOIN TINCTURE PRP APPL 2/3 (GAUZE/BANDAGES/DRESSINGS) ×3 IMPLANT
BLADE SAGITTAL 25.0X1.19X90 (BLADE) ×2 IMPLANT
BLADE SAGITTAL 25.0X1.19X90MM (BLADE) ×1
BLADE SAW SAG 90X13X1.27 (BLADE) ×3 IMPLANT
BNDG ESMARK 6X9 LF (GAUZE/BANDAGES/DRESSINGS) ×3
BOWL SMART MIX CTS (DISPOSABLE) ×3 IMPLANT
CAPT KNEE TOTAL 3 ATTUNE ×3 IMPLANT
CEMENT HV SMART SET (Cement) ×6 IMPLANT
CLOSURE WOUND 1/2 X4 (GAUZE/BANDAGES/DRESSINGS) ×1
COVER SURGICAL LIGHT HANDLE (MISCELLANEOUS) ×3 IMPLANT
CUFF TOURNIQUET SINGLE 34IN LL (TOURNIQUET CUFF) ×3 IMPLANT
CUFF TOURNIQUET SINGLE 44IN (TOURNIQUET CUFF) IMPLANT
DRAPE HALF SHEET 40X57 (DRAPES) ×3 IMPLANT
DRAPE IMP U-DRAPE 54X76 (DRAPES) ×3 IMPLANT
DRAPE U-SHAPE 47X51 STRL (DRAPES) ×3 IMPLANT
DRESSING AQUACEL AG SP 3.5X10 (GAUZE/BANDAGES/DRESSINGS) ×1 IMPLANT
DRSG AQUACEL AG ADV 3.5X10 (GAUZE/BANDAGES/DRESSINGS) IMPLANT
DRSG AQUACEL AG SP 3.5X10 (GAUZE/BANDAGES/DRESSINGS) ×3
DRSG MEPILEX BORDER 4X12 (GAUZE/BANDAGES/DRESSINGS) ×3 IMPLANT
DRSG PAD ABDOMINAL 8X10 ST (GAUZE/BANDAGES/DRESSINGS) ×3 IMPLANT
DURAPREP 26ML APPLICATOR (WOUND CARE) ×3 IMPLANT
ELECT REM PT RETURN 9FT ADLT (ELECTROSURGICAL) ×3
ELECTRODE REM PT RTRN 9FT ADLT (ELECTROSURGICAL) ×1 IMPLANT
EVACUATOR 1/8 PVC DRAIN (DRAIN) ×3 IMPLANT
FACESHIELD WRAPAROUND (MASK) ×3 IMPLANT
GAUZE SPONGE 4X4 12PLY STRL (GAUZE/BANDAGES/DRESSINGS) ×3 IMPLANT
GLOVE BIOGEL PI IND STRL 8 (GLOVE) ×2 IMPLANT
GLOVE BIOGEL PI INDICATOR 8 (GLOVE) ×4
GLOVE ECLIPSE 7.5 STRL STRAW (GLOVE) ×6 IMPLANT
GOWN STRL REUS W/ TWL LRG LVL3 (GOWN DISPOSABLE) ×1 IMPLANT
GOWN STRL REUS W/ TWL XL LVL3 (GOWN DISPOSABLE) ×2 IMPLANT
GOWN STRL REUS W/TWL LRG LVL3 (GOWN DISPOSABLE) ×2
GOWN STRL REUS W/TWL XL LVL3 (GOWN DISPOSABLE) ×4
HANDPIECE INTERPULSE COAX TIP (DISPOSABLE) ×2
HOOD PEEL AWAY FACE SHEILD DIS (HOOD) ×9 IMPLANT
IMMOBILIZER KNEE 20 (SOFTGOODS) IMPLANT
IMMOBILIZER KNEE 22 UNIV (SOFTGOODS) ×3 IMPLANT
KIT BASIN OR (CUSTOM PROCEDURE TRAY) ×3 IMPLANT
KIT ROOM TURNOVER OR (KITS) ×3 IMPLANT
MANIFOLD NEPTUNE II (INSTRUMENTS) ×3 IMPLANT
NEEDLE 22X1 1/2 (OR ONLY) (NEEDLE) ×3 IMPLANT
NEEDLE SPNL 22GX3.5 QUINCKE BK (NEEDLE) ×3 IMPLANT
NS IRRIG 1000ML POUR BTL (IV SOLUTION) ×3 IMPLANT
PACK TOTAL JOINT (CUSTOM PROCEDURE TRAY) ×3 IMPLANT
PACK UNIVERSAL I (CUSTOM PROCEDURE TRAY) ×3 IMPLANT
PAD ARMBOARD 7.5X6 YLW CONV (MISCELLANEOUS) ×6 IMPLANT
PAD CAST 4YDX4 CTTN HI CHSV (CAST SUPPLIES) ×1 IMPLANT
PADDING CAST COTTON 4X4 STRL (CAST SUPPLIES) ×2
SET HNDPC FAN SPRY TIP SCT (DISPOSABLE) ×1 IMPLANT
STAPLER VISISTAT 35W (STAPLE) IMPLANT
STRIP CLOSURE SKIN 1/2X4 (GAUZE/BANDAGES/DRESSINGS) ×2 IMPLANT
SUCTION FRAZIER HANDLE 10FR (MISCELLANEOUS) ×2
SUCTION TUBE FRAZIER 10FR DISP (MISCELLANEOUS) ×1 IMPLANT
SUT MNCRL AB 3-0 PS2 18 (SUTURE) IMPLANT
SUT VIC AB 0 CTB1 27 (SUTURE) ×6 IMPLANT
SUT VIC AB 1 CT1 27 (SUTURE) ×4
SUT VIC AB 1 CT1 27XBRD ANBCTR (SUTURE) ×2 IMPLANT
SUT VIC AB 2-0 CTB1 (SUTURE) ×6 IMPLANT
SYR 50ML LL SCALE MARK (SYRINGE) ×3 IMPLANT
TOWEL OR 17X24 6PK STRL BLUE (TOWEL DISPOSABLE) ×3 IMPLANT
TOWEL OR 17X26 10 PK STRL BLUE (TOWEL DISPOSABLE) ×3 IMPLANT
TRAY CATH 16FR W/PLASTIC CATH (SET/KITS/TRAYS/PACK) IMPLANT
TRAY FOLEY CATH 16FRSI W/METER (SET/KITS/TRAYS/PACK) IMPLANT
WRAP KNEE MAXI GEL POST OP (GAUZE/BANDAGES/DRESSINGS) ×3 IMPLANT

## 2016-05-28 NOTE — Anesthesia Procedure Notes (Signed)
Anesthesia Regional Block:  Adductor canal block  Pre-Anesthetic Checklist: ,, timeout performed, Correct Patient, Correct Site, Correct Laterality, Correct Procedure, Correct Position, site marked, Risks and benefits discussed,  Surgical consent,  Pre-op evaluation,  At surgeon's request and post-op pain management  Laterality: Right  Prep: chloraprep       Needles:  Injection technique: Single-shot  Needle Type: Echogenic Stimulator Needle          Additional Needles:  Procedures: ultrasound guided (picture in chart) Adductor canal block Narrative:  Start time: 05/28/2016 7:20 AM End time: 05/28/2016 7:25 AM Injection made incrementally with aspirations every 5 mL.  Performed by: Personally   Additional Notes: 20 cc Naropin injected easily.

## 2016-05-28 NOTE — Transfer of Care (Signed)
Immediate Anesthesia Transfer of Care Note  Patient: Jennifer Goodman  Procedure(s) Performed: Procedure(s): TOTAL KNEE ARTHROPLASTY (Right)  Patient Location: PACU  Anesthesia Type:General  Level of Consciousness: awake, alert , oriented and patient cooperative  Airway & Oxygen Therapy: Patient Spontanous Breathing  Post-op Assessment: Report given to RN, Post -op Vital signs reviewed and stable and Patient moving all extremities  Post vital signs: Reviewed and stable  Last Vitals:  Vitals:   05/28/16 0653 05/28/16 0920  BP: (!) 173/80 (!) 157/80  Pulse:  74  Resp:  12  Temp:      Last Pain:  Vitals:   05/28/16 0606  TempSrc: Oral      Patients Stated Pain Goal: 5 ([id number])  Complications: No apparent anesthesia complications

## 2016-05-28 NOTE — Care Management Note (Signed)
Case Management Note  Patient Details  Name: Jennifer Goodman MRN: [id number] Date of Birth: June 27, 1934  Subjective/Objective:  81 yr old female s/p right total knee arthroplasty.                  Action/Plan:  Patient was preoperatively setup with [place]. Therapy has to see patient.   Expected Discharge Date:                  Expected Discharge Plan:     In-House Referral:     Discharge planning Services     Post Acute Care Choice:    Choice offered to:     DME Arranged:    DME Agency:     HH Arranged:    HH Agency:   [facility]  Status of Service:     If discussed at [place] of Stay Meetings, dates discussed:    Additional Comments:  Ninfa Meeker, RN 05/28/2016, 4:03 PM

## 2016-05-28 NOTE — Progress Notes (Signed)
Pt admitted to the unit from pacu; pt A&O x4; IV intact and transfusing; CPM On; VSS; right knee incision dsg remains clean dry and intact with no stain or active bleeding. Pt TED's on; neuro check wnl; pt oriented to the unit and room; fall/safety precaution and prevention education completed with pt. Pt voided x1 to BR with assistance; pt call light within reach and bed alarm on. Will continue to closely monitor. PDarden Palmer Yaviel Kloster Rn

## 2016-05-28 NOTE — Progress Notes (Signed)
Pt c/o pain during urination and believe she has UTI; PA [place] called and notified and new order received to UA urinalysis and culture; In &Out cath UA per PA. Pt notified and UA sent. Delia Heady RN

## 2016-05-28 NOTE — Progress Notes (Signed)
Orthopedic Tech Progress Note Patient Details:  Jennifer Goodman [birth year] [id number]  CPM Right Knee CPM Right Knee: On Right Knee Flexion (Degrees): 70 Right Knee Extension (Degrees): 0 Additional Comments: trapeze bar and foot roll   [name] 05/28/2016, 9:59 AM

## 2016-05-28 NOTE — Anesthesia Preprocedure Evaluation (Signed)
Anesthesia Evaluation  Patient identified by MRN, date of birth, ID band Patient awake    Reviewed: Allergy & Precautions, NPO status , Patient's Chart, lab work & pertinent test results  Airway Mallampati: II  TM Distance: >3 FB Neck ROM: Full    Dental  (+) Teeth Intact, Dental Advisory Given   Pulmonary former smoker,    breath sounds clear to auscultation       Cardiovascular hypertension,  Rhythm:Regular Rate:Normal     Neuro/Psych    GI/Hepatic   Endo/Other    Renal/GU      Musculoskeletal   Abdominal   Peds  Hematology   Anesthesia Other Findings   Reproductive/Obstetrics                             Anesthesia Physical Anesthesia Plan  ASA: III  Anesthesia Plan: General and Regional   Post-op Pain Management:    Induction: Intravenous  Airway Management Planned: Oral ETT  Additional Equipment:   Intra-op Plan:   Post-operative Plan: Extubation in OR  Informed Consent: I have reviewed the patients History and Physical, chart, labs and discussed the procedure including the risks, benefits and alternatives for the proposed anesthesia with the patient or authorized representative who has indicated his/her understanding and acceptance.   Dental advisory given  Plan Discussed with: CRNA and Anesthesiologist  Anesthesia Plan Comments:         Anesthesia Quick Evaluation

## 2016-05-28 NOTE — Progress Notes (Signed)
Orthopedic Tech Progress Note Patient Details:  Jennifer Goodman July 26, 1934 [id number] Put on cpm at [place] Patient ID: Jennifer Goodman, female   DOB: Jan 26, 1935, 81 y.o.   MRN: [id number]   Braulio Bosch 05/28/2016, 7:09 PM

## 2016-05-28 NOTE — Discharge Instructions (Signed)

## 2016-05-28 NOTE — Progress Notes (Signed)
Physical Therapy Treatment Patient Details Name: Jennifer Goodman MRN: [id number] DOB: 24-Oct-1934 Today's Date: 05/28/2016    History of Present Illness Pt is a 81 yo female admitted on 05/28/16 for right TKA. PMH significant for L TKA 06/20/15, OP, hypothyroid.     PT Comments    Patient is progress well toward mobility goals and tolerated gait and stair training this session. Continue to progress as tolerated with anticipated d/c home with HHPT.   Follow Up Recommendations  Home health PT     Equipment Recommendations  None recommended by PT    Recommendations for Other Services       Precautions / Restrictions Precautions Precautions: Knee Precaution Booklet Issued: Yes (comment) Precaution Comments: Sheet given and reviewed. No pillow under knee Required Braces or Orthoses: Knee Immobilizer - Right Knee Immobilizer - Right: On when out of bed or walking Restrictions Weight Bearing Restrictions: Yes RLE Weight Bearing: Weight bearing as tolerated    Mobility  Bed Mobility Overal bed mobility: Needs Assistance Bed Mobility: Supine to Sit     Supine to sit: Supervision     General bed mobility comments: supervision for safety  Transfers Overall transfer level: Needs assistance Equipment used: Rolling walker (2 wheeled) Transfers: Sit to/from Stand Sit to Stand: Min guard         General transfer comment: cues for hand placement  Ambulation/Gait Ambulation/Gait assistance: Min guard Ambulation Distance (Feet): 150 Feet Assistive device: Rolling walker (2 wheeled) Gait Pattern/deviations: Step-through pattern;Decreased stance time - right;Decreased weight shift to right Gait velocity: decreased Gait velocity interpretation: Below normal speed for age/gender General Gait Details: cues for posture and proximity of RW; slow, steady gait; mildly antalgic   Stairs Stairs: Yes   Stair Management: Two rails;Forwards Number of Stairs: 2 General stair  comments: pt educated on sequencing and technqiue; min guard for safety; R knee stable with WB  Wheelchair Mobility    Modified Rankin (Stroke Patients Only)       Balance Overall balance assessment: Needs assistance Sitting-balance support: No upper extremity supported;Feet supported Sitting balance-Leahy Scale: Good Sitting balance - Comments: sitting edge of recliner   Standing balance support: Bilateral upper extremity supported Standing balance-Leahy Scale: Poor Standing balance comment: relies on RW for standing balance                    Cognition Arousal/Alertness: Awake/alert Behavior During Therapy: WFL for tasks assessed/performed Overall Cognitive Status: Within Functional Limits for tasks assessed                      Exercises Total Joint Exercises Ankle Circles/Pumps: AROM;Both;20 reps;Supine Quad Sets: AROM;Right;10 reps;Supine Towel Squeeze: PROM;Left;10 reps;Supine Heel Slides: AROM;Right;10 reps Goniometric ROM: ~90 degrees flexion     General Comments        Pertinent Vitals/Pain Pain Assessment: Faces Faces Pain Scale: Hurts a little bit Pain Location: right knee Pain Descriptors / Indicators: Sore Pain Intervention(s): Limited activity within patient's tolerance;Monitored during session;Premedicated before session;Repositioned    Home Living                      Prior Function            PT Goals (current goals can now be found in the care plan section) Acute Rehab PT Goals Patient Stated Goal: to get home tomorrow PT Goal Formulation: With patient Time For Goal Achievement: 06/04/16 Potential to Achieve Goals: Good Progress towards PT goals:  Progressing toward goals    Frequency    7X/week      PT Plan Current plan remains appropriate    Co-evaluation             End of Session Equipment Utilized During Treatment: Gait belt Activity Tolerance: Patient tolerated treatment well Patient left: in  chair;with call bell/phone within reach     Time: 1443-1515 PT Time Calculation (min) (ACUTE ONLY): 32 min  Charges:  $Gait Training: 8-22 mins  $Therapeutic Activity: 8-22 mins                    G Codes:      Salina April, PTA Pager: [phone]   05/28/2016, 4:05 PM

## 2016-05-28 NOTE — Brief Op Note (Signed)
05/28/2016  12:20 PM  PATIENT:  Jennifer Goodman  81 y.o. female  PRE-OPERATIVE DIAGNOSIS:  OSTEOARTHRITIS RIGHT KNEE  POST-OPERATIVE DIAGNOSIS:  OSTEOARTHRITIS RIGHT KNEE  PROCEDURE:  Procedure(s): TOTAL KNEE ARTHROPLASTY (Right)  SURGEON:  Surgeon(s) and Role:    * Dorna Leitz, MD - Primary  PHYSICIAN ASSISTANT:   ASSISTANTS: bethuine   ANESTHESIA:   general  EBL:  Total I/O In: [id number] [P.O.:240; I.V.:1250] Out: 75 [Blood:75]  BLOOD ADMINISTERED:none  DRAINS: none   LOCAL MEDICATIONS USED:  MARCAINE    and OTHER experel  SPECIMEN:  No Specimen  DISPOSITION OF SPECIMEN:  N/A  COUNTS:  YES  TOURNIQUET:   Total Tourniquet Time Documented: Thigh (Right) - 48 minutes Total: Thigh (Right) - 48 minutes   DICTATION: .Other Dictation: Dictation Number [id number]  PLAN OF CARE: Admit to inpatient   PATIENT DISPOSITION:  PACU - hemodynamically stable.   Delay start of Pharmacological VTE agent (>24hrs) due to surgical blood loss or risk of bleeding: no

## 2016-05-28 NOTE — H&P (Signed)
TOTAL KNEE ADMISSION H&P  Patient is being admitted for right total knee arthroplasty.  Subjective:  Chief Complaint:right knee pain.  HPI: Jennifer Goodman, 81 y.o. female, has a history of pain and functional disability in the right knee due to arthritis and has failed non-surgical conservative treatments for greater than 12 weeks to includeNSAID's and/or analgesics, corticosteriod injections, viscosupplementation injections, weight reduction as appropriate and activity modification.  Onset of symptoms was gradual, starting 5 years ago with gradually worsening course since that time. The patient noted prior procedures on the knee to include  arthroscopy and menisectomy on the right knee(s).  Patient currently rates pain in the right knee(s) at 9 out of 10 with activity. Patient has night pain, worsening of pain with activity and weight bearing, pain that interferes with activities of daily living, pain with passive range of motion, crepitus and joint swelling.  Patient has evidence of subchondral sclerosis, periarticular osteophytes and joint space narrowing by imaging studies. This patient has had failure of all reasonable conservative care. There is no active infection.  Patient Active Problem List   Diagnosis Date Noted  . Primary osteoarthritis of left knee 06/20/2015  . Osteoporosis 06/20/2015  . Closed displaced fracture of medial condyle of left femur ([place]) 06/20/2015  . Abdominal pain, RLQ (right lower quadrant) 05/19/2012   Past Medical History:  Diagnosis Date  . Anemia   . Arthritis    Rhuematoid-takes Enbrel and Methorexate   . Back pain    states in Jan 2017 spinal was attempted 4 times and has had back pain since.  . Diverticulosis   . History of colon polyps    benign  . Hypertension    takes Avapro daily  . Hypothyroidism    takes Synthroid daily  . Joint pain   . Pneumonia    at age 40  . PONV (postoperative nausea and vomiting)   . Urinary frequency   . Urinary  urgency    was taking Myrbetriq but stopped.Plans to start back    Past Surgical History:  Procedure Laterality Date  . CHOLECYSTECTOMY    . COLONOSCOPY    . EYE SURGERY    . HAND TENDON SURGERY     Dr. Amedeo Plenty  . KNEE ARTHROSCOPY    . TOTAL KNEE ARTHROPLASTY Left 06/20/2015  . TOTAL KNEE ARTHROPLASTY Left 06/20/2015   Procedure: TOTAL KNEE ARTHROPLASTY;  Surgeon: Dorna Leitz, MD;  Location: [facility];  Service: Orthopedics;  Laterality: Left;    Prescriptions Prior to Admission  Medication Sig Dispense Refill Last Dose  . Cholecalciferol (VITAMIN D3) 5000 UNITS TABS Take 1 tablet by mouth daily.   Past Month at Unknown time  . etanercept (ENBREL) 50 MG/ML injection Inject 50 mg into the skin once a week. Thursdays   Past Month at Unknown time  . fentaNYL (DURAGESIC - DOSED MCG/HR) 25 MCG/HR Place 1 patch onto the skin every 3 (three) days.   Past Week at Unknown time  . folic acid (FOLVITE) 1 MG tablet Take 1 mg by mouth daily.   05/27/2016 at Unknown time  . gabapentin (NEURONTIN) 100 MG capsule Take 300 mg by mouth at bedtime as needed (pain).    05/27/2016 at Unknown time  . irbesartan (AVAPRO) 150 MG tablet Take 150 mg by mouth daily.    05/27/2016 at Unknown time  . levothyroxine (SYNTHROID, LEVOTHROID) 50 MCG tablet Take 50 mcg by mouth daily before breakfast.   05/28/2016 at 0415  . methotrexate (RHEUMATREX) 2.5 MG  tablet Take 20 mg by mouth once a week.   05/17/2016  . mirabegron ER (MYRBETRIQ) 50 MG TB24 tablet Take 50 mg by mouth daily.   Past Month at Unknown time  . Probiotic Product (ULTRAFLORA IMMUNE HEALTH PO) Take 1 capsule by mouth daily.    05/27/2016 at Unknown time  . cyanocobalamin (,VITAMIN B-12,) 1000 MCG/ML injection Inject 1,000 mcg into the muscle every 30 (thirty) days.   More than a month at Unknown time   Allergies  Allergen Reactions  . No Known Allergies     Social History  Substance Use Topics  . Smoking status: Former Smoker    Quit date: 06/14/1978  .  Smokeless tobacco: Never Used     Comment: quit 32 yrs ago  . Alcohol use Yes     Comment: occ    Family History  Problem Relation Age of Onset  . Cancer Father     colon ca/ lung ca  . Heart disease Father      ROS ROS: I have reviewed the patient's review of systems thoroughly and there are no positive responses as relates to the HPI. Objective:  Physical Exam  Vital signs in last 24 hours: Temp:  [98 F (36.7 C)] 98 F (36.7 C) (12/15 0606) Pulse Rate:  [71] 71 (12/15 0606) Resp:  [18] 18 (12/15 0606) BP: (173-194)/(80-81) 173/80 (12/15 0653) SpO2:  [100 %] 100 % (12/15 0606) Weight:  [66.7 kg (147 lb)] 66.7 kg (147 lb) (12/15 0606) Well-developed well-nourished patient in no acute distress. Alert and oriented x3 HEENT:within normal limits Cardiac: Regular rate and rhythm Pulmonary: Lungs clear to auscultation Abdomen: Soft and nontender.  Normal active bowel sounds  Musculoskeletal: (R KNEE: +ttp over med side rom 5-105 mild effusion no instability Labs: Recent Results (from the past 2160 hour(s))  APTT     Status: None   Collection Time: 05/18/16 11:40 AM  Result Value Ref Range   aPTT 33 24 - 36 seconds  CBC WITH DIFFERENTIAL     Status: Abnormal   Collection Time: 05/18/16 11:40 AM  Result Value Ref Range   WBC 4.7 4.0 - 10.5 K/uL   RBC 3.89 3.87 - 5.11 MIL/uL   Hemoglobin 12.9 12.0 - 15.0 g/dL   HCT 39.3 36.0 - 46.0 %   MCV 101.0 (H) 78.0 - 100.0 fL   MCH 33.2 26.0 - 34.0 pg   MCHC 32.8 30.0 - 36.0 g/dL   RDW 14.7 11.5 - 15.5 %   Platelets 203 150 - 400 K/uL   Neutrophils Relative % 62 %   Neutro Abs 2.9 1.7 - 7.7 K/uL   Lymphocytes Relative 25 %   Lymphs Abs 1.2 0.7 - 4.0 K/uL   Monocytes Relative 10 %   Monocytes Absolute 0.5 0.1 - 1.0 K/uL   Eosinophils Relative 3 %   Eosinophils Absolute 0.1 0.0 - 0.7 K/uL   Basophils Relative 0 %   Basophils Absolute 0.0 0.0 - 0.1 K/uL  Comprehensive metabolic panel     Status: Abnormal   Collection Time:  05/18/16 11:40 AM  Result Value Ref Range   Sodium 138 135 - 145 mmol/L   Potassium 4.5 3.5 - 5.1 mmol/L   Chloride 106 101 - 111 mmol/L   CO2 26 22 - 32 mmol/L   Glucose, Bld 104 (H) 65 - 99 mg/dL   BUN 11 6 - 20 mg/dL   Creatinine, Ser 1.14 (H) 0.44 - 1.00 mg/dL   Calcium 9.7 8.9 -  10.3 mg/dL   Total Protein 6.4 (L) 6.5 - 8.1 g/dL   Albumin 4.0 3.5 - 5.0 g/dL   AST 32 15 - 41 U/L   ALT 19 14 - 54 U/L   Alkaline Phosphatase 60 38 - 126 U/L   Total Bilirubin 1.3 (H) 0.3 - 1.2 mg/dL   GFR calc non Af Amer 45 (L) >60 mL/min   GFR calc Af Amer 52 (L) >60 mL/min    Comment: (NOTE) The eGFR has been calculated using the CKD EPI equation. This calculation has not been validated in all clinical situations. eGFR's persistently <60 mL/min signify possible Chronic Kidney Disease.    Anion gap 6 5 - 15  Protime-INR     Status: None   Collection Time: 05/18/16 11:40 AM  Result Value Ref Range   Prothrombin Time 13.1 11.4 - 15.2 seconds   INR 0.99   Urinalysis, Routine w reflex microscopic (not at [facility])     Status: Abnormal   Collection Time: 05/18/16 11:40 AM  Result Value Ref Range   Color, Urine YELLOW YELLOW   APPearance HAZY (A) CLEAR   Specific Gravity, Urine 1.012 1.005 - 1.030   pH 5.0 5.0 - 8.0   Glucose, UA NEGATIVE NEGATIVE mg/dL   Hgb urine dipstick SMALL (A) NEGATIVE   Bilirubin Urine NEGATIVE NEGATIVE   Ketones, ur NEGATIVE NEGATIVE mg/dL   Protein, ur NEGATIVE NEGATIVE mg/dL   Nitrite NEGATIVE NEGATIVE   Leukocytes, UA LARGE (A) NEGATIVE   RBC / HPF 6-30 0 - 5 RBC/hpf   WBC, UA TOO NUMEROUS TO COUNT 0 - 5 WBC/hpf   Bacteria, UA RARE (A) NONE SEEN   Squamous Epithelial / LPF 0-5 (A) NONE SEEN   WBC Clumps PRESENT    Mucous PRESENT    Hyaline Casts, UA PRESENT   Surgical pcr screen     Status: None   Collection Time: 05/18/16 11:41 AM  Result Value Ref Range   MRSA, PCR NEGATIVE NEGATIVE   Staphylococcus aureus NEGATIVE NEGATIVE    Comment:        The Xpert SA  Assay (FDA approved for NASAL specimens in patients over 18 years of age), is one component of a comprehensive surveillance program.  Test performance has been validated by [facility] for patients greater than or equal to 30 year old. It is not intended to diagnose infection nor to guide or monitor treatment.   Type and screen Order type and screen if day of surgery is less than 15 days from draw of preadmission visit or order morning of surgery if day of surgery is greater than 6 days from preadmission visit.     Status: None   Collection Time: 05/18/16 11:42 AM  Result Value Ref Range   ABO/RH(D) A POS    Antibody Screen NEG    Sample Expiration 06/01/2016    Extend sample reason NO TRANSFUSIONS OR PREGNANCY IN THE PAST 3 MONTHS     Estimated body mass index is 26.04 kg/m as calculated from the following:   Height as of 05/18/16: 5' 3"  (1.6 m).   Weight as of this encounter: 66.7 kg (147 lb).   Imaging Review Plain radiographs demonstrate severe degenerative joint disease of the right knee(s). The overall alignment ismild varus. The bone quality appears to be fair for age and reported activity level.  Assessment/Plan:  End stage arthritis, right knee   The patient history, physical examination, clinical judgment of the provider and imaging studies are consistent  with end stage degenerative joint disease of the right knee(s) and total knee arthroplasty is deemed medically necessary. The treatment options including medical management, injection therapy arthroscopy and arthroplasty were discussed at length. The risks and benefits of total knee arthroplasty were presented and reviewed. The risks due to aseptic loosening, infection, stiffness, patella tracking problems, thromboembolic complications and other imponderables were discussed. The patient acknowledged the explanation, agreed to proceed with the plan and consent was signed. Patient is being admitted for inpatient treatment  for surgery, pain control, PT, OT, prophylactic antibiotics, VTE prophylaxis, progressive ambulation and ADL's and discharge planning. The patient is planning to be discharged home with home health services

## 2016-05-28 NOTE — Anesthesia Postprocedure Evaluation (Signed)
Anesthesia Post Note  Patient: Jennifer Goodman  Procedure(s) Performed: Procedure(s) (LRB): TOTAL KNEE ARTHROPLASTY (Right)  Patient location during evaluation: PACU Anesthesia Type: General and Regional Level of consciousness: awake, awake and alert and oriented Pain management: pain level controlled Vital Signs Assessment: post-procedure vital signs reviewed and stable Respiratory status: spontaneous breathing, nonlabored ventilation and respiratory function stable Cardiovascular status: blood pressure returned to baseline Anesthetic complications: no    Last Vitals:  Vitals:   05/28/16 1020 05/28/16 1047  BP: (!) 170/79 (!) 171/96  Pulse: (!) 58 63  Resp: 11 16  Temp: 36.4 C 36.3 C    Last Pain:  Vitals:   05/28/16 1317  TempSrc:   PainSc: Asleep                 Luiscarlos Kaczmarczyk COKER

## 2016-05-28 NOTE — Evaluation (Signed)
Physical Therapy Evaluation Patient Details Name: Jennifer Goodman MRN: [id number] DOB: 09/25/1934 Today's Date: 05/28/2016   History of Present Illness  Pt is a 81 yo female admitted on 05/28/16 for right TKA. PMH significant for L TKA 06/20/15, OP, hypothyroid.   Clinical Impression  Pt is POD 0 and moving well with therapy. Prior to admission, pt was completely independent with all ADLs and IADLs including driving and playing golf. Pt had her left knee replaced in January of this year and recovered well despite low back pain from multiple attempts to perform spinal block. Pt is in bathroom with RN when pt arrives and is able to perform sit to stand transfer and short distance gait with Min A for safety. Pt will benefit from being seen acutely in order to address the below deficits to assist with return to PLOF. Pt will benefit from HHPT following Dc.     Follow Up Recommendations Home health PT    Equipment Recommendations  None recommended by PT    Recommendations for Other Services       Precautions / Restrictions Precautions Precautions: Knee Precaution Booklet Issued: Yes (comment) Precaution Comments: Sheet given and reviewed. No pillow under knee Required Braces or Orthoses: Knee Immobilizer - Right Knee Immobilizer - Right: On when out of bed or walking Restrictions Weight Bearing Restrictions: Yes RLE Weight Bearing: Weight bearing as tolerated      Mobility  Bed Mobility               General bed mobility comments: Pt recieved in bathroom  Transfers Overall transfer level: Needs assistance Equipment used: Rolling walker (2 wheeled) Transfers: Sit to/from Stand Sit to Stand: Min guard         General transfer comment: Min guard for safety from commode to RW  Ambulation/Gait Ambulation/Gait assistance: Min assist Ambulation Distance (Feet): 20 Feet Assistive device: Rolling walker (2 wheeled) Gait Pattern/deviations: Decreased weight shift to  right;Decreased step length - left;Decreased stance time - right;Antalgic Gait velocity: decreased Gait velocity interpretation: Below normal speed for age/gender General Gait Details: Mild antalgic gait with decreased weightbearing through RLE. Min A for safety to maneuver IV pole  Stairs            Wheelchair Mobility    Modified Rankin (Stroke Patients Only)       Balance Overall balance assessment: Needs assistance Sitting-balance support: No upper extremity supported;Feet supported Sitting balance-Leahy Scale: Good Sitting balance - Comments: sitting edge of recliner   Standing balance support: Bilateral upper extremity supported Standing balance-Leahy Scale: Poor Standing balance comment: relies on RW for standing balance                             Pertinent Vitals/Pain Pain Assessment: 0-10 Pain Score: 5  Pain Location: right knee Pain Descriptors / Indicators: Constant Pain Intervention(s): Monitored during session;Premedicated before session;Ice applied    Home Living Family/patient expects to be discharged to:: Private residence Living Arrangements: Spouse/significant other Available Help at Discharge: Family;Available 24 hours/day Type of Home: House Home Access: Stairs to enter Entrance Stairs-Rails: Right;Left Entrance Stairs-Number of Steps: flight Home Layout: Two level;1/2 bath on main level;Able to live on main level with bedroom/bathroom Home Equipment: Gilford Rile - 2 wheels;Cane - single point      Prior Function Level of Independence: Independent         Comments: completely independent including driving and playing golf     Hand  Dominance   Dominant Hand: Right    Extremity/Trunk Assessment   Upper Extremity Assessment Upper Extremity Assessment: Overall WFL for tasks assessed    Lower Extremity Assessment Lower Extremity Assessment: RLE deficits/detail RLE Deficits / Details: pt with normal post op pain and weakness.  At least 3/5 ankle and 2/5 knee and hip per gross functional assessment    Cervical / Trunk Assessment Cervical / Trunk Assessment: Normal  Communication   Communication: No difficulties  Cognition Arousal/Alertness: Awake/alert Behavior During Therapy: WFL for tasks assessed/performed Overall Cognitive Status: Within Functional Limits for tasks assessed                      General Comments      Exercises Total Joint Exercises Ankle Circles/Pumps: AROM;Both;20 reps;Supine Quad Sets: AROM;Right;10 reps;Supine Towel Squeeze: PROM;Left;10 reps;Supine   Assessment/Plan    PT Assessment Patient needs continued PT services  PT Problem List Decreased strength;Decreased range of motion;Decreased activity tolerance;Decreased balance;Decreased mobility;Decreased knowledge of use of DME;Pain          PT Treatment Interventions DME instruction;Gait training;Stair training;Functional mobility training;Balance training;Therapeutic exercise;Therapeutic activities;Patient/family education    PT Goals (Current goals can be found in the Care Plan section)  Acute Rehab PT Goals Patient Stated Goal: to get home tomorrow PT Goal Formulation: With patient Time For Goal Achievement: 06/04/16 Potential to Achieve Goals: Good    Frequency 7X/week   Barriers to discharge        Co-evaluation               End of Session Equipment Utilized During Treatment: Gait belt;Right knee immobilizer Activity Tolerance: Patient tolerated treatment well Patient left: in chair;with call bell/phone within reach Nurse Communication: Mobility status         Time: 1157-1228 PT Time Calculation (min) (ACUTE ONLY): 31 min   Charges:   PT Evaluation $PT Eval Low Complexity: 1 Procedure PT Treatments $Therapeutic Exercise: 8-22 mins   PT G Codes:        Jennifer Goodman Sloan Leiter 05/28/2016, 1:03 PM

## 2016-05-28 NOTE — Anesthesia Procedure Notes (Signed)
Procedure Name: Intubation Date/Time: 05/28/2016 7:42 AM Performed by: Rebekah Chesterfield L Pre-anesthesia Checklist: Patient identified, Emergency Drugs available, Suction available and Patient being monitored Patient Re-evaluated:Patient Re-evaluated prior to inductionOxygen Delivery Method: Circle System Utilized Preoxygenation: Pre-oxygenation with 100% oxygen Intubation Type: IV induction Ventilation: Mask ventilation without difficulty Laryngoscope Size: Mac and 3 Grade View: Grade I Tube type: Oral Tube size: 7.5 mm Number of attempts: 1 Airway Equipment and Method: Stylet Placement Confirmation: ETT inserted through vocal cords under direct vision,  positive ETCO2 and breath sounds checked- equal and bilateral Secured at: 20 cm Tube secured with: Tape Dental Injury: Teeth and Oropharynx as per pre-operative assessment

## 2016-05-29 DIAGNOSIS — N390 Urinary tract infection, site not specified: Secondary | ICD-10-CM

## 2016-05-29 LAB — URINALYSIS, ROUTINE W REFLEX MICROSCOPIC
BILIRUBIN URINE: NEGATIVE
Glucose, UA: 50 mg/dL — AB
KETONES UR: 5 mg/dL — AB
Nitrite: NEGATIVE
Protein, ur: 30 mg/dL — AB
SPECIFIC GRAVITY, URINE: 1.023 (ref 1.005–1.030)
pH: 5 (ref 5.0–8.0)

## 2016-05-29 LAB — BASIC METABOLIC PANEL
Anion gap: 8 (ref 5–15)
BUN: 11 mg/dL (ref 6–20)
CALCIUM: 8.3 mg/dL — AB (ref 8.9–10.3)
CO2: 22 mmol/L (ref 22–32)
CREATININE: 1.11 mg/dL — AB (ref 0.44–1.00)
Chloride: 105 mmol/L (ref 101–111)
GFR calc Af Amer: 53 mL/min — ABNORMAL LOW (ref >60)
GFR, EST NON AFRICAN AMERICAN: 46 mL/min — AB (ref >60)
GLUCOSE: 135 mg/dL — AB (ref 65–99)
Potassium: 4.7 mmol/L (ref 3.5–5.1)
Sodium: 135 mmol/L (ref 135–145)

## 2016-05-29 LAB — CBC
HCT: 32.5 % — ABNORMAL LOW (ref 36.0–46.0)
Hemoglobin: 10.8 g/dL — ABNORMAL LOW (ref 12.0–15.0)
MCH: 33.4 pg (ref 26.0–34.0)
MCHC: 33.2 g/dL (ref 30.0–36.0)
MCV: 100.6 fL — AB (ref 78.0–100.0)
PLATELETS: 201 10*3/uL (ref 150–400)
RBC: 3.23 MIL/uL — ABNORMAL LOW (ref 3.87–5.11)
RDW: 14.1 % (ref 11.5–15.5)
WBC: 9.7 10*3/uL (ref 4.0–10.5)

## 2016-05-29 MED ORDER — HYDROCODONE-ACETAMINOPHEN 10-325 MG PO TABS: 1.0000 | ORAL_TABLET | Freq: Four times a day (QID) | ORAL | 0 refills | Status: DC | PRN

## 2016-05-29 MED ORDER — CIPROFLOXACIN HCL 500 MG PO TABS: 500.0000 mg | ORAL_TABLET | Freq: Two times a day (BID) | ORAL | 0 refills | Status: DC

## 2016-05-29 MED ORDER — HYDROCODONE-ACETAMINOPHEN 10-325 MG PO TABS
1.0000 | ORAL_TABLET | ORAL | Status: DC | PRN
  Administered 2016-05-29: 1 via ORAL
  Filled 2016-05-29: qty 1

## 2016-05-29 NOTE — Progress Notes (Signed)
Subjective: 1 Day Post-Op Procedure(s) (LRB): TOTAL KNEE ARTHROPLASTY (Right) Patient reports pain as mild. Ambulating in hall with physical therapy.  Taking by mouth and voiding okay.  Had some dysuria yesterday.. " I'm ready to go home "   Objective: Vital signs in last 24 hours: Temp:  [97.3 F (36.3 C)-97.6 F (36.4 C)] 97.6 F (36.4 C) (12/16 0554) Pulse Rate:  [58-65] 60 (12/16 0554) Resp:  [11-16] 16 (12/15 1047) BP: (110-173)/(57-96) 150/62 (12/16 0554) SpO2:  [94 %-100 %] 98 % (12/16 0554)  Intake/Output from previous day: 12/15 0701 - 12/16 0700 In: 1841.7 [P.O.:240; I.V.:1401.7; IV Piggyback:200] Out: 225 [Urine:150; Blood:75] Intake/Output this shift: No intake/output data recorded.   Recent Labs  05/29/16 0446  HGB 10.8*    Recent Labs  05/29/16 0446  WBC 9.7  RBC 3.23*  HCT 32.5*  PLT 201    Recent Labs  05/29/16 0446  NA 135  K 4.7  CL 105  CO2 22  BUN 11  CREATININE 1.11*  GLUCOSE 135*  CALCIUM 8.3*   No results for input(s): LABPT, INR in the last 72 hours. In and out catheter.  Urinalysis yesterday showed increased WBCs.TNTC Right knee exam: Neurovascular intact Sensation intact distally Intact pulses distally Dorsiflexion/Plantar flexion intact Incision: dressing C/D/I Compartment soft  Assessment/Plan: 1 Day Post-Op Procedure(s) (LRB): TOTAL KNEE ARTHROPLASTY (Right)  Urinary tract infection. Plan: cipro 500 mg twice daily  5 days. Discharge time with home health PT. Weightbearing as tolerated on right. Changed pain medicine to Norco 10 mg.Hold methotrexate, which she was on preop2  Weeks. Aspirin 325 mg twice daily 1 month postop for DVT prophylaxis. Up with therapy Discharge home with home health Follow-up with Dr. Berenice Primas in 2 weeks. Prim Morace G 05/29/2016, 9:37 AM

## 2016-05-29 NOTE — Care Management Note (Signed)
Case Management Note  Patient Details  Name: Jennifer Goodman MRN: [id number] Date of Birth: 1935/05/22  Subjective/Objective:  Right TKA                   Action/Plan: Discharge Planning: AVS reviewed:  NCM spoke to pt and offered choice for [facility] PT. Was preoperatively arranged with [facility]. Pt agreeable to [facility] for HH. Pt states she has CPM at home. Not sure what company delivered. Has RW and 3n1 bedside commode.   PCP Thressa Sheller MD  Expected Discharge Date:  05/29/2016              Expected Discharge Plan:  [place]  In-House Referral:  NA  Discharge planning Services  CM Consult  Post Acute Care Choice:  Home Health Choice offered to:  Patient  DME Arranged:  CPM DME Agency:  [employer]  HH Arranged:  PT HH Agency:  [place]  Status of Service:  Completed, signed off  If discussed at [place] of Stay Meetings, dates discussed:    Additional Comments:  Erenest Rasher, RN 05/29/2016, 10:33 AM

## 2016-05-29 NOTE — Progress Notes (Signed)
Physical Therapy Treatment Patient Details Name: Jennifer Goodman MRN: [id number] DOB: 07/23/1934 Today's Date: 05/29/2016    History of Present Illness Pt is a 81 yo female admitted on 05/28/16 for right TKA. PMH significant for L TKA 06/20/15, OP, hypothyroid.     PT Comments    Pt is POD 1 and moving well with therapy. Performed gait and stair negotiation training with decreased antalgia noted and improved weight bearing bilaterally. Pt has improved ROM as noted below. Pt requests not to wear immobilizer and is able to maintain a SLR without assistance.   Follow Up Recommendations  Home health PT     Equipment Recommendations  None recommended by PT    Recommendations for Other Services       Precautions / Restrictions Precautions Precautions: Knee Precaution Booklet Issued: Yes (comment) Precaution Comments: Sheet given and reviewed. No pillow under knee Required Braces or Orthoses: Knee Immobilizer - Right Knee Immobilizer - Right: On when out of bed or walking Restrictions Weight Bearing Restrictions: Yes RLE Weight Bearing: Weight bearing as tolerated    Mobility  Bed Mobility               General bed mobility comments: Recieved in recliner  Transfers Overall transfer level: Needs assistance Equipment used: Rolling walker (2 wheeled) Transfers: Sit to/from Stand Sit to Stand: Supervision         General transfer comment: cues for hand placement  Ambulation/Gait Ambulation/Gait assistance: Supervision Ambulation Distance (Feet): 200 Feet Assistive device: Rolling walker (2 wheeled) Gait Pattern/deviations: Step-through pattern;Decreased stance time - right;Decreased weight shift to right Gait velocity: decreased Gait velocity interpretation: Below normal speed for age/gender General Gait Details: cues for posture and proximity of RW; slow, steady gait; mildly antalgic   Stairs Stairs: Yes   Stair Management: One rail Right;Forwards;Step to  pattern Number of Stairs: 12 General stair comments: pt with good recall of sequencing and technique  Wheelchair Mobility    Modified Rankin (Stroke Patients Only)       Balance                                    Cognition Arousal/Alertness: Awake/alert Behavior During Therapy: WFL for tasks assessed/performed Overall Cognitive Status: Within Functional Limits for tasks assessed                      Exercises Total Joint Exercises Ankle Circles/Pumps: AROM;Both;20 reps;Supine Quad Sets: AROM;Right;10 reps;Supine Towel Squeeze: Left;10 reps;Supine;AROM Short Arc Quad: AROM;Right;10 reps;Supine Heel Slides: AROM;Right;10 reps Hip ABduction/ADduction: AROM;Right;10 reps;Supine Straight Leg Raises: Right;10 reps;Supine;AROM Goniometric ROM: 2-95    General Comments        Pertinent Vitals/Pain Pain Assessment: 0-10 Pain Score: 4  Pain Location: right knee Pain Descriptors / Indicators: Sore Pain Intervention(s): Monitored during session;Premedicated before session;Ice applied    Home Living                      Prior Function            PT Goals (current goals can now be found in the care plan section) Acute Rehab PT Goals Patient Stated Goal: to get home tomorrow Progress towards PT goals: Progressing toward goals    Frequency    7X/week      PT Plan Current plan remains appropriate    Co-evaluation  End of Session Equipment Utilized During Treatment: Gait belt Activity Tolerance: Patient tolerated treatment well Patient left: in chair;with call bell/phone within reach     Time: [id number] PT Time Calculation (min) (ACUTE ONLY): 35 min  Charges:  $Gait Training: 8-22 mins $Therapeutic Exercise: 8-22 mins                    G Codes:      Scheryl Marten PT, DPT  [phone]  05/29/2016, 10:49 AM

## 2016-05-30 NOTE — Discharge Summary (Signed)
Patient ID: Jennifer Goodman MRN: [id number] DOB/AGE: 09/08/1934 81 y.o.  Admit date: 05/28/2016 Discharge date: 05/29/2016  Admission Diagnoses:  Principal Problem:   Primary osteoarthritis of right knee Active Problems:   Infection of urinary tract   Discharge Diagnoses:  Same  Past Medical History:  Diagnosis Date  . Anemia   . Arthritis    Rhuematoid-takes Enbrel and Methorexate   . Back pain    states in Jan 2017 spinal was attempted 4 times and has had back pain since.  . Diverticulosis   . History of colon polyps    benign  . Hypertension    takes Avapro daily  . Hypothyroidism    takes Synthroid daily  . Joint pain   . Pneumonia    at age 66  . PONV (postoperative nausea and vomiting)   . Urinary frequency   . Urinary urgency    was taking Myrbetriq but stopped.Plans to start back    Surgeries: Procedure(s):Right TOTAL KNEE ARTHROPLASTY on 05/28/2016 Discharged Condition: Improved  Hospital Course: Jennifer Goodman is an 81 y.o. female who was admitted 05/28/2016 for operative treatment ofPrimary osteoarthritis of right knee. Patient has severe unremitting pain that affects sleep, daily activities, and work/hobbies. After pre-op clearance the patient was taken to the operating room on 05/28/2016 and underwent  Procedure(s): Right TOTAL KNEE ARTHROPLASTY.    Patient was given perioperative antibiotics: Anti-infectives    Start     Dose/Rate Route Frequency Ordered Stop   05/29/16 0000  ciprofloxacin (CIPRO) 500 MG tablet  Status:  Discontinued     500 mg Oral 2 times daily 05/29/16 0857 05/29/16    05/29/16 0000  ciprofloxacin (CIPRO) 500 MG tablet     500 mg Oral 2 times daily 05/29/16 0859     05/28/16 1200  ceFAZolin (ANCEF) IVPB 2g/100 mL premix     2 g 200 mL/hr over 30 Minutes Intravenous Every 6 hours 05/28/16 1036 05/28/16 1831   05/28/16 0529  ceFAZolin (ANCEF) IVPB 2g/100 mL premix     2 g 200 mL/hr over 30 Minutes Intravenous On call to O.R.  05/28/16 0529 05/28/16 0755       Patient was given sequential compression devices, early ambulation, and chemoprophylaxis to prevent DVT. She did have some urinary dysuria. In and out cath was done and urinalysis showed white blood cells. Cultures were positive for Pseudomonas. He was started on Cipro 500 mg twice daily  Patient benefited maximally from hospital stay and there were no complications.    Recent vital signs:    Recent laboratory studies:  Recent Labs  05/29/16 0446  WBC 9.7  HGB 10.8*  HCT 32.5*  PLT 201  NA 135  K 4.7  CL 105  CO2 22  BUN 11  CREATININE 1.11*  GLUCOSE 135*  CALCIUM 8.3*    1d ago  Specimen Description URINE, RANDOM   Special Requests NONE   Culture >=100,000 COLONIES/mL PSEUDOMONAS AERUGINOSA    Report Status PENDING   Results for Jennifer Goodman, Jennifer Goodman (MRN [id number]) as of 05/30/2016 10:03  Ref. Range 05/18/2016 11:40 05/29/2016 07:00  Appearance Latest Ref Range: CLEAR  HAZY (A) CLOUDY (A)  Bacteria, UA Latest Ref Range: NONE SEEN  RARE (A) RARE (A)  Bilirubin Urine Latest Ref Range: NEGATIVE  NEGATIVE NEGATIVE  Color, Urine Latest Ref Range: YELLOW  YELLOW YELLOW  Glucose Latest Ref Range: NEGATIVE mg/dL NEGATIVE 50 (A)  Hgb urine dipstick Latest Ref Range: NEGATIVE  SMALL (A) SMALL (A)  Hyaline Casts, UA Unknown PRESENT   Ketones, ur Latest Ref Range: NEGATIVE mg/dL NEGATIVE 5 (A)  Leukocytes, UA Latest Ref Range: NEGATIVE  LARGE (A) LARGE (A)  Mucous Unknown PRESENT PRESENT  Nitrite Latest Ref Range: NEGATIVE  NEGATIVE NEGATIVE  pH Latest Ref Range: 5.0 - 8.0  5.0 5.0  Protein Latest Ref Range: NEGATIVE mg/dL NEGATIVE 30 (A)  RBC / HPF Latest Ref Range: 0 - 5 RBC/hpf 6-30 6-30  Specific Gravity, Urine Latest Ref Range: 1.005 - 1.030  1.012 1.023  Squamous Epithelial / LPF Latest Ref Range: NONE SEEN  0-5 (A) 0-5 (A)  WBC Clumps Unknown PRESENT PRESENT  WBC, UA Latest Ref Range: 0 - 5 WBC/hpf TOO NUMEROUS TO C... TOO NUMEROUS TO C...       Discharge Medications:   Allergies as of 05/29/2016      Reactions   No Known Allergies       Medication List    TAKE these medications   aspirin EC 325 MG tablet Take 1 tablet (325 mg total) by mouth 2 (two) times daily after a meal. Take x 1 month post op to decrease risk of blood clots.   ciprofloxacin 500 MG tablet Commonly known as:  CIPRO Take 1 tablet (500 mg total) by mouth 2 (two) times daily.   cyanocobalamin 1000 MCG/ML injection Commonly known as:  (VITAMIN B-12) Inject 1,000 mcg into the muscle every 30 (thirty) days.   ENBREL 50 MG/ML injection Generic drug:  etanercept Inject 50 mg into the skin once a week. Thursdays   fentaNYL 25 MCG/HR patch Commonly known as:  [place] - dosed mcg/hr Place 1 patch onto the skin every 3 (three) days.   folic acid 1 MG tablet Commonly known as:  FOLVITE Take 1 mg by mouth daily.   gabapentin 100 MG capsule Commonly known as:  NEURONTIN Take 300 mg by mouth at bedtime as needed (pain).   HYDROcodone-acetaminophen 10-325 MG tablet Commonly known as:  NORCO Take 1-2 tablets by mouth every 6 (six) hours as needed for moderate pain or severe pain.   irbesartan 150 MG tablet Commonly known as:  AVAPRO Take 150 mg by mouth daily.   levothyroxine 50 MCG tablet Commonly known as:  SYNTHROID, LEVOTHROID Take 50 mcg by mouth daily before breakfast.   methotrexate 2.5 MG tablet Commonly known as:  RHEUMATREX Take 20 mg by mouth once a week.   MYRBETRIQ 50 MG Tb24 tablet Generic drug:  mirabegron ER Take 50 mg by mouth daily.   tiZANidine 2 MG tablet Commonly known as:  ZANAFLEX Take 1 tablet (2 mg total) by mouth every 8 (eight) hours as needed for muscle spasms.   ULTRAFLORA IMMUNE HEALTH PO Take 1 capsule by mouth daily.   Vitamin D3 5000 units Tabs Take 1 tablet by mouth daily.       Diagnostic Studies:  Disposition: 01-Home or Self Care  Discharge Instructions    CPM    Complete by:  As  directed    Continuous passive motion machine (CPM):      Use the CPM from 0 to 60 for 8 hours per day.      You may increase by 5-10 per day.  You may break it up into 2 or 3 sessions per day.      Use CPM for 1-2 weeks or until you are told to stop.   Call MD / Call 911    Complete by:  As directed  If you experience chest pain or shortness of breath, CALL 911 and be transported to the hospital emergency room.  If you develope a fever above 101 F, pus (white drainage) or increased drainage or redness at the wound, or calf pain, call your surgeon's office.   Constipation Prevention    Complete by:  As directed    Drink plenty of fluids.  Prune juice may be helpful.  You may use a stool softener, such as Colace (over the counter) 100 mg twice a day.  Use MiraLax (over the counter) for constipation as needed.   Diet general    Complete by:  As directed    Do not put a pillow under the knee. Place it under the heel.    Complete by:  As directed    Increase activity slowly as tolerated    Complete by:  As directed    Weight bearing as tolerated    Complete by:  As directed    Laterality:  right   Extremity:  Lower      Follow-up Information    GRAVES,JOHN L, MD. Schedule an appointment as soon as possible for a visit in 2 week(s).   Specialty:  Orthopedic Surgery Contact information: [place] [address] [address] Follow up.   Why:  Home Health Physical Therapy Contact information: [address] [place] [address] [phone]            Signed: Erlene Senters 05/30/2016, 10:01 AM

## 2016-05-31 ENCOUNTER — Encounter (HOSPITAL_COMMUNITY): Payer: Self-pay | Admitting: Orthopedic Surgery

## 2016-05-31 DIAGNOSIS — Z471 Aftercare following joint replacement surgery: Secondary | ICD-10-CM | POA: Diagnosis not present

## 2016-05-31 DIAGNOSIS — Z79891 Long term (current) use of opiate analgesic: Secondary | ICD-10-CM | POA: Diagnosis not present

## 2016-05-31 DIAGNOSIS — R3915 Urgency of urination: Secondary | ICD-10-CM | POA: Diagnosis not present

## 2016-05-31 DIAGNOSIS — E538 Deficiency of other specified B group vitamins: Secondary | ICD-10-CM | POA: Diagnosis not present

## 2016-05-31 DIAGNOSIS — Z8719 Personal history of other diseases of the digestive system: Secondary | ICD-10-CM | POA: Diagnosis not present

## 2016-05-31 DIAGNOSIS — M069 Rheumatoid arthritis, unspecified: Secondary | ICD-10-CM | POA: Diagnosis not present

## 2016-05-31 DIAGNOSIS — Z96652 Presence of left artificial knee joint: Secondary | ICD-10-CM | POA: Diagnosis not present

## 2016-05-31 DIAGNOSIS — E039 Hypothyroidism, unspecified: Secondary | ICD-10-CM | POA: Diagnosis not present

## 2016-05-31 DIAGNOSIS — Z86010 Personal history of colonic polyps: Secondary | ICD-10-CM | POA: Diagnosis not present

## 2016-05-31 DIAGNOSIS — I10 Essential (primary) hypertension: Secondary | ICD-10-CM | POA: Diagnosis not present

## 2016-05-31 DIAGNOSIS — M810 Age-related osteoporosis without current pathological fracture: Secondary | ICD-10-CM | POA: Diagnosis not present

## 2016-05-31 DIAGNOSIS — Z96651 Presence of right artificial knee joint: Secondary | ICD-10-CM | POA: Diagnosis not present

## 2016-05-31 LAB — URINE CULTURE: Culture: >=100000 — AB

## 2016-05-31 NOTE — Op Note (Signed)
NAME:  Jennifer Goodman, Jennifer Goodman                      ACCOUNT NO.:  MEDICAL RECORD NO.:  [id number]  LOCATION:                                 FACILITY:  PHYSICIAN:  Alta Corning, M.D.        DATE OF BIRTH:  DATE OF PROCEDURE:  05/28/2016 DATE OF DISCHARGE:                              OPERATIVE REPORT   PREOPERATIVE DIAGNOSIS:  End-stage degenerative joint disease, right knee with bone-on-bone change.  POSTPROCEDURE DIAGNOSIS:  End-stage degenerative joint disease, right knee with bone-on-bone change.  PROCEDURE:  Right total knee replacement with an Attune system size 5 femur, size 5 tibia, 7 mm bridging bearing, and a 38 mm all polyethylene patella.  SURGEON:  Alta Corning, M.D.  ASSISTANT:  Gary Fleet, P.A.  ANESTHESIA:  General.  BRIEF HISTORY:  Ms. Mccague is a 81 year old female with a long history significant complaints of bilateral knee pain.  She had been treated with left total knee replacement last year.  She did great with that. She was having right knee pain.  We treated her with injection therapy, viscous supplementation, activity modification, and after failure of all conservative care, she was taken to the operating room for right total knee replacement.  The patient was having night pain and light activity pain.  DESCRIPTION OF PROCEDURE:  The procedure was taken to the operating room.  After adequate anesthesia was obtained with general anesthetic, the patient was placed supine on the operating table.  The right leg was prepped and draped in usual sterile fashion.  Following this, the leg was exsanguinated.  Blood pressure tourniquet was inflated to 300 mmHg. Following this, a midline incision was made in subcutaneous tissue down to the level of extensor mechanism and medial parapatellar arthrotomy was undertaken.  Attention was then turned to the knee where the anterior synovium was removed, retropatellar fat pad, anterior and posterior cruciates, and medial  and lateral meniscus were removed.  At this point, an intramedullary pilot hole was drilled in the femur and the guide was then placed with a 5-degree valgus inclination and the distal femoral cut was made, 9 mm of distal bone was resected. Following this, the rotational alignment was achieved and a size 5 block was chosen, anterior-posterior cuts were made, chamfers and box. Attention was then turned to the tibia, where it was cut perpendicular to its long axis with 3 degrees of posterior slope and once this was achieved the spacer blocks were put in place.  Size 7 block was appropriate one.  The medial and lateral meniscus were removed.  The posterior capsule was cauterized and Exparel was injected throughout the posterior capsule.  Following this, the tibia was drilled and keeled to the size 5 tibial implant, size 7 bearing was placed and knee put through a range of motion.  The patella was cut down to a level 13 mm and 38 paddle was chosen.  Lugs were drilled and a trial was placed. Knee put through a range of motion, excellent stability and range of motion were achieved.  All trials were removed.  The knee was copiously and thoroughly lavaged with pulse lavage irrigation.  Some Exparel was applied to the bone and the knee was completely dried and the final components were cemented into place with size 5 tibia, size 5 femur, and size 7 bridging bearing was placed as a trial and attention was turned to the patella with 38 all poly patella was placed and held with a clamp.  All excess bone and cement were removed.  Once the cement was completely hardened, tourniquet was let down.  All bleeding was controlled with electrocautery.  The knee was checked for stability and range of motion, it is excellent.  At that point, the trial was removed and the final size 7 was placed and the knee was irrigated one last time.  The medial parapatellar arthrotomy was closed with 1 Vicryl running, skin  with 0 and 2-0 Vicryl, and 3-0 Monocryl subcuticular. Benzoin and Steri-Strips were applied.  Sterile compressive dressing was applied.  The patient was taken to the recovery and noted to be in satisfactory condition with estimated blood loss for procedure being minimal.     Alta Corning, M.D.     Corliss Skains  D:  05/28/2016  T:  05/29/2016  Job:  [id number]  cc:   Alta Corning, M.D.

## 2016-06-01 DIAGNOSIS — M069 Rheumatoid arthritis, unspecified: Secondary | ICD-10-CM | POA: Diagnosis not present

## 2016-06-01 DIAGNOSIS — Z471 Aftercare following joint replacement surgery: Secondary | ICD-10-CM | POA: Diagnosis not present

## 2016-06-01 DIAGNOSIS — R3915 Urgency of urination: Secondary | ICD-10-CM | POA: Diagnosis not present

## 2016-06-01 DIAGNOSIS — M810 Age-related osteoporosis without current pathological fracture: Secondary | ICD-10-CM | POA: Diagnosis not present

## 2016-06-01 DIAGNOSIS — E039 Hypothyroidism, unspecified: Secondary | ICD-10-CM | POA: Diagnosis not present

## 2016-06-01 DIAGNOSIS — Z79891 Long term (current) use of opiate analgesic: Secondary | ICD-10-CM | POA: Diagnosis not present

## 2016-06-01 DIAGNOSIS — Z8719 Personal history of other diseases of the digestive system: Secondary | ICD-10-CM | POA: Diagnosis not present

## 2016-06-01 DIAGNOSIS — E538 Deficiency of other specified B group vitamins: Secondary | ICD-10-CM | POA: Diagnosis not present

## 2016-06-01 DIAGNOSIS — Z96651 Presence of right artificial knee joint: Secondary | ICD-10-CM | POA: Diagnosis not present

## 2016-06-01 DIAGNOSIS — Z86010 Personal history of colonic polyps: Secondary | ICD-10-CM | POA: Diagnosis not present

## 2016-06-01 DIAGNOSIS — Z96652 Presence of left artificial knee joint: Secondary | ICD-10-CM | POA: Diagnosis not present

## 2016-06-01 DIAGNOSIS — I10 Essential (primary) hypertension: Secondary | ICD-10-CM | POA: Diagnosis not present

## 2016-06-03 DIAGNOSIS — Z8719 Personal history of other diseases of the digestive system: Secondary | ICD-10-CM | POA: Diagnosis not present

## 2016-06-03 DIAGNOSIS — Z96651 Presence of right artificial knee joint: Secondary | ICD-10-CM | POA: Diagnosis not present

## 2016-06-03 DIAGNOSIS — E538 Deficiency of other specified B group vitamins: Secondary | ICD-10-CM | POA: Diagnosis not present

## 2016-06-03 DIAGNOSIS — Z471 Aftercare following joint replacement surgery: Secondary | ICD-10-CM | POA: Diagnosis not present

## 2016-06-03 DIAGNOSIS — M069 Rheumatoid arthritis, unspecified: Secondary | ICD-10-CM | POA: Diagnosis not present

## 2016-06-03 DIAGNOSIS — Z79891 Long term (current) use of opiate analgesic: Secondary | ICD-10-CM | POA: Diagnosis not present

## 2016-06-03 DIAGNOSIS — M810 Age-related osteoporosis without current pathological fracture: Secondary | ICD-10-CM | POA: Diagnosis not present

## 2016-06-03 DIAGNOSIS — Z96652 Presence of left artificial knee joint: Secondary | ICD-10-CM | POA: Diagnosis not present

## 2016-06-03 DIAGNOSIS — I10 Essential (primary) hypertension: Secondary | ICD-10-CM | POA: Diagnosis not present

## 2016-06-03 DIAGNOSIS — E039 Hypothyroidism, unspecified: Secondary | ICD-10-CM | POA: Diagnosis not present

## 2016-06-03 DIAGNOSIS — Z86010 Personal history of colonic polyps: Secondary | ICD-10-CM | POA: Diagnosis not present

## 2016-06-03 DIAGNOSIS — R3915 Urgency of urination: Secondary | ICD-10-CM | POA: Diagnosis not present

## 2016-06-08 DIAGNOSIS — M069 Rheumatoid arthritis, unspecified: Secondary | ICD-10-CM | POA: Diagnosis not present

## 2016-06-08 DIAGNOSIS — Z79891 Long term (current) use of opiate analgesic: Secondary | ICD-10-CM | POA: Diagnosis not present

## 2016-06-08 DIAGNOSIS — R3915 Urgency of urination: Secondary | ICD-10-CM | POA: Diagnosis not present

## 2016-06-08 DIAGNOSIS — Z96652 Presence of left artificial knee joint: Secondary | ICD-10-CM | POA: Diagnosis not present

## 2016-06-08 DIAGNOSIS — Z86010 Personal history of colonic polyps: Secondary | ICD-10-CM | POA: Diagnosis not present

## 2016-06-08 DIAGNOSIS — Z96651 Presence of right artificial knee joint: Secondary | ICD-10-CM | POA: Diagnosis not present

## 2016-06-08 DIAGNOSIS — E039 Hypothyroidism, unspecified: Secondary | ICD-10-CM | POA: Diagnosis not present

## 2016-06-08 DIAGNOSIS — Z8719 Personal history of other diseases of the digestive system: Secondary | ICD-10-CM | POA: Diagnosis not present

## 2016-06-08 DIAGNOSIS — M810 Age-related osteoporosis without current pathological fracture: Secondary | ICD-10-CM | POA: Diagnosis not present

## 2016-06-08 DIAGNOSIS — I10 Essential (primary) hypertension: Secondary | ICD-10-CM | POA: Diagnosis not present

## 2016-06-08 DIAGNOSIS — Z471 Aftercare following joint replacement surgery: Secondary | ICD-10-CM | POA: Diagnosis not present

## 2016-06-08 DIAGNOSIS — E538 Deficiency of other specified B group vitamins: Secondary | ICD-10-CM | POA: Diagnosis not present

## 2016-06-09 DIAGNOSIS — E538 Deficiency of other specified B group vitamins: Secondary | ICD-10-CM | POA: Diagnosis not present

## 2016-06-14 DIAGNOSIS — Z96651 Presence of right artificial knee joint: Secondary | ICD-10-CM | POA: Diagnosis not present

## 2016-06-14 DIAGNOSIS — M1711 Unilateral primary osteoarthritis, right knee: Secondary | ICD-10-CM | POA: Diagnosis not present

## 2016-06-15 DIAGNOSIS — M1711 Unilateral primary osteoarthritis, right knee: Secondary | ICD-10-CM | POA: Diagnosis not present

## 2016-06-16 DIAGNOSIS — R3915 Urgency of urination: Secondary | ICD-10-CM | POA: Diagnosis not present

## 2016-06-16 DIAGNOSIS — M810 Age-related osteoporosis without current pathological fracture: Secondary | ICD-10-CM | POA: Diagnosis not present

## 2016-06-16 DIAGNOSIS — M069 Rheumatoid arthritis, unspecified: Secondary | ICD-10-CM | POA: Diagnosis not present

## 2016-06-16 DIAGNOSIS — Z79891 Long term (current) use of opiate analgesic: Secondary | ICD-10-CM | POA: Diagnosis not present

## 2016-06-16 DIAGNOSIS — Z96651 Presence of right artificial knee joint: Secondary | ICD-10-CM | POA: Diagnosis not present

## 2016-06-16 DIAGNOSIS — E039 Hypothyroidism, unspecified: Secondary | ICD-10-CM | POA: Diagnosis not present

## 2016-06-16 DIAGNOSIS — E538 Deficiency of other specified B group vitamins: Secondary | ICD-10-CM | POA: Diagnosis not present

## 2016-06-16 DIAGNOSIS — Z86010 Personal history of colonic polyps: Secondary | ICD-10-CM | POA: Diagnosis not present

## 2016-06-16 DIAGNOSIS — Z8719 Personal history of other diseases of the digestive system: Secondary | ICD-10-CM | POA: Diagnosis not present

## 2016-06-16 DIAGNOSIS — Z96652 Presence of left artificial knee joint: Secondary | ICD-10-CM | POA: Diagnosis not present

## 2016-06-16 DIAGNOSIS — Z471 Aftercare following joint replacement surgery: Secondary | ICD-10-CM | POA: Diagnosis not present

## 2016-06-16 DIAGNOSIS — I10 Essential (primary) hypertension: Secondary | ICD-10-CM | POA: Diagnosis not present

## 2016-06-17 DIAGNOSIS — M0589 Other rheumatoid arthritis with rheumatoid factor of multiple sites: Secondary | ICD-10-CM | POA: Diagnosis not present

## 2016-06-17 DIAGNOSIS — Z79899 Other long term (current) drug therapy: Secondary | ICD-10-CM | POA: Diagnosis not present

## 2016-06-17 DIAGNOSIS — M25561 Pain in right knee: Secondary | ICD-10-CM | POA: Diagnosis not present

## 2016-06-17 DIAGNOSIS — M150 Primary generalized (osteo)arthritis: Secondary | ICD-10-CM | POA: Diagnosis not present

## 2016-06-17 DIAGNOSIS — G894 Chronic pain syndrome: Secondary | ICD-10-CM | POA: Diagnosis not present

## 2016-06-17 DIAGNOSIS — M25661 Stiffness of right knee, not elsewhere classified: Secondary | ICD-10-CM | POA: Diagnosis not present

## 2016-06-17 DIAGNOSIS — M25551 Pain in right hip: Secondary | ICD-10-CM | POA: Diagnosis not present

## 2016-06-17 DIAGNOSIS — Z96651 Presence of right artificial knee joint: Secondary | ICD-10-CM | POA: Diagnosis not present

## 2016-06-21 DIAGNOSIS — Z96651 Presence of right artificial knee joint: Secondary | ICD-10-CM | POA: Diagnosis not present

## 2016-06-21 DIAGNOSIS — M25551 Pain in right hip: Secondary | ICD-10-CM | POA: Diagnosis not present

## 2016-06-21 DIAGNOSIS — M25661 Stiffness of right knee, not elsewhere classified: Secondary | ICD-10-CM | POA: Diagnosis not present

## 2016-06-23 DIAGNOSIS — M25551 Pain in right hip: Secondary | ICD-10-CM | POA: Diagnosis not present

## 2016-06-23 DIAGNOSIS — M25661 Stiffness of right knee, not elsewhere classified: Secondary | ICD-10-CM | POA: Diagnosis not present

## 2016-06-23 DIAGNOSIS — Z96651 Presence of right artificial knee joint: Secondary | ICD-10-CM | POA: Diagnosis not present

## 2016-06-28 DIAGNOSIS — M25661 Stiffness of right knee, not elsewhere classified: Secondary | ICD-10-CM | POA: Diagnosis not present

## 2016-06-28 DIAGNOSIS — Z96651 Presence of right artificial knee joint: Secondary | ICD-10-CM | POA: Diagnosis not present

## 2016-06-28 DIAGNOSIS — M25551 Pain in right hip: Secondary | ICD-10-CM | POA: Diagnosis not present

## 2016-07-05 DIAGNOSIS — Z96651 Presence of right artificial knee joint: Secondary | ICD-10-CM | POA: Diagnosis not present

## 2016-07-05 DIAGNOSIS — M25661 Stiffness of right knee, not elsewhere classified: Secondary | ICD-10-CM | POA: Diagnosis not present

## 2016-07-05 DIAGNOSIS — M25551 Pain in right hip: Secondary | ICD-10-CM | POA: Diagnosis not present

## 2016-07-06 DIAGNOSIS — M1711 Unilateral primary osteoarthritis, right knee: Secondary | ICD-10-CM | POA: Diagnosis not present

## 2016-07-08 DIAGNOSIS — L570 Actinic keratosis: Secondary | ICD-10-CM | POA: Diagnosis not present

## 2016-07-08 DIAGNOSIS — D225 Melanocytic nevi of trunk: Secondary | ICD-10-CM | POA: Diagnosis not present

## 2016-07-08 DIAGNOSIS — Z96651 Presence of right artificial knee joint: Secondary | ICD-10-CM | POA: Diagnosis not present

## 2016-07-08 DIAGNOSIS — D692 Other nonthrombocytopenic purpura: Secondary | ICD-10-CM | POA: Diagnosis not present

## 2016-07-08 DIAGNOSIS — Z85828 Personal history of other malignant neoplasm of skin: Secondary | ICD-10-CM | POA: Diagnosis not present

## 2016-07-08 DIAGNOSIS — L821 Other seborrheic keratosis: Secondary | ICD-10-CM | POA: Diagnosis not present

## 2016-07-08 DIAGNOSIS — M25661 Stiffness of right knee, not elsewhere classified: Secondary | ICD-10-CM | POA: Diagnosis not present

## 2016-07-08 DIAGNOSIS — M25551 Pain in right hip: Secondary | ICD-10-CM | POA: Diagnosis not present

## 2016-07-13 DIAGNOSIS — E538 Deficiency of other specified B group vitamins: Secondary | ICD-10-CM | POA: Diagnosis not present

## 2016-08-03 DIAGNOSIS — M1711 Unilateral primary osteoarthritis, right knee: Secondary | ICD-10-CM | POA: Diagnosis not present

## 2016-08-17 DIAGNOSIS — E538 Deficiency of other specified B group vitamins: Secondary | ICD-10-CM | POA: Diagnosis not present

## 2016-09-20 DIAGNOSIS — G894 Chronic pain syndrome: Secondary | ICD-10-CM | POA: Diagnosis not present

## 2016-09-20 DIAGNOSIS — M25561 Pain in right knee: Secondary | ICD-10-CM | POA: Diagnosis not present

## 2016-09-20 DIAGNOSIS — Z79899 Other long term (current) drug therapy: Secondary | ICD-10-CM | POA: Diagnosis not present

## 2016-09-20 DIAGNOSIS — M0589 Other rheumatoid arthritis with rheumatoid factor of multiple sites: Secondary | ICD-10-CM | POA: Diagnosis not present

## 2016-09-20 DIAGNOSIS — M150 Primary generalized (osteo)arthritis: Secondary | ICD-10-CM | POA: Diagnosis not present

## 2016-09-20 DIAGNOSIS — E538 Deficiency of other specified B group vitamins: Secondary | ICD-10-CM | POA: Diagnosis not present

## 2016-10-08 DIAGNOSIS — E039 Hypothyroidism, unspecified: Secondary | ICD-10-CM | POA: Diagnosis not present

## 2016-10-08 DIAGNOSIS — E538 Deficiency of other specified B group vitamins: Secondary | ICD-10-CM | POA: Diagnosis not present

## 2016-10-08 DIAGNOSIS — I10 Essential (primary) hypertension: Secondary | ICD-10-CM | POA: Diagnosis not present

## 2016-10-12 DIAGNOSIS — H26493 Other secondary cataract, bilateral: Secondary | ICD-10-CM | POA: Diagnosis not present

## 2016-12-20 DIAGNOSIS — G894 Chronic pain syndrome: Secondary | ICD-10-CM | POA: Diagnosis not present

## 2016-12-20 DIAGNOSIS — M150 Primary generalized (osteo)arthritis: Secondary | ICD-10-CM | POA: Diagnosis not present

## 2016-12-20 DIAGNOSIS — Z79899 Other long term (current) drug therapy: Secondary | ICD-10-CM | POA: Diagnosis not present

## 2016-12-20 DIAGNOSIS — M0589 Other rheumatoid arthritis with rheumatoid factor of multiple sites: Secondary | ICD-10-CM | POA: Diagnosis not present

## 2016-12-20 DIAGNOSIS — M25561 Pain in right knee: Secondary | ICD-10-CM | POA: Diagnosis not present

## 2017-01-06 DIAGNOSIS — L821 Other seborrheic keratosis: Secondary | ICD-10-CM | POA: Diagnosis not present

## 2017-01-06 DIAGNOSIS — L814 Other melanin hyperpigmentation: Secondary | ICD-10-CM | POA: Diagnosis not present

## 2017-01-06 DIAGNOSIS — L218 Other seborrheic dermatitis: Secondary | ICD-10-CM | POA: Diagnosis not present

## 2017-01-06 DIAGNOSIS — L570 Actinic keratosis: Secondary | ICD-10-CM | POA: Diagnosis not present

## 2017-01-06 DIAGNOSIS — Z85828 Personal history of other malignant neoplasm of skin: Secondary | ICD-10-CM | POA: Diagnosis not present

## 2017-01-10 DIAGNOSIS — M25562 Pain in left knee: Secondary | ICD-10-CM | POA: Diagnosis not present

## 2017-01-10 DIAGNOSIS — Z09 Encounter for follow-up examination after completed treatment for conditions other than malignant neoplasm: Secondary | ICD-10-CM | POA: Diagnosis not present

## 2017-01-10 DIAGNOSIS — Z96652 Presence of left artificial knee joint: Secondary | ICD-10-CM | POA: Diagnosis not present

## 2017-01-25 DIAGNOSIS — M25562 Pain in left knee: Secondary | ICD-10-CM | POA: Diagnosis not present

## 2017-01-28 DIAGNOSIS — M25562 Pain in left knee: Secondary | ICD-10-CM | POA: Diagnosis not present

## 2017-01-28 DIAGNOSIS — Z96653 Presence of artificial knee joint, bilateral: Secondary | ICD-10-CM | POA: Diagnosis not present

## 2017-02-01 DIAGNOSIS — Z96653 Presence of artificial knee joint, bilateral: Secondary | ICD-10-CM | POA: Diagnosis not present

## 2017-02-01 DIAGNOSIS — M25562 Pain in left knee: Secondary | ICD-10-CM | POA: Diagnosis not present

## 2017-02-02 DIAGNOSIS — M25562 Pain in left knee: Secondary | ICD-10-CM | POA: Diagnosis not present

## 2017-02-02 DIAGNOSIS — Z96653 Presence of artificial knee joint, bilateral: Secondary | ICD-10-CM | POA: Diagnosis not present

## 2017-02-03 ENCOUNTER — Encounter: Payer: Self-pay | Admitting: *Deleted

## 2017-02-03 ENCOUNTER — Emergency Department
Admission: EM | Admit: 2017-02-03 | Discharge: 2017-02-03 | Disposition: A | Discharge status: Home / Self Care | Payer: Medicare Other | Source: Home / Self Care | Attending: Family Medicine | Admitting: Family Medicine

## 2017-02-03 DIAGNOSIS — R1013 Epigastric pain: Secondary | ICD-10-CM | POA: Diagnosis not present

## 2017-02-03 MED ORDER — PANTOPRAZOLE SODIUM 20 MG PO TBEC: 20.0000 mg | DELAYED_RELEASE_TABLET | Freq: Every day | ORAL | 0 refills | Status: DC

## 2017-02-03 NOTE — ED Triage Notes (Signed)
Patient c/o 3 days of central, dull sometimes burning constant abdominal pain. Pain is worse after eating. She also c/o nausea and has vomited x 1. She reports she has been taking a lot of IBF for her knee pain.

## 2017-02-03 NOTE — ED Provider Notes (Signed)
[facility]    CSN: [id number] Arrival date & time: 02/03/17  1337     History   Chief Complaint Chief Complaint  Patient presents with  . Abdominal Pain    HPI Jennifer Goodman is a 82 y.o. female.   Patient complains of three day history of dull, burning epigastric discomfort generally constant but worse after eating.  She has occasional nausea, and has had one episode of vomiting. Her bowel movements have been normal.  No fevers, chills, and sweats.  She reports that she has been taking ibuprofen regularly during the past two months for achilles tendonitis and recurring left knee pain. She had similar but worse epigastric pain in 2016 requiring a visit to the ER.  Her work-up at that time was negative, and she responded well to pantoprazole 20mg  daily and sucralfate 1gm QID.   The history is provided by the patient.  Abdominal Pain  Pain location:  Epigastric Pain quality: burning and dull   Pain radiates to:  Does not radiate Pain severity:  Moderate Onset quality:  Gradual Duration:  3 days Timing:  Constant Progression:  Waxing and waning Chronicity:  Recurrent Context comment:  Ibuprofen use Relieved by:  None tried Worsened by:  Eating Ineffective treatments:  None tried Associated symptoms: anorexia, fatigue and nausea   Associated symptoms: no belching, no chest pain, no chills, no constipation, no cough, no diarrhea, no dysuria, no fever, no flatus, no hematemesis, no hematochezia, no hematuria, no melena, no shortness of breath, no sore throat and no vomiting   Risk factors: being elderly and NSAID use     Past Medical History:  Diagnosis Date  . Anemia   . Arthritis    Rhuematoid-takes Enbrel and Methorexate   . Back pain    states in Jan 2017 spinal was attempted 4 times and has had back pain since.  . Diverticulosis   . History of colon polyps    benign  . Hypertension    takes Avapro daily  . Hypothyroidism    takes Synthroid daily  .  Joint pain   . Pneumonia    at age 47  . PONV (postoperative nausea and vomiting)   . Urinary frequency   . Urinary urgency    was taking Myrbetriq but stopped.Plans to start back    Patient Active Problem List   Diagnosis Date Noted  . Infection of urinary tract 05/29/2016  . Primary osteoarthritis of right knee 05/28/2016  . Primary osteoarthritis of left knee 06/20/2015  . Osteoporosis 06/20/2015  . Closed displaced fracture of medial condyle of left femur ([place]) 06/20/2015  . Abdominal pain, RLQ (right lower quadrant) 05/19/2012    Past Surgical History:  Procedure Laterality Date  . CHOLECYSTECTOMY    . COLONOSCOPY    . EYE SURGERY    . HAND TENDON SURGERY     Dr. Amedeo Plenty  . KNEE ARTHROSCOPY    . TOTAL KNEE ARTHROPLASTY Left 06/20/2015  . TOTAL KNEE ARTHROPLASTY Left 06/20/2015   Procedure: TOTAL KNEE ARTHROPLASTY;  Surgeon: Dorna Leitz, MD;  Location: [facility];  Service: Orthopedics;  Laterality: Left;  . TOTAL KNEE ARTHROPLASTY Right 05/28/2016   Procedure: TOTAL KNEE ARTHROPLASTY;  Surgeon: Dorna Leitz, MD;  Location: [facility];  Service: Orthopedics;  Laterality: Right;    OB History    No data available       Home Medications    Prior to Admission medications   Medication Sig Start Date End Date Taking?  Authorizing Provider  Cholecalciferol (VITAMIN D3) 5000 UNITS TABS Take 1 tablet by mouth daily.    [provider]  cyanocobalamin (,VITAMIN B-12,) 1000 MCG/ML injection Inject 1,000 mcg into the muscle every 30 (thirty) days.    [provider]  etanercept (ENBREL) 50 MG/ML injection Inject 50 mg into the skin once a week. Thursdays    [provider]  fentaNYL (DURAGESIC - DOSED MCG/HR) 25 MCG/HR Place 1 patch onto the skin every 3 (three) days.    [provider]  folic acid (FOLVITE) 1 MG tablet Take 1 mg by mouth daily.    [provider]  HYDROcodone-acetaminophen (NORCO) 10-325 MG tablet Take 1-2 tablets by mouth  every 6 (six) hours as needed for moderate pain or severe pain. 05/29/16   Gary Fleet, PA-C  irbesartan (AVAPRO) 150 MG tablet Take 150 mg by mouth daily.     [provider]  levothyroxine (SYNTHROID, LEVOTHROID) 50 MCG tablet Take 50 mcg by mouth daily before breakfast.    [provider]  methotrexate (RHEUMATREX) 2.5 MG tablet Take 20 mg by mouth once a week.    [provider]  pantoprazole (PROTONIX) 20 MG tablet Take 1 tablet (20 mg total) by mouth daily. 02/03/17 02/03/18  Kandra Nicolas, MD    Family History Family History  Problem Relation Age of Onset  . Cancer Father        colon ca/ lung ca  . Heart disease Father     Social History Social History  Substance Use Topics  . Smoking status: Former Smoker    Quit date: 06/14/1978  . Smokeless tobacco: Never Used     Comment: quit 32 yrs ago  . Alcohol use Yes     Comment: occ     Allergies   No known allergies   Review of Systems Review of Systems  Constitutional: Positive for fatigue. Negative for chills and fever.  HENT: Negative for sore throat.   Respiratory: Negative for cough and shortness of breath.   Cardiovascular: Negative for chest pain.  Gastrointestinal: Positive for abdominal pain, anorexia and nausea. Negative for constipation, diarrhea, flatus, hematemesis, hematochezia, melena and vomiting.  Genitourinary: Negative for dysuria and hematuria.     Physical Exam Triage Vital Signs ED Triage Vitals [02/03/17 1406]  Enc Vitals Group     BP (!) 172/78     Pulse Rate 68     Resp      Temp (!) 97.5 F (36.4 C)     Temp Source Oral     SpO2 98 %     Weight 148 lb (67.1 kg)     Height      Head Circumference      Peak Flow      Pain Score 5     Pain Loc      Pain Edu?      Excl. in [place]?    No data found.   Updated Vital Signs BP (!) 172/78 (BP Location: Left Arm)   Pulse 68   Temp (!) 97.5 F (36.4 C) (Oral)   Wt 148 lb (67.1 kg)   SpO2 98%   BMI 26.22  kg/m   Visual Acuity Right Eye Distance:   Left Eye Distance:   Bilateral Distance:    Right Eye Near:   Left Eye Near:    Bilateral Near:     Physical Exam Nursing notes and Vital Signs reviewed. Appearance:  Patient appears stated age, and in  no acute distress.    Eyes:  Pupils are equal, round, and reactive to light and accomodation.  Extraocular movement is intact.  Conjunctivae are not inflamed   Pharynx:  Normal; moist mucous membranes  Neck:  Supple.  No adenopathy Lungs:  Clear to auscultation.  Breath sounds are equal.  Moving air well. Heart:  Regular rate and rhythm without murmurs, rubs, or gallops.  Abdomen:  Minimal sub-xiphoid tenderness without masses or hepatosplenomegaly.  Bowel sounds are present.  No CVA or flank tenderness.  Extremities:  No edema.  Skin:  No rash present.     UC Treatments / Results  Labs (all labs ordered are listed, but only abnormal results are displayed) Labs Reviewed - No data to display  EKG  EKG Interpretation None       Radiology No results found.  Procedures Procedures (including critical care time)  Medications Ordered in UC Medications - No data to display   Initial Impression / Assessment and Plan / UC Course  I have reviewed the triage vital signs and the nursing notes.  Pertinent labs & imaging results that were available during my care of the patient were reviewed by me and considered in my medical decision making (see chart for details).    Reviewed previous ER notes from 05/05/15. Suspect NSAID induced gastritis.  Begin pantoprazole 20mg  daily, #30, no refill. Avoid non-steroidal anti-inflammatory drugs (ibuprofen, etc.). Followup with Family Doctor in about one week. If symptoms become significantly worse during the night or over the weekend, proceed to the local emergency room.     Final Clinical Impressions(s) / UC Diagnoses   Final diagnoses:  Abdominal pain, epigastric    New  Prescriptions New Prescriptions   PANTOPRAZOLE (PROTONIX) 20 MG TABLET    Take 1 tablet (20 mg total) by mouth daily.         Kandra Nicolas, MD 02/03/17 [id number]

## 2017-02-03 NOTE — Discharge Instructions (Signed)
Avoid non-steroidal anti-inflammatory drugs (ibuprofen, etc.). If symptoms become significantly worse during the night or over the weekend, proceed to the local emergency room.

## 2017-02-08 DIAGNOSIS — Z96653 Presence of artificial knee joint, bilateral: Secondary | ICD-10-CM | POA: Diagnosis not present

## 2017-02-08 DIAGNOSIS — M25562 Pain in left knee: Secondary | ICD-10-CM | POA: Diagnosis not present

## 2017-02-10 DIAGNOSIS — Z96653 Presence of artificial knee joint, bilateral: Secondary | ICD-10-CM | POA: Diagnosis not present

## 2017-02-10 DIAGNOSIS — M25562 Pain in left knee: Secondary | ICD-10-CM | POA: Diagnosis not present

## 2017-02-15 DIAGNOSIS — M25562 Pain in left knee: Secondary | ICD-10-CM | POA: Diagnosis not present

## 2017-02-15 DIAGNOSIS — Z96653 Presence of artificial knee joint, bilateral: Secondary | ICD-10-CM | POA: Diagnosis not present

## 2017-02-17 DIAGNOSIS — Z96653 Presence of artificial knee joint, bilateral: Secondary | ICD-10-CM | POA: Diagnosis not present

## 2017-02-17 DIAGNOSIS — M25562 Pain in left knee: Secondary | ICD-10-CM | POA: Diagnosis not present

## 2017-02-18 ENCOUNTER — Encounter (HOSPITAL_COMMUNITY): Payer: Self-pay | Admitting: Emergency Medicine

## 2017-02-18 ENCOUNTER — Emergency Department (HOSPITAL_COMMUNITY): Payer: Medicare Other

## 2017-02-18 ENCOUNTER — Emergency Department (HOSPITAL_COMMUNITY)
Admission: EM | Admit: 2017-02-18 | Discharge: 2017-02-19 | Disposition: A | Discharge status: Home / Self Care | Payer: Medicare Other | Attending: Emergency Medicine | Admitting: Emergency Medicine

## 2017-02-18 DIAGNOSIS — Z87891 Personal history of nicotine dependence: Secondary | ICD-10-CM | POA: Insufficient documentation

## 2017-02-18 DIAGNOSIS — E039 Hypothyroidism, unspecified: Secondary | ICD-10-CM | POA: Diagnosis not present

## 2017-02-18 DIAGNOSIS — S52021A Displaced fracture of olecranon process without intraarticular extension of right ulna, initial encounter for closed fracture: Secondary | ICD-10-CM | POA: Diagnosis not present

## 2017-02-18 DIAGNOSIS — Z79899 Other long term (current) drug therapy: Secondary | ICD-10-CM | POA: Diagnosis not present

## 2017-02-18 DIAGNOSIS — W2209XA Striking against other stationary object, initial encounter: Secondary | ICD-10-CM | POA: Diagnosis not present

## 2017-02-18 DIAGNOSIS — Y998 Other external cause status: Secondary | ICD-10-CM | POA: Insufficient documentation

## 2017-02-18 DIAGNOSIS — Z96653 Presence of artificial knee joint, bilateral: Secondary | ICD-10-CM | POA: Diagnosis not present

## 2017-02-18 DIAGNOSIS — Y92000 Kitchen of unspecified non-institutional (private) residence as  the place of occurrence of the external cause: Secondary | ICD-10-CM | POA: Diagnosis not present

## 2017-02-18 DIAGNOSIS — I10 Essential (primary) hypertension: Secondary | ICD-10-CM | POA: Insufficient documentation

## 2017-02-18 DIAGNOSIS — Z9049 Acquired absence of other specified parts of digestive tract: Secondary | ICD-10-CM | POA: Insufficient documentation

## 2017-02-18 DIAGNOSIS — S59901A Unspecified injury of right elbow, initial encounter: Secondary | ICD-10-CM | POA: Diagnosis present

## 2017-02-18 DIAGNOSIS — Y9389 Activity, other specified: Secondary | ICD-10-CM | POA: Insufficient documentation

## 2017-02-18 MED ORDER — HYDROCODONE-ACETAMINOPHEN 5-325 MG PO TABS
2.0000 | ORAL_TABLET | Freq: Once | ORAL | Status: AC
  Administered 2017-02-18: 2 via ORAL
  Filled 2017-02-18: qty 2

## 2017-02-18 MED ORDER — HYDROCODONE-ACETAMINOPHEN 5-325 MG PO TABS: 1.0000 | ORAL_TABLET | ORAL | 0 refills | Status: DC | PRN

## 2017-02-18 NOTE — ED Triage Notes (Signed)
Reports putting away dishes in dish washer when shelf came out causing her right elbow to hit the granite counter top.  States I think it is broken.

## 2017-02-18 NOTE — ED Provider Notes (Signed)
[facility] DEPT Provider Note   CSN: [id number] Arrival date & time: 02/18/17  2044     History   Chief Complaint Chief Complaint  Patient presents with  . Arm Injury    HPI Jennifer Goodman is a 82 y.o. female.  HPI Patient was in her kitchen and accidentally struck her right elbow on a granite countertop. She immediately had severe pain and noticed a swollen deformity. She did not fall or have other injury. Past Medical History:  Diagnosis Date  . Anemia   . Arthritis    Rhuematoid-takes Enbrel and Methorexate   . Back pain    states in Jan 2017 spinal was attempted 4 times and has had back pain since.  . Diverticulosis   . History of colon polyps    benign  . Hypertension    takes Avapro daily  . Hypothyroidism    takes Synthroid daily  . Joint pain   . Pneumonia    at age 23  . PONV (postoperative nausea and vomiting)   . Urinary frequency   . Urinary urgency    was taking Myrbetriq but stopped.Plans to start back    Patient Active Problem List   Diagnosis Date Noted  . Infection of urinary tract 05/29/2016  . Primary osteoarthritis of right knee 05/28/2016  . Primary osteoarthritis of left knee 06/20/2015  . Osteoporosis 06/20/2015  . Closed displaced fracture of medial condyle of left femur ([place]) 06/20/2015  . Abdominal pain, RLQ (right lower quadrant) 05/19/2012    Past Surgical History:  Procedure Laterality Date  . CHOLECYSTECTOMY    . COLONOSCOPY    . EYE SURGERY    . HAND TENDON SURGERY     Dr. Amedeo Plenty  . KNEE ARTHROSCOPY    . TOTAL KNEE ARTHROPLASTY Left 06/20/2015  . TOTAL KNEE ARTHROPLASTY Left 06/20/2015   Procedure: TOTAL KNEE ARTHROPLASTY;  Surgeon: Dorna Leitz, MD;  Location: [facility];  Service: Orthopedics;  Laterality: Left;  . TOTAL KNEE ARTHROPLASTY Right 05/28/2016   Procedure: TOTAL KNEE ARTHROPLASTY;  Surgeon: Dorna Leitz, MD;  Location: [facility];  Service: Orthopedics;  Laterality: Right;    OB History    No data available        Home Medications    Prior to Admission medications   Medication Sig Start Date End Date Taking? Authorizing Provider  Cholecalciferol (VITAMIN D3) 5000 UNITS TABS Take 1 tablet by mouth daily.    [provider]  cyanocobalamin (,VITAMIN B-12,) 1000 MCG/ML injection Inject 1,000 mcg into the muscle every 30 (thirty) days.    [provider]  etanercept (ENBREL) 50 MG/ML injection Inject 50 mg into the skin once a week. Thursdays    [provider]  fentaNYL (DURAGESIC - DOSED MCG/HR) 25 MCG/HR Place 1 patch onto the skin every 3 (three) days.    [provider]  folic acid (FOLVITE) 1 MG tablet Take 1 mg by mouth daily.    [provider]  HYDROcodone-acetaminophen (NORCO) 10-325 MG tablet Take 1-2 tablets by mouth every 6 (six) hours as needed for moderate pain or severe pain. 05/29/16   Gary Fleet, PA-C  HYDROcodone-acetaminophen (NORCO/VICODIN) 5-325 MG tablet Take 1-2 tablets by mouth every 4 (four) hours as needed for moderate pain or severe pain. 02/18/17   Charlesetta Shanks, MD  irbesartan (AVAPRO) 150 MG tablet Take 150 mg by mouth daily.     [provider]  levothyroxine (SYNTHROID, LEVOTHROID) 50 MCG tablet Take 50 mcg by mouth daily  before breakfast.    [provider]  methotrexate (RHEUMATREX) 2.5 MG tablet Take 20 mg by mouth once a week.    [provider]  pantoprazole (PROTONIX) 20 MG tablet Take 1 tablet (20 mg total) by mouth daily. 02/03/17 02/03/18  Kandra Nicolas, MD    Family History Family History  Problem Relation Age of Onset  . Cancer Father        colon ca/ lung ca  . Heart disease Father     Social History Social History  Substance Use Topics  . Smoking status: Former Smoker    Quit date: 06/14/1978  . Smokeless tobacco: Never Used     Comment: quit 32 yrs ago  . Alcohol use Yes     Comment: occ     Allergies   No known allergies   Review of Systems Review of  Systems Constitutional: No recent fever chills or general illness. Respiratory: No chest pain or shortness of breath  Physical Exam Updated Vital Signs BP (!) 147/71 (BP Location: Left Arm)   Pulse 79   Temp (!) 97.5 F (36.4 C) (Oral)   Ht 5\' 4"  (1.626 m)   Wt 67.1 kg (148 lb)   SpO2 100%   BMI 25.40 kg/m   Physical Exam  Constitutional: She is oriented to person, place, and time. She appears well-developed and well-nourished. No distress.  HENT:  Head: Normocephalic and atraumatic.  Eyes: EOM are normal.  Pulmonary/Chest: Effort normal.  Musculoskeletal:  Moderate to large swelling of right elbow. Normal radial pulse. Hand warm and dry with normal function.  Neurological: She is alert and oriented to person, place, and time. No cranial nerve deficit. She exhibits normal muscle tone. Coordination normal.  Skin: Skin is warm and dry.  Psychiatric: She has a normal mood and affect.     ED Treatments / Results  Labs (all labs ordered are listed, but only abnormal results are displayed) Labs Reviewed - No data to display  EKG  EKG Interpretation None       Radiology Dg Elbow Complete Right  Result Date: 02/18/2017 CLINICAL DATA:  82 year old female with injury to right elbow. Struck elbow on gray counter top. EXAM: RIGHT ELBOW - COMPLETE 3+ VIEW COMPARISON:  No priors. FINDINGS: Four views of the right elbow demonstrate a displaced transverse fracture of the olecranon process of the ulna, with approximately 1.8 cm of retraction of the proximal fracture fragment. Overlying soft tissues are swollen. Distal humerus and proximal radius appear intact. Degenerative changes of osteoarthritis are noted throughout the right elbow joint. IMPRESSION: 1. Acute transverse displaced fracture through the olecranon process of the right proximal ulna. Electronically Signed   By: Vinnie Langton M.D.   On: 02/18/2017 21:30    Procedures Procedures (including critical care  time)  Medications Ordered in ED Medications  HYDROcodone-acetaminophen (NORCO/VICODIN) 5-325 MG per tablet 2 tablet (2 tablets Oral Given 02/18/17 2311)     Initial Impression / Assessment and Plan / ED Course  I have reviewed the triage vital signs and the nursing notes.  Pertinent labs & imaging results that were available during my care of the patient were reviewed by me and considered in my medical decision making (see chart for details).     Final Clinical Impressions(s) / ED Diagnoses   Final diagnoses:  Closed fracture of right olecranon process, initial encounter  Patient was olecranon fracture as isolated injury. Long-arm splint placed by orthopedic tech. Patient will be given Vicodin to  take for pain. She is counseled on elevated icing. She will follow-up with her orthopedic doctor calling for appointment tomorrow.  New Prescriptions New Prescriptions   HYDROCODONE-ACETAMINOPHEN (NORCO/VICODIN) 5-325 MG TABLET    Take 1-2 tablets by mouth every 4 (four) hours as needed for moderate pain or severe pain.     Charlesetta Shanks, MD 02/19/17 0001

## 2017-02-21 DIAGNOSIS — M25521 Pain in right elbow: Secondary | ICD-10-CM | POA: Diagnosis not present

## 2017-02-21 DIAGNOSIS — S52011A Torus fracture of upper end of right ulna, initial encounter for closed fracture: Secondary | ICD-10-CM | POA: Diagnosis not present

## 2017-02-24 ENCOUNTER — Encounter (HOSPITAL_COMMUNITY): Payer: Self-pay | Admitting: Anesthesiology

## 2017-02-24 ENCOUNTER — Encounter (HOSPITAL_COMMUNITY): Payer: Self-pay | Admitting: *Deleted

## 2017-02-24 NOTE — H&P (Signed)
ULONDA KLOSOWSKI is an 82 y.o. female.   Chief Complaint: RIGHT ELBOW PROXIMAL ULNA FRACTURE  HPI: The patient is a 82 y/o right hand dominant female who sustained an injury to the right elbow on 02/18/17 due to a fall. She was seen in the emergency department to have radiographs done and was placed in a long-arm splint. She followed up in our office for further evaluation.  Discussed the reason and rationale for surgery and the use of a plate and screws. Discussed the surgical procedure, including the risks versus benefits, and the postoperative recovery. The patient is here today for surgery.  Past Medical History:  Diagnosis Date  . Anemia   . Arthritis    Rhuematoid-takes Enbrel and Methorexate   . Back pain    states in Jan 2017 spinal was attempted 4 times and has had back pain since.  . Cancer ([place])    skin cancer on head  . Diverticulosis   . History of colon polyps    benign  . Hypertension    takes Avapro daily  . Hypothyroidism    takes Synthroid daily  . Joint pain   . Pneumonia    at age 31  . PONV (postoperative nausea and vomiting)    the patch helps!  . Urinary frequency   . Urinary urgency    was taking Myrbetriq but stopped.Plans to start back    Past Surgical History:  Procedure Laterality Date  . CHOLECYSTECTOMY    . COLONOSCOPY    . EYE SURGERY Left    tear duct surgery  . HAND TENDON SURGERY     Dr. Amedeo Plenty  . KNEE ARTHROSCOPY    . TOTAL KNEE ARTHROPLASTY Left 06/20/2015  . TOTAL KNEE ARTHROPLASTY Left 06/20/2015   Procedure: TOTAL KNEE ARTHROPLASTY;  Surgeon: Dorna Leitz, MD;  Location: [facility];  Service: Orthopedics;  Laterality: Left;  . TOTAL KNEE ARTHROPLASTY Right 05/28/2016   Procedure: TOTAL KNEE ARTHROPLASTY;  Surgeon: Dorna Leitz, MD;  Location: [facility];  Service: Orthopedics;  Laterality: Right;    Family History  Problem Relation Age of Onset  . Cancer Father        colon ca/ lung ca  . Heart disease Father    Social History:  reports  that she quit smoking about 38 years ago. She has never used smokeless tobacco. She reports that she drinks alcohol. She reports that she does not use drugs.  Allergies:  Allergies  Allergen Reactions  . No Known Allergies   . Oxycodone Other (See Comments)    Makes hallucinates    No prescriptions prior to admission.    No results found for this or any previous visit (from the past 48 hour(s)). No results found.  ROS NO RECENT ILLNESSES OR HOSPITALIZATIONS  There were no vitals taken for this visit. Physical Exam  General Appearance:  Alert, cooperative, no distress, appears stated age  Head:  Normocephalic, without obvious abnormality, atraumatic  Eyes:  Pupils equal, conjunctiva/corneas clear,         Throat: Lips, mucosa, and tongue normal; teeth and gums normal  Neck: No visible masses     Lungs:   respirations unlabored  Chest Wall:  No tenderness or deformity  Heart:  Regular rate and rhythm,  Abdomen:   Soft, non-tender,         Extremities: RUE: LESS SWELLING IN REGION OF ELBOW, WIGGLES FINGERS FINGERS WARM WELL PERFUSED  Pulses: 2+ and symmetric  Skin: Skin color, texture,  turgor normal, no rashes or lesions     Neurologic: Normal    Assessment RIGHT ELBOW PROXIMAL ULNA FRACTURE  Plan RIGHT ELBOW PROXIMAL OLECRANON OPEN REDUCTION AND INTERNAL FIXATION  R/B/A DISCUSSED WITH PT IN OFFICE.  PT VOICED UNDERSTANDING OF PLAN CONSENT SIGNED DAY OF SURGERY PT SEEN AND EXAMINED PRIOR TO OPERATIVE PROCEDURE/DAY OF SURGERY SITE MARKED. QUESTIONS ANSWERED WILL GO HOME FOLLOWING SURGERY  WE ARE PLANNING SURGERY FOR YOUR UPPER EXTREMITY. THE RISKS AND BENEFITS OF SURGERY INCLUDE BUT NOT LIMITED TO BLEEDING INFECTION, DAMAGE TO NEARBY NERVES ARTERIES TENDONS, FAILURE OF SURGERY TO ACCOMPLISH ITS INTENDED GOALS, PERSISTENT SYMPTOMS AND NEED FOR FURTHER SURGICAL INTERVENTION. WITH THIS IN MIND WE WILL PROCEED. I HAVE DISCUSSED WITH THE PATIENT THE PRE AND  POSTOPERATIVE REGIMEN AND THE DOS AND DON'TS. PT VOICED UNDERSTANDING AND INFORMED CONSENT SIGNED.  Brynda Peon 02/24/2017, 12:31 PM

## 2017-02-24 NOTE — Progress Notes (Signed)
Spoke with pt for pre-op call. Pt denies cardiac history, chest pain or sob. Pt states she is not diabetic.  

## 2017-02-25 ENCOUNTER — Encounter (HOSPITAL_COMMUNITY): Payer: Self-pay | Admitting: *Deleted

## 2017-02-25 ENCOUNTER — Ambulatory Visit (HOSPITAL_COMMUNITY): Payer: Medicare Other | Admitting: Anesthesiology

## 2017-02-25 ENCOUNTER — Ambulatory Visit (HOSPITAL_COMMUNITY): Admission: RE | Admit: 2017-02-25 | Payer: Medicare Other | Source: Ambulatory Visit | Admitting: Orthopedic Surgery

## 2017-02-25 ENCOUNTER — Encounter (HOSPITAL_COMMUNITY)
Admission: RE | Disposition: A | Discharge status: Home / Self Care | Payer: Self-pay | Source: Ambulatory Visit | Attending: Orthopedic Surgery

## 2017-02-25 ENCOUNTER — Encounter (HOSPITAL_COMMUNITY): Admission: RE | Payer: Self-pay | Source: Ambulatory Visit

## 2017-02-25 ENCOUNTER — Ambulatory Visit (HOSPITAL_COMMUNITY)
Admission: RE | Admit: 2017-02-25 | Discharge: 2017-02-25 | Disposition: A | Discharge status: Home / Self Care | Payer: Medicare Other | Source: Ambulatory Visit | Attending: Orthopedic Surgery | Admitting: Orthopedic Surgery

## 2017-02-25 DIAGNOSIS — S52031A Displaced fracture of olecranon process with intraarticular extension of right ulna, initial encounter for closed fracture: Secondary | ICD-10-CM | POA: Diagnosis not present

## 2017-02-25 DIAGNOSIS — Z85828 Personal history of other malignant neoplasm of skin: Secondary | ICD-10-CM | POA: Insufficient documentation

## 2017-02-25 DIAGNOSIS — M549 Dorsalgia, unspecified: Secondary | ICD-10-CM | POA: Insufficient documentation

## 2017-02-25 DIAGNOSIS — E039 Hypothyroidism, unspecified: Secondary | ICD-10-CM | POA: Diagnosis not present

## 2017-02-25 DIAGNOSIS — S52091A Other fracture of upper end of right ulna, initial encounter for closed fracture: Secondary | ICD-10-CM | POA: Diagnosis not present

## 2017-02-25 DIAGNOSIS — Z79899 Other long term (current) drug therapy: Secondary | ICD-10-CM | POA: Diagnosis not present

## 2017-02-25 DIAGNOSIS — S52001A Unspecified fracture of upper end of right ulna, initial encounter for closed fracture: Secondary | ICD-10-CM

## 2017-02-25 DIAGNOSIS — S52021A Displaced fracture of olecranon process without intraarticular extension of right ulna, initial encounter for closed fracture: Secondary | ICD-10-CM | POA: Diagnosis not present

## 2017-02-25 DIAGNOSIS — M1990 Unspecified osteoarthritis, unspecified site: Secondary | ICD-10-CM | POA: Diagnosis not present

## 2017-02-25 DIAGNOSIS — I10 Essential (primary) hypertension: Secondary | ICD-10-CM | POA: Diagnosis not present

## 2017-02-25 DIAGNOSIS — Z87891 Personal history of nicotine dependence: Secondary | ICD-10-CM | POA: Diagnosis not present

## 2017-02-25 DIAGNOSIS — G8918 Other acute postprocedural pain: Secondary | ICD-10-CM | POA: Diagnosis not present

## 2017-02-25 HISTORY — DX: Malignant (primary) neoplasm, unspecified: C80.1

## 2017-02-25 HISTORY — PX: ORIF ELBOW FRACTURE: SHX5031

## 2017-02-25 LAB — BASIC METABOLIC PANEL
ANION GAP: 13 (ref 5–15)
BUN: 16 mg/dL (ref 6–20)
CALCIUM: 9.2 mg/dL (ref 8.9–10.3)
CO2: 17 mmol/L — ABNORMAL LOW (ref 22–32)
CREATININE: 1.09 mg/dL — AB (ref 0.44–1.00)
Chloride: 105 mmol/L (ref 101–111)
GFR, EST AFRICAN AMERICAN: 54 mL/min — AB (ref >60)
GFR, EST NON AFRICAN AMERICAN: 47 mL/min — AB (ref >60)
GLUCOSE: 87 mg/dL (ref 65–99)
Potassium: 5.2 mmol/L — ABNORMAL HIGH (ref 3.5–5.1)
Sodium: 135 mmol/L (ref 135–145)

## 2017-02-25 LAB — CBC
HCT: 38.1 % (ref 36.0–46.0)
HEMOGLOBIN: 12.4 g/dL (ref 12.0–15.0)
MCH: 32.9 pg (ref 26.0–34.0)
MCHC: 32.5 g/dL (ref 30.0–36.0)
MCV: 101.1 fL — ABNORMAL HIGH (ref 78.0–100.0)
Platelets: 225 10*3/uL (ref 150–400)
RBC: 3.77 MIL/uL — ABNORMAL LOW (ref 3.87–5.11)
RDW: 14.8 % (ref 11.5–15.5)
WBC: 4.7 10*3/uL (ref 4.0–10.5)

## 2017-02-25 SURGERY — OPEN REDUCTION INTERNAL FIXATION (ORIF) ELBOW/OLECRANON FRACTURE
Anesthesia: General | Site: Elbow | Laterality: Right

## 2017-02-25 SURGERY — OPEN REDUCTION INTERNAL FIXATION (ORIF) ELBOW/OLECRANON FRACTURE
Anesthesia: Choice | Laterality: Right

## 2017-02-25 MED ORDER — ONDANSETRON HCL 4 MG/2ML IJ SOLN
INTRAMUSCULAR | Status: AC
  Filled 2017-02-25: qty 2

## 2017-02-25 MED ORDER — PROMETHAZINE HCL 25 MG/ML IJ SOLN: 6.2500 mg | INTRAMUSCULAR | Status: DC | PRN

## 2017-02-25 MED ORDER — ROCURONIUM BROMIDE 10 MG/ML (PF) SYRINGE
PREFILLED_SYRINGE | INTRAVENOUS | Status: AC
  Filled 2017-02-25: qty 5

## 2017-02-25 MED ORDER — CHLORHEXIDINE GLUCONATE 4 % EX LIQD: 60.0000 mL | Freq: Once | CUTANEOUS | Status: DC

## 2017-02-25 MED ORDER — LIDOCAINE 2% (20 MG/ML) 5 ML SYRINGE
INTRAMUSCULAR | Status: AC
  Filled 2017-02-25: qty 5

## 2017-02-25 MED ORDER — CEFAZOLIN SODIUM-DEXTROSE 2-4 GM/100ML-% IV SOLN
2.0000 g | INTRAVENOUS | Status: AC
  Administered 2017-02-25: 2 g via INTRAVENOUS

## 2017-02-25 MED ORDER — PROPOFOL 10 MG/ML IV BOLUS
INTRAVENOUS | Status: AC
  Filled 2017-02-25: qty 20

## 2017-02-25 MED ORDER — LACTATED RINGERS IV SOLN
INTRAVENOUS | Status: DC
  Administered 2017-02-25 (×2): via INTRAVENOUS

## 2017-02-25 MED ORDER — LIDOCAINE 2% (20 MG/ML) 5 ML SYRINGE
INTRAMUSCULAR | Status: DC | PRN
  Administered 2017-02-25: 40 mg via INTRAVENOUS

## 2017-02-25 MED ORDER — CEFAZOLIN SODIUM-DEXTROSE 2-4 GM/100ML-% IV SOLN
INTRAVENOUS | Status: AC
  Filled 2017-02-25: qty 100

## 2017-02-25 MED ORDER — SCOPOLAMINE 1 MG/3DAYS TD PT72
MEDICATED_PATCH | TRANSDERMAL | Status: AC
  Filled 2017-02-25: qty 1

## 2017-02-25 MED ORDER — MIDAZOLAM HCL 2 MG/2ML IJ SOLN
2.0000 mg | Freq: Once | INTRAMUSCULAR | Status: AC
  Administered 2017-02-25: 2 mg via INTRAVENOUS

## 2017-02-25 MED ORDER — PHENYLEPHRINE 40 MCG/ML (10ML) SYRINGE FOR IV PUSH (FOR BLOOD PRESSURE SUPPORT)
PREFILLED_SYRINGE | INTRAVENOUS | Status: DC | PRN
  Administered 2017-02-25 (×2): 80 ug via INTRAVENOUS
  Administered 2017-02-25: 40 ug via INTRAVENOUS
  Administered 2017-02-25: 80 ug via INTRAVENOUS

## 2017-02-25 MED ORDER — SUGAMMADEX SODIUM 200 MG/2ML IV SOLN
INTRAVENOUS | Status: DC | PRN
  Administered 2017-02-25: 134.2 mg via INTRAVENOUS

## 2017-02-25 MED ORDER — ROPIVACAINE HCL 5 MG/ML IJ SOLN
INTRAMUSCULAR | Status: DC | PRN
  Administered 2017-02-25: 30 mL via PERINEURAL

## 2017-02-25 MED ORDER — SCOPOLAMINE 1 MG/3DAYS TD PT72
1.0000 | MEDICATED_PATCH | TRANSDERMAL | Status: DC
  Administered 2017-02-25: 1.5 mg via TRANSDERMAL

## 2017-02-25 MED ORDER — ROCURONIUM BROMIDE 10 MG/ML (PF) SYRINGE
PREFILLED_SYRINGE | INTRAVENOUS | Status: DC | PRN
  Administered 2017-02-25: 50 mg via INTRAVENOUS

## 2017-02-25 MED ORDER — DEXAMETHASONE SODIUM PHOSPHATE 10 MG/ML IJ SOLN
INTRAMUSCULAR | Status: AC
  Filled 2017-02-25: qty 1

## 2017-02-25 MED ORDER — FENTANYL CITRATE (PF) 100 MCG/2ML IJ SOLN
50.0000 ug | Freq: Once | INTRAMUSCULAR | Status: AC
  Administered 2017-02-25: 50 ug via INTRAVENOUS

## 2017-02-25 MED ORDER — DEXAMETHASONE SODIUM PHOSPHATE 10 MG/ML IJ SOLN
INTRAMUSCULAR | Status: DC | PRN
  Administered 2017-02-25: 10 mg via INTRAVENOUS

## 2017-02-25 MED ORDER — MIDAZOLAM HCL 2 MG/2ML IJ SOLN
INTRAMUSCULAR | Status: AC
  Filled 2017-02-25: qty 2

## 2017-02-25 MED ORDER — MIDAZOLAM HCL 5 MG/5ML IJ SOLN
INTRAMUSCULAR | Status: DC | PRN
  Administered 2017-02-25: 1 mg via INTRAVENOUS

## 2017-02-25 MED ORDER — ONDANSETRON HCL 4 MG/2ML IJ SOLN
INTRAMUSCULAR | Status: DC | PRN
  Administered 2017-02-25: 4 mg via INTRAVENOUS

## 2017-02-25 MED ORDER — PHENYLEPHRINE 40 MCG/ML (10ML) SYRINGE FOR IV PUSH (FOR BLOOD PRESSURE SUPPORT)
PREFILLED_SYRINGE | INTRAVENOUS | Status: AC
  Filled 2017-02-25: qty 10

## 2017-02-25 MED ORDER — FENTANYL CITRATE (PF) 100 MCG/2ML IJ SOLN
INTRAMUSCULAR | Status: AC
  Administered 2017-02-25: 50 ug via INTRAVENOUS
  Filled 2017-02-25: qty 2

## 2017-02-25 MED ORDER — 0.9 % SODIUM CHLORIDE (POUR BTL) OPTIME
TOPICAL | Status: DC | PRN
  Administered 2017-02-25: 1000 mL

## 2017-02-25 MED ORDER — SUGAMMADEX SODIUM 200 MG/2ML IV SOLN
INTRAVENOUS | Status: AC
  Filled 2017-02-25: qty 2

## 2017-02-25 MED ORDER — MIDAZOLAM HCL 2 MG/2ML IJ SOLN
INTRAMUSCULAR | Status: AC
  Administered 2017-02-25: 2 mg via INTRAVENOUS
  Filled 2017-02-25: qty 2

## 2017-02-25 MED ORDER — FENTANYL CITRATE (PF) 250 MCG/5ML IJ SOLN
INTRAMUSCULAR | Status: AC
  Filled 2017-02-25: qty 5

## 2017-02-25 MED ORDER — PROPOFOL 10 MG/ML IV BOLUS
INTRAVENOUS | Status: DC | PRN
  Administered 2017-02-25 (×2): 100 mg via INTRAVENOUS

## 2017-02-25 SURGICAL SUPPLY — 66 items
BANDAGE ACE 3X5.8 VEL STRL LF (GAUZE/BANDAGES/DRESSINGS) IMPLANT
BANDAGE ACE 4X5 VEL STRL LF (GAUZE/BANDAGES/DRESSINGS) IMPLANT
BIT DRILL TWIST CANN 2.30 (BIT) ×3 IMPLANT
BNDG COHESIVE 4X5 TAN STRL (GAUZE/BANDAGES/DRESSINGS) IMPLANT
BNDG ESMARK 4X9 LF (GAUZE/BANDAGES/DRESSINGS) ×3 IMPLANT
BNDG GAUZE ELAST 4 BULKY (GAUZE/BANDAGES/DRESSINGS) IMPLANT
BNDG STRETCH 4X75 NS LF (GAUZE/BANDAGES/DRESSINGS) ×3 IMPLANT
CORDS BIPOLAR (ELECTRODE) ×3 IMPLANT
COVER MAYO STAND STRL (DRAPES) ×3 IMPLANT
COVER SURGICAL LIGHT HANDLE (MISCELLANEOUS) ×3 IMPLANT
CUFF TOURNIQUET SINGLE 18IN (TOURNIQUET CUFF) ×3 IMPLANT
CUFF TOURNIQUET SINGLE 24IN (TOURNIQUET CUFF) IMPLANT
DRAPE INCISE IOBAN 66X45 STRL (DRAPES) ×3 IMPLANT
DRAPE OEC MINIVIEW 54X84 (DRAPES) ×3 IMPLANT
DRAPE ORTHO SPLIT 77X108 STRL (DRAPES) ×4
DRAPE SURG ORHT 6 SPLT 77X108 (DRAPES) ×2 IMPLANT
DRAPE U-SHAPE 47X51 STRL (DRAPES) ×3 IMPLANT
DRSG ADAPTIC 3X8 NADH LF (GAUZE/BANDAGES/DRESSINGS) IMPLANT
GAUZE SPONGE 4X4 12PLY STRL (GAUZE/BANDAGES/DRESSINGS) IMPLANT
GAUZE SPONGE 4X4 12PLY STRL LF (GAUZE/BANDAGES/DRESSINGS) ×3 IMPLANT
GAUZE XEROFORM 1X8 LF (GAUZE/BANDAGES/DRESSINGS) ×3 IMPLANT
GAUZE XEROFORM 5X9 LF (GAUZE/BANDAGES/DRESSINGS) IMPLANT
GLOVE BIOGEL PI IND STRL 8.5 (GLOVE) ×1 IMPLANT
GLOVE BIOGEL PI INDICATOR 8.5 (GLOVE) ×2
GLOVE SURG ORTHO 8.0 STRL STRW (GLOVE) ×3 IMPLANT
GOWN STRL REUS W/ TWL LRG LVL3 (GOWN DISPOSABLE) ×2 IMPLANT
GOWN STRL REUS W/ TWL XL LVL3 (GOWN DISPOSABLE) ×1 IMPLANT
GOWN STRL REUS W/TWL LRG LVL3 (GOWN DISPOSABLE) ×4
GOWN STRL REUS W/TWL XL LVL3 (GOWN DISPOSABLE) ×2
K-WIRE 1.6X100 (WIRE) ×4
K-WIRE 1.6X65 (WIRE) ×6
K-WIRE FX100X1.6XNS KRSH (WIRE) ×2
KIT BASIN OR (CUSTOM PROCEDURE TRAY) ×3 IMPLANT
KIT ROOM TURNOVER OR (KITS) ×3 IMPLANT
KWIRE 1.6X65 (WIRE) ×2 IMPLANT
KWIRE FX100X1.6XNS KRSH (WIRE) ×2 IMPLANT
LOOP VESSEL MAXI BLUE (MISCELLANEOUS) IMPLANT
MANIFOLD NEPTUNE II (INSTRUMENTS) IMPLANT
NEEDLE HYPO 25GX1X1/2 BEV (NEEDLE) IMPLANT
NS IRRIG 1000ML POUR BTL (IV SOLUTION) ×3 IMPLANT
PACK ORTHO EXTREMITY (CUSTOM PROCEDURE TRAY) ×3 IMPLANT
PAD ARMBOARD 7.5X6 YLW CONV (MISCELLANEOUS) ×6 IMPLANT
PAD CAST 4YDX4 CTTN HI CHSV (CAST SUPPLIES) IMPLANT
PADDING CAST COTTON 4X4 STRL (CAST SUPPLIES)
PLATE HOOK OLECRANON 4H NEUTR (Plate) ×3 IMPLANT
SCREW CORTICAL 3.2X20MM (Screw) ×3 IMPLANT
SCREW LOCKING 3.2X22MM (Screw) ×3 IMPLANT
SCREW NON-LOCK CORTICAL 3.2X26 (Screw) ×3 IMPLANT
SOAP 2 % CHG 4 OZ (WOUND CARE) ×3 IMPLANT
SUCTION FRAZIER HANDLE 10FR (MISCELLANEOUS)
SUCTION TUBE FRAZIER 10FR DISP (MISCELLANEOUS) IMPLANT
SUT MNCRL AB 4-0 PS2 18 (SUTURE) ×6 IMPLANT
SUT PROLENE 3 0 PS 2 (SUTURE) ×6 IMPLANT
SUT PROLENE 4 0 PS 2 18 (SUTURE) IMPLANT
SUT VIC AB 2-0 CT1 27 (SUTURE)
SUT VIC AB 2-0 CT1 TAPERPNT 27 (SUTURE) IMPLANT
SUT VIC AB 3-0 SH 27 (SUTURE) ×2
SUT VIC AB 3-0 SH 27X BRD (SUTURE) ×1 IMPLANT
SUT VICRYL 4-0 PS2 18IN ABS (SUTURE) IMPLANT
SYR CONTROL 10ML LL (SYRINGE) IMPLANT
TOWEL OR 17X24 6PK STRL BLUE (TOWEL DISPOSABLE) ×3 IMPLANT
TOWEL OR 17X26 10 PK STRL BLUE (TOWEL DISPOSABLE) ×3 IMPLANT
TUBE CONNECTING 12'X1/4 (SUCTIONS) ×1
TUBE CONNECTING 12X1/4 (SUCTIONS) ×2 IMPLANT
UNDERPAD 30X30 (UNDERPADS AND DIAPERS) ×3 IMPLANT
WATER STERILE IRR 1000ML POUR (IV SOLUTION) ×3 IMPLANT

## 2017-02-25 NOTE — Anesthesia Procedure Notes (Signed)
Procedure Name: Intubation Date/Time: 02/25/2017 12:58 PM Performed by: Freddie Breech Pre-anesthesia Checklist: Patient identified, Emergency Drugs available, Suction available and Patient being monitored Patient Re-evaluated:Patient Re-evaluated prior to induction Oxygen Delivery Method: Circle System Utilized Preoxygenation: Pre-oxygenation with 100% oxygen Induction Type: IV induction Ventilation: Mask ventilation without difficulty Laryngoscope Size: Mac and 3 Grade View: Grade I Tube type: Oral Tube size: 7.0 mm Number of attempts: 1 Airway Equipment and Method: Stylet and Oral airway Placement Confirmation: ETT inserted through vocal cords under direct vision,  positive ETCO2 and breath sounds checked- equal and bilateral Secured at: 21 cm Tube secured with: Tape Dental Injury: Teeth and Oropharynx as per pre-operative assessment

## 2017-02-25 NOTE — Transfer of Care (Signed)
Immediate Anesthesia Transfer of Care Note  Patient: Jennifer Goodman  Procedure(s) Performed: Procedure(s): OPEN REDUCTION INTERNAL FIXATION (ORIF) RIGHT PROXIMAL OLECRANON FRACTURE (Right)  Patient Location: PACU  Anesthesia Type:GA combined with regional for post-op pain  Level of Consciousness: drowsy and patient cooperative  Airway & Oxygen Therapy: Patient Spontanous Breathing and Patient connected to face mask oxygen  Post-op Assessment: Report given to RN and Post -op Vital signs reviewed and stable  Post vital signs: Reviewed and stable  Last Vitals:  Vitals:   02/25/17 1145 02/25/17 1407  BP: (!) 148/60   Pulse: 69   Resp: 17   Temp:  (!) 36.3 C  SpO2: 99%     Last Pain:  Vitals:   02/25/17 1407  TempSrc: Oral      Patients Stated Pain Goal: 2 (59/74/16 3845)  Complications: No apparent anesthesia complications

## 2017-02-25 NOTE — Op Note (Signed)
PREOPERATIVE DIAGNOSIS:Right proximal olecranon fracture  POSTOPERATIVE DIAGNOSIS:same  ATTENDING SURGEON:Dr. Gavin Pound who was scrubbed and present for the entire procedure  ASSISTANT SURGEON:Samantha Tenny Craw who was scrubbed and necessary for the entire procedure to help with internal fixation closure and splinting  ANESTHESIA:Gen. Via endotracheal tube  OPERATIVE PROCEDURE:#1: Open treatment right proximal olecranon fracture requiring internal fixation #2:Radiographs 3 views right elbow  IMPLANTS:tri-med proximal olecranon hookplate  RADIOGRAPHIC INTERPRETATION:AP lateral oblique views of the elbow do show the dorsal plate fixation in place in good position  SURGICAL INDICATIONS:Jennifer Goodman is a right-hand-dominant female who sustained a closed injury to her proximal olecranon. Patient was seen and evaluated in the office and recommended to undergo the above procedure. Risks benefits and alternatives were discussed in detail with the patient in a signed informed consent was obtained. Risks include but not limited to bleeding infection damage to nearby nerves arteries or tendons loss of motion of wrists and digits incomplete relief of symptoms and need for further surgical intervention  SURGICAL TECHNIQUE:The patient is properly identified in the preoperative holding area and a mark with a permanent marker made on the right elbow to indicate correct operative site. Patient brought back to the operating placed supine on anesthesia and table where general anesthesia was administered. Patient tolerated this well. A well-padded tourniquet was then placed on the right brachium and sealed with the appropriate drape.right upper extremities and prepped and draped in normal sterile fashion. Timeout was called cracks I was notified the procedure was then begun. Attention was then turned to the right elbow. A posterior incision was then made in the olecranon tip curving over the radial margin.  Tourniquet had been insufflated. Dissection was then carried down through the skin subtendinous tissue. Large skin flaps were then raised. The fracture site was then exposed. Fracture hematoma was then evacuated.The proximal olecranon and was then exposed. Following this the reduction was then carried out and held in place with a K wire. Following this the alignment guide for the hook plate was then placed. It was pinned distally. Position was then confirmed. Once this is carried out it was then drilled proximally the plate was then applied and tamped down to the bone nicely. Following this the compression screw was then placed distal to the fracture. The compression guide was then used the plate was loaded in compression nicely. The screw was tightened down. 2 more bicortical screws were then placed distal to the fracture. The wound was then thoroughly irrigated. Final radiographs were then obtained.Fascia was then closed over the distal edge of the plate with 3-0 Vicryl. Subtendinous tissues closed with 4-0 Monocryl and skin closed with 3-0 Prolene. Xeroform dressing sterile compressive bandage then applied. The patient is in place and back in her long-arm splint explained taken recovery room in good condition.  POSTOPERATIVE PLAN:patient be discharged to home. Patient be seen back now office in approximately 14 days for wound check suture removal x-rays back in a long-arm splint and again a therapy regimen. Radiographs at each visit.

## 2017-02-25 NOTE — Discharge Instructions (Signed)
KEEP BANDAGE CLEAN AND DRY CALL OFFICE FOR F/U APPT [phone] IN 14 DAYS DR Caralyn Guile CELL [phone] KEEP HAND ELEVATED ABOVE HEART OK TO APPLY ICE TO OPERATIVE AREA CONTACT OFFICE IF ANY WORSENING PAIN OR CONCERNS.

## 2017-02-25 NOTE — Anesthesia Preprocedure Evaluation (Addendum)
Anesthesia Evaluation  Patient identified by MRN, date of birth, ID band Patient awake    Reviewed: Allergy & Precautions, NPO status , Patient's Chart, lab work & pertinent test results  History of Anesthesia Complications (+) PONV and history of anesthetic complications  Airway Mallampati: II  TM Distance: >3 FB Neck ROM: Full    Dental  (+) Teeth Intact, Dental Advisory Given   Pulmonary pneumonia, resolved, former smoker,    breath sounds clear to auscultation       Cardiovascular hypertension, Pt. on medications  Rhythm:Regular Rate:Normal     Neuro/Psych negative neurological ROS  negative psych ROS   GI/Hepatic negative GI ROS, Neg liver ROS,   Endo/Other  Hypothyroidism   Renal/GU negative Renal ROS  negative genitourinary   Musculoskeletal  (+) Arthritis , Rheumatoid disorders,  Fx Right Olecranon   Abdominal Normal abdominal exam  (+)   Peds  Hematology  (+) anemia ,   Anesthesia Other Findings   Reproductive/Obstetrics                            Anesthesia Physical  Anesthesia Plan  ASA: III  Anesthesia Plan: General   Post-op Pain Management:  Regional for Post-op pain   Induction: Intravenous  PONV Risk Score and Plan: 4 or greater and Ondansetron, Dexamethasone, Midazolam, Scopolamine patch - Pre-op and Propofol infusion  Airway Management Planned: Oral ETT  Additional Equipment:   Intra-op Plan:   Post-operative Plan: Extubation in OR  Informed Consent: I have reviewed the patients History and Physical, chart, labs and discussed the procedure including the risks, benefits and alternatives for the proposed anesthesia with the patient or authorized representative who has indicated his/her understanding and acceptance.   Dental advisory given  Plan Discussed with: CRNA and Anesthesiologist  Anesthesia Plan Comments:         Anesthesia Quick  Evaluation

## 2017-02-25 NOTE — Anesthesia Procedure Notes (Addendum)
Anesthesia Regional Block: Supraclavicular block   Pre-Anesthetic Checklist: ,, timeout performed, Correct Patient, Correct Site, Correct Laterality, Correct Procedure, Correct Position, site marked, Risks and benefits discussed,  Surgical consent,  Pre-op evaluation,  At surgeon's request and post-op pain management  Laterality: Right  Prep: chloraprep       Needles:  Injection technique: Single-shot  Needle Type: Echogenic Stimulator Needle     Needle Length: 9cm  Needle Gauge: 21   Needle insertion depth: 5 cm   Additional Needles:   Procedures:,,,, ultrasound used (permanent image in chart),,,,  Narrative:  Start time: 02/25/2017 11:31 AM End time: 02/25/2017 11:36 AM Injection made incrementally with aspirations every 5 mL.  Performed by: Personally  Anesthesiologist: Josephine Igo  Additional Notes: Timeout performed. Patient sedated. Relevant anatomy ID'd using [place]. Incremental 2-47ml injection of LA with frequent aspiration. Patient tolerated procedure well.

## 2017-02-26 NOTE — Anesthesia Postprocedure Evaluation (Signed)
Anesthesia Post Note  Patient: Jennifer Goodman  Procedure(s) Performed: Procedure(s) (LRB): OPEN REDUCTION INTERNAL FIXATION (ORIF) RIGHT PROXIMAL OLECRANON FRACTURE (Right)     Anesthesia Post Evaluation  Last Vitals:  Vitals:   02/25/17 1430 02/25/17 1445  BP: (!) 141/79 (!) 149/76  Pulse:    Resp:    Temp:  36.5 C  SpO2:      Last Pain:  Vitals:   02/25/17 1407  TempSrc: Oral  PainSc:                  Lynda Rainwater

## 2017-03-03 ENCOUNTER — Encounter (HOSPITAL_COMMUNITY): Payer: Self-pay | Admitting: Orthopedic Surgery

## 2017-03-07 ENCOUNTER — Encounter (HOSPITAL_COMMUNITY): Payer: Self-pay | Admitting: Orthopedic Surgery

## 2017-03-07 NOTE — OR Nursing (Signed)
LATE ENTRY: Implant log incorrect.  Removed incorrect screws and replaced with correct implants for procedure.  Verified with the request to purchase and surgeon's operative note.  Acquanetta Sit, RN circulator signed request to purchase verifying implants.

## 2017-03-11 ENCOUNTER — Encounter (HOSPITAL_COMMUNITY): Payer: Self-pay | Admitting: Orthopedic Surgery

## 2017-03-11 DIAGNOSIS — M25621 Stiffness of right elbow, not elsewhere classified: Secondary | ICD-10-CM | POA: Diagnosis not present

## 2017-03-11 DIAGNOSIS — S52011D Torus fracture of upper end of right ulna, subsequent encounter for fracture with routine healing: Secondary | ICD-10-CM | POA: Diagnosis not present

## 2017-03-15 DIAGNOSIS — M25521 Pain in right elbow: Secondary | ICD-10-CM | POA: Diagnosis not present

## 2017-03-29 DIAGNOSIS — M150 Primary generalized (osteo)arthritis: Secondary | ICD-10-CM | POA: Diagnosis not present

## 2017-03-29 DIAGNOSIS — M0589 Other rheumatoid arthritis with rheumatoid factor of multiple sites: Secondary | ICD-10-CM | POA: Diagnosis not present

## 2017-03-29 DIAGNOSIS — G894 Chronic pain syndrome: Secondary | ICD-10-CM | POA: Diagnosis not present

## 2017-03-29 DIAGNOSIS — Z79899 Other long term (current) drug therapy: Secondary | ICD-10-CM | POA: Diagnosis not present

## 2017-03-29 DIAGNOSIS — M25621 Stiffness of right elbow, not elsewhere classified: Secondary | ICD-10-CM | POA: Diagnosis not present

## 2017-03-29 DIAGNOSIS — M25561 Pain in right knee: Secondary | ICD-10-CM | POA: Diagnosis not present

## 2017-04-01 DIAGNOSIS — S52011D Torus fracture of upper end of right ulna, subsequent encounter for fracture with routine healing: Secondary | ICD-10-CM | POA: Diagnosis not present

## 2017-04-05 DIAGNOSIS — M25621 Stiffness of right elbow, not elsewhere classified: Secondary | ICD-10-CM | POA: Diagnosis not present

## 2017-04-11 ENCOUNTER — Encounter: Payer: Self-pay | Admitting: Family Medicine

## 2017-04-11 DIAGNOSIS — Z23 Encounter for immunization: Secondary | ICD-10-CM | POA: Diagnosis not present

## 2017-04-11 DIAGNOSIS — E559 Vitamin D deficiency, unspecified: Secondary | ICD-10-CM | POA: Diagnosis not present

## 2017-04-11 DIAGNOSIS — Z0000 Encounter for general adult medical examination without abnormal findings: Secondary | ICD-10-CM | POA: Diagnosis not present

## 2017-04-11 DIAGNOSIS — E7800 Pure hypercholesterolemia, unspecified: Secondary | ICD-10-CM | POA: Diagnosis not present

## 2017-04-12 DIAGNOSIS — M722 Plantar fascial fibromatosis: Secondary | ICD-10-CM | POA: Diagnosis not present

## 2017-04-12 DIAGNOSIS — M7662 Achilles tendinitis, left leg: Secondary | ICD-10-CM | POA: Diagnosis not present

## 2017-04-12 DIAGNOSIS — M79671 Pain in right foot: Secondary | ICD-10-CM | POA: Diagnosis not present

## 2017-04-12 DIAGNOSIS — M79672 Pain in left foot: Secondary | ICD-10-CM | POA: Diagnosis not present

## 2017-04-12 DIAGNOSIS — Z96653 Presence of artificial knee joint, bilateral: Secondary | ICD-10-CM | POA: Diagnosis not present

## 2017-04-12 DIAGNOSIS — M7661 Achilles tendinitis, right leg: Secondary | ICD-10-CM | POA: Diagnosis not present

## 2017-04-12 DIAGNOSIS — M25562 Pain in left knee: Secondary | ICD-10-CM | POA: Diagnosis not present

## 2017-04-14 DIAGNOSIS — N390 Urinary tract infection, site not specified: Secondary | ICD-10-CM | POA: Diagnosis not present

## 2017-04-14 DIAGNOSIS — R319 Hematuria, unspecified: Secondary | ICD-10-CM | POA: Diagnosis not present

## 2017-04-14 DIAGNOSIS — I10 Essential (primary) hypertension: Secondary | ICD-10-CM | POA: Diagnosis not present

## 2017-04-14 DIAGNOSIS — Z0000 Encounter for general adult medical examination without abnormal findings: Secondary | ICD-10-CM | POA: Diagnosis not present

## 2017-04-14 DIAGNOSIS — E039 Hypothyroidism, unspecified: Secondary | ICD-10-CM | POA: Diagnosis not present

## 2017-04-18 DIAGNOSIS — Z96653 Presence of artificial knee joint, bilateral: Secondary | ICD-10-CM | POA: Diagnosis not present

## 2017-04-18 DIAGNOSIS — M25562 Pain in left knee: Secondary | ICD-10-CM | POA: Diagnosis not present

## 2017-04-21 DIAGNOSIS — M25562 Pain in left knee: Secondary | ICD-10-CM | POA: Diagnosis not present

## 2017-04-21 DIAGNOSIS — Z96653 Presence of artificial knee joint, bilateral: Secondary | ICD-10-CM | POA: Diagnosis not present

## 2017-04-25 DIAGNOSIS — Z96653 Presence of artificial knee joint, bilateral: Secondary | ICD-10-CM | POA: Diagnosis not present

## 2017-04-25 DIAGNOSIS — M25562 Pain in left knee: Secondary | ICD-10-CM | POA: Diagnosis not present

## 2017-04-28 DIAGNOSIS — M25562 Pain in left knee: Secondary | ICD-10-CM | POA: Diagnosis not present

## 2017-04-28 DIAGNOSIS — Z96653 Presence of artificial knee joint, bilateral: Secondary | ICD-10-CM | POA: Diagnosis not present

## 2017-04-29 DIAGNOSIS — S52011D Torus fracture of upper end of right ulna, subsequent encounter for fracture with routine healing: Secondary | ICD-10-CM | POA: Diagnosis not present

## 2017-05-02 DIAGNOSIS — M25562 Pain in left knee: Secondary | ICD-10-CM | POA: Diagnosis not present

## 2017-05-02 DIAGNOSIS — Z96653 Presence of artificial knee joint, bilateral: Secondary | ICD-10-CM | POA: Diagnosis not present

## 2017-05-24 DIAGNOSIS — M722 Plantar fascial fibromatosis: Secondary | ICD-10-CM | POA: Diagnosis not present

## 2017-06-10 DIAGNOSIS — S52011D Torus fracture of upper end of right ulna, subsequent encounter for fracture with routine healing: Secondary | ICD-10-CM | POA: Diagnosis not present

## 2017-06-29 DIAGNOSIS — Z79899 Other long term (current) drug therapy: Secondary | ICD-10-CM | POA: Diagnosis not present

## 2017-06-29 DIAGNOSIS — M150 Primary generalized (osteo)arthritis: Secondary | ICD-10-CM | POA: Diagnosis not present

## 2017-06-29 DIAGNOSIS — G894 Chronic pain syndrome: Secondary | ICD-10-CM | POA: Diagnosis not present

## 2017-06-29 DIAGNOSIS — M0589 Other rheumatoid arthritis with rheumatoid factor of multiple sites: Secondary | ICD-10-CM | POA: Diagnosis not present

## 2017-06-29 DIAGNOSIS — M25561 Pain in right knee: Secondary | ICD-10-CM | POA: Diagnosis not present

## 2017-07-12 DIAGNOSIS — Z85828 Personal history of other malignant neoplasm of skin: Secondary | ICD-10-CM | POA: Diagnosis not present

## 2017-07-12 DIAGNOSIS — L570 Actinic keratosis: Secondary | ICD-10-CM | POA: Diagnosis not present

## 2017-07-12 DIAGNOSIS — D225 Melanocytic nevi of trunk: Secondary | ICD-10-CM | POA: Diagnosis not present

## 2017-07-12 DIAGNOSIS — L821 Other seborrheic keratosis: Secondary | ICD-10-CM | POA: Diagnosis not present

## 2017-07-26 DIAGNOSIS — M25561 Pain in right knee: Secondary | ICD-10-CM | POA: Diagnosis not present

## 2017-07-26 DIAGNOSIS — M75122 Complete rotator cuff tear or rupture of left shoulder, not specified as traumatic: Secondary | ICD-10-CM | POA: Diagnosis not present

## 2017-07-26 DIAGNOSIS — M25562 Pain in left knee: Secondary | ICD-10-CM | POA: Diagnosis not present

## 2017-08-23 DIAGNOSIS — M5441 Lumbago with sciatica, right side: Secondary | ICD-10-CM | POA: Diagnosis not present

## 2017-08-23 DIAGNOSIS — M5442 Lumbago with sciatica, left side: Secondary | ICD-10-CM | POA: Diagnosis not present

## 2017-08-23 DIAGNOSIS — M25561 Pain in right knee: Secondary | ICD-10-CM | POA: Diagnosis not present

## 2017-08-23 DIAGNOSIS — M545 Low back pain: Secondary | ICD-10-CM | POA: Diagnosis not present

## 2017-08-24 ENCOUNTER — Ambulatory Visit (INDEPENDENT_AMBULATORY_CARE_PROVIDER_SITE_OTHER): Payer: Medicare Other | Admitting: Family Medicine

## 2017-08-24 ENCOUNTER — Other Ambulatory Visit: Payer: Self-pay | Admitting: Family Medicine

## 2017-08-24 ENCOUNTER — Encounter: Payer: Self-pay | Admitting: Family Medicine

## 2017-08-24 DIAGNOSIS — N183 Chronic kidney disease, stage 3 unspecified: Secondary | ICD-10-CM

## 2017-08-24 DIAGNOSIS — I10 Essential (primary) hypertension: Secondary | ICD-10-CM

## 2017-08-24 DIAGNOSIS — E039 Hypothyroidism, unspecified: Secondary | ICD-10-CM | POA: Diagnosis not present

## 2017-08-24 LAB — MICROALBUMIN / CREATININE URINE RATIO
Creatinine,U: 183.5 mg/dL
Microalb Creat Ratio: 0.7 mg/g (ref 0.0–30.0)
Microalb, Ur: 1.4 mg/dL (ref 0.0–1.9)

## 2017-08-24 LAB — BASIC METABOLIC PANEL
BUN: 19 mg/dL (ref 6–23)
CALCIUM: 10.3 mg/dL (ref 8.4–10.5)
CHLORIDE: 100 meq/L (ref 96–112)
CO2: 27 meq/L (ref 19–32)
CREATININE: 0.95 mg/dL (ref 0.40–1.20)
GFR: 60.09 mL/min (ref >60.00)
Glucose, Bld: 124 mg/dL — ABNORMAL HIGH (ref 70–99)
Potassium: 4.9 mEq/L (ref 3.5–5.1)
Sodium: 135 mEq/L (ref 135–145)

## 2017-08-24 LAB — TSH: TSH: 0.76 u[IU]/mL (ref 0.35–4.50)

## 2017-08-24 MED ORDER — IRBESARTAN 150 MG PO TABS: 150.0000 mg | ORAL_TABLET | Freq: Every day | ORAL | 2 refills | Status: DC

## 2017-08-24 NOTE — Assessment & Plan Note (Signed)
No changes in current management, will follow labs done today and will give further recommendations accordingly. Follow-up in 6-12 months. 

## 2017-08-24 NOTE — Assessment & Plan Note (Signed)
Recommend avoiding NSAIDs. Adequate hydration and low-salt diet. Adequate BP control. Continue Avapro.

## 2017-08-24 NOTE — Assessment & Plan Note (Signed)
Adequately controlled. No changes in current management. Continue low salt diet. Eye exam recommended annually. F/U in 6 months, before if needed.  

## 2017-08-24 NOTE — Patient Instructions (Signed)
A few things to remember from today's visit:   Hypertension, essential, benign - Plan: irbesartan (AVAPRO) 150 MG tablet, Basic metabolic panel  Hypothyroidism (acquired) - Plan: TSH  CKD (chronic kidney disease), stage III ([place]) - Plan: Basic metabolic panel, Microalbumin / creatinine urine ratio  To preserve kidney function and/or slow progression of disease:  Low salt diet and adequate hydration. Avoiding medicines known as "nonsteroidal anti-inflammatory drugs," or NSAIDs. These medicines include ibuprofen (sample brand names: Advil, Motrin) and naproxen (sample brand name: Aleve).  Adequate blood pressure control. Low phosphorus diet.   Please be sure medication list is accurate. If a new problem present, please set up appointment sooner than planned today.

## 2017-08-24 NOTE — Progress Notes (Signed)
HPI:   [name] is a 83 y.o. female, who is here today to establish care.  Former PCP: Dr Jennifer Goodman Last preventive routine visit: 04/2017  Chronic medical problems: Hypertension, RA, OA, GERD, CKD 3. In 02/2017 she underwent open reduction internal fixation of.  Right proximal olecranon fracture she follows with orthopedist, Dr. Caralyn Goodman.  She lives with her husband, who is independent.  Concerns today: Lab work, which according to patient was done by her former PCP every 6 months.   Hypertension:   Diagnosed about 2 years ago. Currently on Avapro 150 mg daily. Last eye exam within the past year. She does not check her BP regularly at home.   She is taking medications as instructed, no side effects reported.  She has not noted unusual headache, visual changes, exertional chest pain, dyspnea,  focal weakness, or edema.   Lab Results  Component Value Date   CREATININE 1.09 (H) 02/25/2017   BUN 16 02/25/2017   NA 135 02/25/2017   K 5.2 (H) 02/25/2017   CL 105 02/25/2017   CO2 17 (L) 02/25/2017   She brought a copy of recent labs done at her rheumatologist's office, K+ 5.1 and calcium elevated at 10.4.  According to patient, calcium was repeated and was in the normal range.  CKD III, she denies form in the urine, gross hematuria, or decreased urine output. Chronic use of NSAID's.  Hx of RA and OA, she follows with rheumatologist every 3 months. She is on Enbrel and Methotrexate.  Hypothyroidism diagnosed in 2010. Currently she is on Levothyroxine 50 mcg daily. She has no noted constipation/diarrhea, abnormal weight loss, cold/hot intolerance, palpitations, or tremor. Last TSH in  03/2017, normal at 1.3.  GERD: She takes Protonix 20 mg daily as needed. Currently she is asymptomatic.   Review of Systems  Constitutional: Negative for activity change, appetite change, fatigue, fever and unexpected weight change.  HENT: Negative for mouth sores,  nosebleeds and trouble swallowing.   Eyes: Negative for redness and visual disturbance.  Respiratory: Negative for cough, shortness of breath and wheezing.   Cardiovascular: Negative for chest pain, palpitations and leg swelling.  Gastrointestinal: Negative for abdominal pain, nausea and vomiting.       Negative for changes in bowel habits.  Endocrine: Negative for cold intolerance and heat intolerance.  Genitourinary: Negative for decreased urine volume, dysuria and hematuria.  Musculoskeletal: Positive for arthralgias and back pain. Negative for myalgias.  Skin: Negative for rash and wound.  Neurological: Negative for syncope, weakness and headaches.  Psychiatric/Behavioral: Negative for confusion. The patient is not nervous/anxious.       Current Outpatient Medications on File Prior to Visit  Medication Sig Dispense Refill  . Cholecalciferol (VITAMIN D3) 5000 UNITS TABS Take 1 tablet by mouth daily.    . cyanocobalamin (,VITAMIN B-12,) 1000 MCG/ML injection Inject 1,000 mcg into the muscle every 30 (thirty) days.    Marland Kitchen etanercept (ENBREL) 50 MG/ML injection Inject 50 mg into the skin once a week. Thursdays    . folic acid (FOLVITE) 1 MG tablet Take 1 mg by mouth daily.    Marland Kitchen HYDROcodone-acetaminophen (NORCO/VICODIN) 5-325 MG tablet Take 1-2 tablets by mouth every 4 (four) hours as needed for moderate pain or severe pain. 20 tablet 0  . levothyroxine (SYNTHROID, LEVOTHROID) 50 MCG tablet Take 50 mcg by mouth daily before breakfast.    . methotrexate (RHEUMATREX) 2.5 MG tablet Take 20 mg by mouth every Monday.     Marland Kitchen  fentaNYL (DURAGESIC - DOSED MCG/HR) 25 MCG/HR Place 1 patch onto the skin every 3 (three) days.    Marland Kitchen HYDROcodone-acetaminophen (NORCO) 10-325 MG tablet Take 1-2 tablets by mouth every 6 (six) hours as needed for moderate pain or severe pain. (Patient not taking: Reported on 08/24/2017) 60 tablet 0  . pantoprazole (PROTONIX) 20 MG tablet Take 1 tablet (20 mg total) by mouth daily.  (Patient not taking: Reported on 08/24/2017) 30 tablet 0   No current facility-administered medications on file prior to visit.      Past Medical History:  Diagnosis Date  . Anemia   . Arthritis    Rhuematoid-takes Enbrel and Methorexate   . Back pain    states in Jan 2017 spinal was attempted 4 times and has had back pain since.  . Cancer ([place])    skin cancer on head  . Chicken pox   . Diverticulosis   . History of colon polyps    benign  . Hypertension    takes Avapro daily  . Hypothyroidism    takes Synthroid daily  . Joint pain   . Pneumonia    at age 31  . PONV (postoperative nausea and vomiting)    the patch helps!  . Urinary frequency   . Urinary urgency    was taking Myrbetriq but stopped.Plans to start back   Allergies  Allergen Reactions  . Oxycodone Other (See Comments)    Makes hallucinates    Family History  Problem Relation Age of Onset  . Cancer Father        colon ca/ lung ca  . Heart disease Father     Social History   Socioeconomic History  . Marital status: Married    Spouse name: None  . Number of children: 2  . Years of education: None  . Highest education level: None  Social Needs  . Financial resource strain: None  . Food insecurity - worry: None  . Food insecurity - inability: None  . Transportation needs - medical: None  . Transportation needs - non-medical: None  Occupational History  . None  Tobacco Use  . Smoking status: Former Smoker    Last attempt to quit: 06/14/1978    Years since quitting: 39.2  . Smokeless tobacco: Never Used  . Tobacco comment: quit 32 yrs ago  Substance and Sexual Activity  . Alcohol use: Yes    Comment: occ  . Drug use: No  . Sexual activity: No  Other Topics Concern  . None  Social History Narrative  . None    Vitals:   08/24/17 1008  BP: 133/78  Pulse: 88  Resp: 12  Temp: 97.8 F (36.6 C)  SpO2: 97%    Body mass index is 25.92 kg/m.   Physical Exam  Nursing note and vitals  reviewed. Constitutional: She is oriented to person, place, and time. She appears well-developed. No distress.  HENT:  Head: Normocephalic and atraumatic.  Mouth/Throat: Oropharynx is clear and moist and mucous membranes are normal. She has dentures.  Eyes: Conjunctivae are normal. Pupils are equal, round, and reactive to light.  Neck: No tracheal deviation present. No thyroid mass and no thyromegaly present.  Cardiovascular: Normal rate and regular rhythm.  No murmur heard. Pulses:      Dorsalis pedis pulses are 2+ on the right side, and 2+ on the left side.  Respiratory: Effort normal and breath sounds normal. No respiratory distress.  GI: Soft. She exhibits no mass. There is no  hepatomegaly. There is no tenderness.  Musculoskeletal: She exhibits no edema.  Lymphadenopathy:    She has no cervical adenopathy.  Neurological: She is alert and oriented to person, place, and time. She has normal strength.  Mildly unstable gait with no assistance.  Skin: Skin is warm. No erythema.  Psychiatric: She has a normal mood and affect.  Well groomed, good eye contact.    ASSESSMENT AND PLAN:  [name] was seen today for establish care.  Orders Placed This Encounter  Procedures  . Basic metabolic panel  . TSH  . Microalbumin / creatinine urine ratio    Hypertension, essential, benign Adequately controlled. No changes in current management. Continue low-salt diet. Eye exam recommended annually. F/U in 6 months, before if needed.   CKD (chronic kidney disease), stage III ([place]) Recommend avoiding NSAIDs. Adequate hydration and low-salt diet. Adequate BP control. Continue Avapro.   Hypothyroidism (acquired) No changes in current management, will follow labs done today and will give further recommendations accordingly. Follow-up in 6-12 months.      Betty G. Martinique, MD  [facility]. [place] office.

## 2017-08-25 ENCOUNTER — Other Ambulatory Visit: Payer: Self-pay | Admitting: Orthopedic Surgery

## 2017-08-25 DIAGNOSIS — M5416 Radiculopathy, lumbar region: Secondary | ICD-10-CM

## 2017-08-30 ENCOUNTER — Telehealth: Payer: Self-pay | Admitting: Family Medicine

## 2017-08-30 NOTE — Telephone Encounter (Signed)
Labs in result notes.

## 2017-08-30 NOTE — Telephone Encounter (Signed)
Copied from [place]. Topic: Quick Communication - Lab Results >> Aug 30, 2017  1:47 PM Zacarias Pontes, CMA wrote: Called patient to inform them of their lab results. When patient returns call, triage nurse may disclose results.  Patient is calling for results. Nurse line busy. Please contact pt.

## 2017-09-06 ENCOUNTER — Ambulatory Visit
Admission: RE | Admit: 2017-09-06 | Discharge: 2017-09-06 | Disposition: A | Discharge status: Home / Self Care | Payer: Medicare Other | Source: Ambulatory Visit | Attending: Orthopedic Surgery | Admitting: Orthopedic Surgery

## 2017-09-06 DIAGNOSIS — M79671 Pain in right foot: Secondary | ICD-10-CM | POA: Diagnosis not present

## 2017-09-06 DIAGNOSIS — M48061 Spinal stenosis, lumbar region without neurogenic claudication: Secondary | ICD-10-CM | POA: Diagnosis not present

## 2017-09-06 DIAGNOSIS — M5416 Radiculopathy, lumbar region: Secondary | ICD-10-CM

## 2017-09-20 DIAGNOSIS — M79671 Pain in right foot: Secondary | ICD-10-CM | POA: Diagnosis not present

## 2017-09-20 DIAGNOSIS — M5441 Lumbago with sciatica, right side: Secondary | ICD-10-CM | POA: Diagnosis not present

## 2017-09-21 DIAGNOSIS — M21162 Varus deformity, not elsewhere classified, left knee: Secondary | ICD-10-CM | POA: Diagnosis not present

## 2017-09-21 DIAGNOSIS — Z96653 Presence of artificial knee joint, bilateral: Secondary | ICD-10-CM | POA: Diagnosis not present

## 2017-09-21 DIAGNOSIS — M25562 Pain in left knee: Secondary | ICD-10-CM | POA: Diagnosis not present

## 2017-09-21 DIAGNOSIS — Z96652 Presence of left artificial knee joint: Secondary | ICD-10-CM | POA: Diagnosis not present

## 2017-09-21 DIAGNOSIS — M25561 Pain in right knee: Secondary | ICD-10-CM | POA: Diagnosis not present

## 2017-09-27 DIAGNOSIS — M25561 Pain in right knee: Secondary | ICD-10-CM | POA: Diagnosis not present

## 2017-09-27 DIAGNOSIS — M0589 Other rheumatoid arthritis with rheumatoid factor of multiple sites: Secondary | ICD-10-CM | POA: Diagnosis not present

## 2017-09-27 DIAGNOSIS — Z79899 Other long term (current) drug therapy: Secondary | ICD-10-CM | POA: Diagnosis not present

## 2017-09-27 DIAGNOSIS — G894 Chronic pain syndrome: Secondary | ICD-10-CM | POA: Diagnosis not present

## 2017-09-27 DIAGNOSIS — M150 Primary generalized (osteo)arthritis: Secondary | ICD-10-CM | POA: Diagnosis not present

## 2017-10-10 ENCOUNTER — Other Ambulatory Visit: Payer: Self-pay

## 2017-10-10 ENCOUNTER — Emergency Department (INDEPENDENT_AMBULATORY_CARE_PROVIDER_SITE_OTHER)
Admission: EM | Admit: 2017-10-10 | Discharge: 2017-10-10 | Disposition: A | Discharge status: Home / Self Care | Payer: Medicare Other | Source: Home / Self Care

## 2017-10-10 ENCOUNTER — Encounter: Payer: Self-pay | Admitting: Emergency Medicine

## 2017-10-10 ENCOUNTER — Emergency Department (INDEPENDENT_AMBULATORY_CARE_PROVIDER_SITE_OTHER): Payer: Medicare Other

## 2017-10-10 DIAGNOSIS — J40 Bronchitis, not specified as acute or chronic: Secondary | ICD-10-CM

## 2017-10-10 DIAGNOSIS — R0989 Other specified symptoms and signs involving the circulatory and respiratory systems: Secondary | ICD-10-CM | POA: Diagnosis not present

## 2017-10-10 DIAGNOSIS — R05 Cough: Secondary | ICD-10-CM | POA: Diagnosis not present

## 2017-10-10 MED ORDER — DOXYCYCLINE HYCLATE 100 MG PO CAPS: 100.0000 mg | ORAL_CAPSULE | Freq: Two times a day (BID) | ORAL | 0 refills | Status: DC

## 2017-10-10 NOTE — ED Provider Notes (Signed)
[facility]    CSN: [id number] Arrival date & time: 10/10/17  1236     History   Chief Complaint Chief Complaint  Patient presents with  . Cough    HPI Jennifer Goodman is a 83 y.o. female.   The history is provided by the patient. No language interpreter was used.  Cough  Cough characteristics:  Productive Sputum characteristics:  [place] Severity:  Moderate Onset quality:  Gradual Duration:  2 weeks Timing:  Constant Progression:  Worsening Chronicity:  New Smoker: no   Context: upper respiratory infection   Relieved by:  Nothing Worsened by:  Nothing Ineffective treatments:  None tried Associated symptoms: fever and sinus congestion   Risk factors: no recent infection     Past Medical History:  Diagnosis Date  . Anemia   . Arthritis    Rhuematoid-takes Enbrel and Methorexate   . Back pain    states in Jan 2017 spinal was attempted 4 times and has had back pain since.  . Cancer ([place])    skin cancer on head  . Chicken pox   . Diverticulosis   . History of colon polyps    benign  . Hypertension    takes Avapro daily  . Hypothyroidism    takes Synthroid daily  . Joint pain   . Pneumonia    at age 62  . PONV (postoperative nausea and vomiting)    the patch helps!  . Urinary frequency   . Urinary urgency    was taking Myrbetriq but stopped.Plans to start back    Patient Active Problem List   Diagnosis Date Noted  . Hypertension, essential, benign 08/24/2017  . Hypothyroidism (acquired) 08/24/2017  . CKD (chronic kidney disease), stage III ([place]) 08/24/2017  . Infection of urinary tract 05/29/2016  . Primary osteoarthritis of right knee 05/28/2016  . Primary osteoarthritis of left knee 06/20/2015  . Osteoporosis 06/20/2015  . Closed displaced fracture of medial condyle of left femur ([place]) 06/20/2015  . Abdominal pain, RLQ (right lower quadrant) 05/19/2012    Past Surgical History:  Procedure Laterality Date  . CHOLECYSTECTOMY  2000    . COLONOSCOPY    . EYE SURGERY Left    tear duct surgery  . HAND TENDON SURGERY     Dr. Amedeo Plenty  . KNEE ARTHROSCOPY    . ORIF ELBOW FRACTURE Right 02/25/2017   Procedure: OPEN REDUCTION INTERNAL FIXATION (ORIF) RIGHT PROXIMAL OLECRANON FRACTURE;  Surgeon: Iran Planas, MD;  Location: [facility];  Service: Orthopedics;  Laterality: Right;  . TOTAL KNEE ARTHROPLASTY Left 06/20/2015  . TOTAL KNEE ARTHROPLASTY Left 06/20/2015   Procedure: TOTAL KNEE ARTHROPLASTY;  Surgeon: Dorna Leitz, MD;  Location: [facility];  Service: Orthopedics;  Laterality: Left;  . TOTAL KNEE ARTHROPLASTY Right 05/28/2016   Procedure: TOTAL KNEE ARTHROPLASTY;  Surgeon: Dorna Leitz, MD;  Location: [facility];  Service: Orthopedics;  Laterality: Right;    OB History   None      Home Medications    Prior to Admission medications   Medication Sig Start Date End Date Taking? Authorizing Provider  Cholecalciferol (VITAMIN D3) 5000 UNITS TABS Take 1 tablet by mouth daily.    [provider]  cyanocobalamin (,VITAMIN B-12,) 1000 MCG/ML injection Inject 1,000 mcg into the muscle every 30 (thirty) days.    [provider]  doxycycline (VIBRAMYCIN) 100 MG capsule Take 1 capsule (100 mg total) by mouth 2 (two) times daily. 10/10/17   Fransico Meadow, PA-C  etanercept (ENBREL)  50 MG/ML injection Inject 50 mg into the skin once a week. Thursdays    [provider]  fentaNYL (DURAGESIC - DOSED MCG/HR) 25 MCG/HR Place 1 patch onto the skin every 3 (three) days.    [provider]  folic acid (FOLVITE) 1 MG tablet Take 1 mg by mouth daily.    [provider]  irbesartan (AVAPRO) 150 MG tablet TAKE 1 TABLET DAILY FOR 5 DAYS. 08/26/17   Martin[place], Betty G, MD  levothyroxine (SYNTHROID, LEVOTHROID) 50 MCG tablet Take 50 mcg by mouth daily before breakfast.    [provider]  methotrexate (RHEUMATREX) 2.5 MG tablet Take 20 mg by mouth every Monday.     [provider]    Family  History Family History  Problem Relation Age of Onset  . Cancer Father        colon ca/ lung ca  . Heart disease Father     Social History Social History   Tobacco Use  . Smoking status: Former Smoker    Last attempt to quit: 06/14/1978    Years since quitting: 39.3  . Smokeless tobacco: Never Used  . Tobacco comment: quit 32 yrs ago  Substance Use Topics  . Alcohol use: Yes    Comment: occ  . Drug use: No     Allergies   Oxycodone   Review of Systems Review of Systems  Constitutional: Positive for fever.  Respiratory: Positive for cough.   All other systems reviewed and are negative.    Physical Exam Triage Vital Signs ED Triage Vitals  Enc Vitals Group     BP 10/10/17 1310 (!) 149/83     Pulse Rate 10/10/17 1310 76     Resp --      Temp 10/10/17 1310 97.6 F (36.4 C)     Temp Source 10/10/17 1310 Oral     SpO2 10/10/17 1310 99 %     Weight 10/10/17 1311 152 lb (68.9 kg)     Height 10/10/17 1311 5\' 3"  (1.6 m)     Head Circumference --      Peak Flow --      Pain Score 10/10/17 1311 0     Pain Loc --      Pain Edu? --      Excl. in [place]? --    No data found.  Updated Vital Signs BP (!) 149/83 (BP Location: Left Arm)   Pulse 76   Temp 97.6 F (36.4 C) (Oral)   Ht 5\' 3"  (1.6 m)   Wt 152 lb (68.9 kg)   SpO2 99%   BMI 26.93 kg/m   Visual Acuity Right Eye Distance:   Left Eye Distance:   Bilateral Distance:    Right Eye Near:   Left Eye Near:    Bilateral Near:     Physical Exam  Constitutional: She is oriented to person, place, and time. She appears well-developed and well-nourished.  HENT:  Head: Normocephalic.  Right Ear: External ear normal.  Left Ear: External ear normal.  Mouth/Throat: Oropharynx is clear and moist.  Eyes: Pupils are equal, round, and reactive to light. EOM are normal.  Neck: Normal range of motion.  Cardiovascular: Normal rate and regular rhythm.  Pulmonary/Chest: Effort normal.  Abdominal: Soft. She exhibits no  distension.  Musculoskeletal: Normal range of motion.  Neurological: She is alert and oriented to person, place, and time.  Psychiatric: She has a normal mood and affect.  Nursing note and vitals reviewed.  UC Treatments / Results  Labs (all labs ordered are listed, but only abnormal results are displayed) Labs Reviewed - No data to display  EKG None  Radiology Dg Chest 2 View  Result Date: 10/10/2017 CLINICAL DATA:  Cough and congestion for the past 1-2 weeks. EXAM: CHEST - 2 VIEW COMPARISON:  Chest x-ray dated June 10, 2015. FINDINGS: The heart size and mediastinal contours are within normal limits. Normal pulmonary vascularity. Atherosclerotic calcification of the aortic arch. No focal consolidation, pleural effusion, or pneumothorax. No acute osseous abnormality. IMPRESSION: No active cardiopulmonary disease. Electronically Signed   By: Titus Dubin M.D.   On: 10/10/2017 14:47    Procedures Procedures (including critical care time)  Medications Ordered in UC Medications - No data to display  Initial Impression / Assessment and Plan / UC Course  I have reviewed the triage vital signs and the nursing notes.  Pertinent labs & imaging results that were available during my care of the patient were reviewed by me and considered in my medical decision making (see chart for details).      Final Clinical Impressions(s) / UC Diagnoses   Final diagnoses:  Bronchitis     Discharge Instructions     See your Physician for recheck in 3-4 days.  Try zyrtec for allergies    ED Prescriptions    Medication Sig Dispense Auth. Provider   doxycycline (VIBRAMYCIN) 100 MG capsule Take 1 capsule (100 mg total) by mouth 2 (two) times daily. 20 capsule Fransico Meadow, [place]     Controlled Substance Prescriptions [place] Controlled Substance Registry consulted? Not Applicable  An After Visit Summary was printed and given to the patient.    Fransico Meadow, [place] 10/11/17  [phone]

## 2017-10-10 NOTE — ED Triage Notes (Signed)
Pt c/o of cough, congestion for two weeks.

## 2017-10-10 NOTE — Discharge Instructions (Addendum)
See your Physician for recheck in 3-4 days.  Try zyrtec for allergies

## 2017-10-11 ENCOUNTER — Telehealth: Payer: Self-pay

## 2017-10-11 NOTE — Telephone Encounter (Signed)
Spoke to pt. Says she didn't cough as much last night after taking meds. Hopes to see more improvement as she finishes her medication she was given. Advised pt to call should she have any furthers concerns or questions.

## 2017-10-12 DIAGNOSIS — Z79899 Other long term (current) drug therapy: Secondary | ICD-10-CM | POA: Diagnosis not present

## 2017-10-12 DIAGNOSIS — Z885 Allergy status to narcotic agent status: Secondary | ICD-10-CM | POA: Diagnosis not present

## 2017-10-12 DIAGNOSIS — D649 Anemia, unspecified: Secondary | ICD-10-CM | POA: Diagnosis not present

## 2017-10-12 DIAGNOSIS — M50323 Other cervical disc degeneration at C6-C7 level: Secondary | ICD-10-CM | POA: Diagnosis not present

## 2017-10-12 DIAGNOSIS — R2689 Other abnormalities of gait and mobility: Secondary | ICD-10-CM | POA: Diagnosis not present

## 2017-10-12 DIAGNOSIS — M545 Low back pain: Secondary | ICD-10-CM | POA: Diagnosis not present

## 2017-10-12 DIAGNOSIS — M069 Rheumatoid arthritis, unspecified: Secondary | ICD-10-CM | POA: Diagnosis not present

## 2017-10-12 DIAGNOSIS — I10 Essential (primary) hypertension: Secondary | ICD-10-CM | POA: Diagnosis not present

## 2017-10-12 DIAGNOSIS — M519 Unspecified thoracic, thoracolumbar and lumbosacral intervertebral disc disorder: Secondary | ICD-10-CM | POA: Diagnosis not present

## 2017-10-12 DIAGNOSIS — Z882 Allergy status to sulfonamides status: Secondary | ICD-10-CM | POA: Diagnosis not present

## 2017-10-12 DIAGNOSIS — M5032 Other cervical disc degeneration, mid-cervical region, unspecified level: Secondary | ICD-10-CM | POA: Diagnosis not present

## 2017-10-12 DIAGNOSIS — M4312 Spondylolisthesis, cervical region: Secondary | ICD-10-CM | POA: Diagnosis not present

## 2017-10-12 DIAGNOSIS — E079 Disorder of thyroid, unspecified: Secondary | ICD-10-CM | POA: Diagnosis not present

## 2017-10-25 DIAGNOSIS — M47816 Spondylosis without myelopathy or radiculopathy, lumbar region: Secondary | ICD-10-CM | POA: Diagnosis not present

## 2017-11-10 DIAGNOSIS — M47816 Spondylosis without myelopathy or radiculopathy, lumbar region: Secondary | ICD-10-CM | POA: Diagnosis not present

## 2017-11-14 DIAGNOSIS — H26493 Other secondary cataract, bilateral: Secondary | ICD-10-CM | POA: Diagnosis not present

## 2017-11-15 ENCOUNTER — Encounter (HOSPITAL_COMMUNITY): Payer: Self-pay | Admitting: Emergency Medicine

## 2017-11-15 ENCOUNTER — Emergency Department (HOSPITAL_COMMUNITY): Payer: Medicare Other

## 2017-11-15 ENCOUNTER — Other Ambulatory Visit: Payer: Self-pay

## 2017-11-15 ENCOUNTER — Inpatient Hospital Stay (HOSPITAL_COMMUNITY)
Admission: EM | Admit: 2017-11-15 | Discharge: 2017-11-21 | DRG: 501 | Disposition: A | Discharge status: Skilled Nursing Facility | Payer: Medicare Other | Attending: Internal Medicine | Admitting: Internal Medicine

## 2017-11-15 DIAGNOSIS — Z79899 Other long term (current) drug therapy: Secondary | ICD-10-CM

## 2017-11-15 DIAGNOSIS — R0781 Pleurodynia: Secondary | ICD-10-CM | POA: Diagnosis not present

## 2017-11-15 DIAGNOSIS — Y839 Surgical procedure, unspecified as the cause of abnormal reaction of the patient, or of later complication, without mention of misadventure at the time of the procedure: Secondary | ICD-10-CM | POA: Diagnosis not present

## 2017-11-15 DIAGNOSIS — N183 Chronic kidney disease, stage 3 unspecified: Secondary | ICD-10-CM | POA: Diagnosis present

## 2017-11-15 DIAGNOSIS — M069 Rheumatoid arthritis, unspecified: Secondary | ICD-10-CM | POA: Diagnosis present

## 2017-11-15 DIAGNOSIS — S92902A Unspecified fracture of left foot, initial encounter for closed fracture: Secondary | ICD-10-CM | POA: Diagnosis present

## 2017-11-15 DIAGNOSIS — I4891 Unspecified atrial fibrillation: Secondary | ICD-10-CM | POA: Diagnosis not present

## 2017-11-15 DIAGNOSIS — S50812A Abrasion of left forearm, initial encounter: Secondary | ICD-10-CM | POA: Diagnosis not present

## 2017-11-15 DIAGNOSIS — S92032A Displaced avulsion fracture of tuberosity of left calcaneus, initial encounter for closed fracture: Secondary | ICD-10-CM

## 2017-11-15 DIAGNOSIS — M25551 Pain in right hip: Secondary | ICD-10-CM | POA: Diagnosis present

## 2017-11-15 DIAGNOSIS — M8580 Other specified disorders of bone density and structure, unspecified site: Secondary | ICD-10-CM | POA: Diagnosis present

## 2017-11-15 DIAGNOSIS — Z96653 Presence of artificial knee joint, bilateral: Secondary | ICD-10-CM | POA: Diagnosis not present

## 2017-11-15 DIAGNOSIS — Z751 Person awaiting admission to adequate facility elsewhere: Secondary | ICD-10-CM

## 2017-11-15 DIAGNOSIS — S20211D Contusion of right front wall of thorax, subsequent encounter: Secondary | ICD-10-CM | POA: Diagnosis not present

## 2017-11-15 DIAGNOSIS — M79671 Pain in right foot: Secondary | ICD-10-CM | POA: Diagnosis not present

## 2017-11-15 DIAGNOSIS — G8918 Other acute postprocedural pain: Secondary | ICD-10-CM | POA: Diagnosis not present

## 2017-11-15 DIAGNOSIS — Z9189 Other specified personal risk factors, not elsewhere classified: Secondary | ICD-10-CM

## 2017-11-15 DIAGNOSIS — S92352A Displaced fracture of fifth metatarsal bone, left foot, initial encounter for closed fracture: Secondary | ICD-10-CM | POA: Diagnosis not present

## 2017-11-15 DIAGNOSIS — Y92009 Unspecified place in unspecified non-institutional (private) residence as the place of occurrence of the external cause: Secondary | ICD-10-CM

## 2017-11-15 DIAGNOSIS — S92002A Unspecified fracture of left calcaneus, initial encounter for closed fracture: Secondary | ICD-10-CM | POA: Diagnosis not present

## 2017-11-15 DIAGNOSIS — S86012A Strain of left Achilles tendon, initial encounter: Secondary | ICD-10-CM | POA: Diagnosis not present

## 2017-11-15 DIAGNOSIS — M545 Low back pain: Secondary | ICD-10-CM | POA: Diagnosis present

## 2017-11-15 DIAGNOSIS — R269 Unspecified abnormalities of gait and mobility: Secondary | ICD-10-CM | POA: Diagnosis present

## 2017-11-15 DIAGNOSIS — Z885 Allergy status to narcotic agent status: Secondary | ICD-10-CM

## 2017-11-15 DIAGNOSIS — S92351A Displaced fracture of fifth metatarsal bone, right foot, initial encounter for closed fracture: Secondary | ICD-10-CM | POA: Diagnosis not present

## 2017-11-15 DIAGNOSIS — S86092A Other specified injury of left Achilles tendon, initial encounter: Secondary | ICD-10-CM | POA: Diagnosis not present

## 2017-11-15 DIAGNOSIS — I10 Essential (primary) hypertension: Secondary | ICD-10-CM | POA: Diagnosis not present

## 2017-11-15 DIAGNOSIS — S20211A Contusion of right front wall of thorax, initial encounter: Secondary | ICD-10-CM | POA: Diagnosis present

## 2017-11-15 DIAGNOSIS — S92032D Displaced avulsion fracture of tuberosity of left calcaneus, subsequent encounter for fracture with routine healing: Secondary | ICD-10-CM | POA: Diagnosis not present

## 2017-11-15 DIAGNOSIS — R296 Repeated falls: Secondary | ICD-10-CM | POA: Diagnosis not present

## 2017-11-15 DIAGNOSIS — M1990 Unspecified osteoarthritis, unspecified site: Secondary | ICD-10-CM | POA: Diagnosis not present

## 2017-11-15 DIAGNOSIS — D631 Anemia in chronic kidney disease: Secondary | ICD-10-CM | POA: Diagnosis not present

## 2017-11-15 DIAGNOSIS — E875 Hyperkalemia: Secondary | ICD-10-CM | POA: Diagnosis present

## 2017-11-15 DIAGNOSIS — R200 Anesthesia of skin: Secondary | ICD-10-CM | POA: Diagnosis not present

## 2017-11-15 DIAGNOSIS — R2681 Unsteadiness on feet: Secondary | ICD-10-CM | POA: Diagnosis not present

## 2017-11-15 DIAGNOSIS — M810 Age-related osteoporosis without current pathological fracture: Secondary | ICD-10-CM | POA: Diagnosis present

## 2017-11-15 DIAGNOSIS — W19XXXA Unspecified fall, initial encounter: Secondary | ICD-10-CM | POA: Diagnosis not present

## 2017-11-15 DIAGNOSIS — W109XXA Fall (on) (from) unspecified stairs and steps, initial encounter: Secondary | ICD-10-CM | POA: Diagnosis present

## 2017-11-15 DIAGNOSIS — M79672 Pain in left foot: Secondary | ICD-10-CM | POA: Diagnosis present

## 2017-11-15 DIAGNOSIS — M6281 Muscle weakness (generalized): Secondary | ICD-10-CM | POA: Diagnosis not present

## 2017-11-15 DIAGNOSIS — R52 Pain, unspecified: Secondary | ICD-10-CM | POA: Diagnosis not present

## 2017-11-15 DIAGNOSIS — S32599A Other specified fracture of unspecified pubis, initial encounter for closed fracture: Secondary | ICD-10-CM | POA: Diagnosis not present

## 2017-11-15 DIAGNOSIS — Z7401 Bed confinement status: Secondary | ICD-10-CM | POA: Diagnosis not present

## 2017-11-15 DIAGNOSIS — I480 Paroxysmal atrial fibrillation: Secondary | ICD-10-CM

## 2017-11-15 DIAGNOSIS — Z882 Allergy status to sulfonamides status: Secondary | ICD-10-CM

## 2017-11-15 DIAGNOSIS — I129 Hypertensive chronic kidney disease with stage 1 through stage 4 chronic kidney disease, or unspecified chronic kidney disease: Secondary | ICD-10-CM | POA: Diagnosis not present

## 2017-11-15 DIAGNOSIS — I97191 Other postprocedural cardiac functional disturbances following other surgery: Secondary | ICD-10-CM | POA: Diagnosis not present

## 2017-11-15 DIAGNOSIS — S92353A Displaced fracture of fifth metatarsal bone, unspecified foot, initial encounter for closed fracture: Secondary | ICD-10-CM | POA: Diagnosis present

## 2017-11-15 DIAGNOSIS — S299XXA Unspecified injury of thorax, initial encounter: Secondary | ICD-10-CM | POA: Diagnosis not present

## 2017-11-15 DIAGNOSIS — E039 Hypothyroidism, unspecified: Secondary | ICD-10-CM | POA: Diagnosis present

## 2017-11-15 DIAGNOSIS — S86019A Strain of unspecified Achilles tendon, initial encounter: Secondary | ICD-10-CM | POA: Diagnosis present

## 2017-11-15 DIAGNOSIS — M2550 Pain in unspecified joint: Secondary | ICD-10-CM | POA: Diagnosis not present

## 2017-11-15 DIAGNOSIS — Z87891 Personal history of nicotine dependence: Secondary | ICD-10-CM

## 2017-11-15 DIAGNOSIS — S3993XA Unspecified injury of pelvis, initial encounter: Secondary | ICD-10-CM | POA: Diagnosis not present

## 2017-11-15 DIAGNOSIS — S92042A Displaced other fracture of tuberosity of left calcaneus, initial encounter for closed fracture: Secondary | ICD-10-CM | POA: Diagnosis not present

## 2017-11-15 DIAGNOSIS — M6389 Disorders of muscle in diseases classified elsewhere, multiple sites: Secondary | ICD-10-CM | POA: Diagnosis not present

## 2017-11-15 LAB — BASIC METABOLIC PANEL
ANION GAP: 9 (ref 5–15)
BUN: 24 mg/dL — ABNORMAL HIGH (ref 6–20)
CALCIUM: 9 mg/dL (ref 8.9–10.3)
CO2: 24 mmol/L (ref 22–32)
Chloride: 103 mmol/L (ref 101–111)
Creatinine, Ser: 0.93 mg/dL (ref 0.44–1.00)
GFR calc Af Amer: >60 mL/min (ref >60)
GFR, EST NON AFRICAN AMERICAN: 57 mL/min — AB (ref >60)
GLUCOSE: 105 mg/dL — AB (ref 65–99)
Potassium: 4.1 mmol/L (ref 3.5–5.1)
Sodium: 136 mmol/L (ref 135–145)

## 2017-11-15 LAB — CBC WITH DIFFERENTIAL/PLATELET
BASOS ABS: 0 10*3/uL (ref 0.0–0.1)
Basophils Relative: 0 %
EOS PCT: 1 %
Eosinophils Absolute: 0.1 10*3/uL (ref 0.0–0.7)
HCT: 35.9 % — ABNORMAL LOW (ref 36.0–46.0)
Hemoglobin: 11.9 g/dL — ABNORMAL LOW (ref 12.0–15.0)
LYMPHS PCT: 14 %
Lymphs Abs: 1.2 10*3/uL (ref 0.7–4.0)
MCH: 33.5 pg (ref 26.0–34.0)
MCHC: 33.1 g/dL (ref 30.0–36.0)
MCV: 101.1 fL — AB (ref 78.0–100.0)
MONO ABS: 0.7 10*3/uL (ref 0.1–1.0)
Monocytes Relative: 9 %
Neutro Abs: 6.3 10*3/uL (ref 1.7–7.7)
Neutrophils Relative %: 76 %
PLATELETS: 248 10*3/uL (ref 150–400)
RBC: 3.55 MIL/uL — ABNORMAL LOW (ref 3.87–5.11)
RDW: 14.3 % (ref 11.5–15.5)
WBC: 8.2 10*3/uL (ref 4.0–10.5)

## 2017-11-15 MED ORDER — ONDANSETRON HCL 4 MG/2ML IJ SOLN
4.0000 mg | Freq: Once | INTRAMUSCULAR | Status: AC
  Administered 2017-11-15: 4 mg via INTRAVENOUS
  Filled 2017-11-15: qty 2

## 2017-11-15 MED ORDER — FENTANYL CITRATE (PF) 100 MCG/2ML IJ SOLN
50.0000 ug | Freq: Once | INTRAMUSCULAR | Status: AC
  Administered 2017-11-15: 50 ug via INTRAVENOUS
  Filled 2017-11-15: qty 2

## 2017-11-15 MED ORDER — METHOCARBAMOL 500 MG PO TABS
500.0000 mg | ORAL_TABLET | Freq: Once | ORAL | Status: AC
  Administered 2017-11-15: 500 mg via ORAL
  Filled 2017-11-15: qty 1

## 2017-11-15 NOTE — ED Notes (Signed)
ED TO INPATIENT HANDOFF REPORT  Name/Age/Gender Jennifer Goodman 83 y.o. female  Code Status Code Status History    Date Active Date Inactive Code Status Order ID Comments User Context   05/28/2016 1036 05/29/2016 1458 Full Code [id number]  Elmon Else Inpatient   06/20/2015 1631 06/22/2015 1812 Full Code [id number]  Gary Fleet, PA-C Inpatient      Home/SNF/Other Home  Chief Complaint Fall   Level of Care/Admitting Diagnosis ED Disposition    ED Disposition Condition [facility] Area: [facility] [[id number]]  Level of Care: Med-Surg [16]  Diagnosis: Fracture, foot, left, closed, initial encounter [[id number]]  Admitting Physician: Elwyn Reach [[id number]]  Attending Physician: Elwyn Reach [[id number]]  Estimated length of stay: past midnight tomorrow  Certification:: I certify this patient will need inpatient services for at least 2 midnights  PT Class (Do Not Modify): Inpatient [101]  PT Acc Code (Do Not Modify): Private [1]       Medical History Past Medical History:  Diagnosis Date  . Anemia   . Arthritis    Rhuematoid-takes Enbrel and Methorexate   . Back pain    states in Jan 2017 spinal was attempted 4 times and has had back pain since.  . Cancer ([place])    skin cancer on head  . Chicken pox   . Diverticulosis   . History of colon polyps    benign  . Hypertension    takes Avapro daily  . Hypothyroidism    takes Synthroid daily  . Joint pain   . Pneumonia    at age 31  . PONV (postoperative nausea and vomiting)    the patch helps!  . Urinary frequency   . Urinary urgency    was taking Myrbetriq but stopped.Plans to start back    Allergies Allergies  Allergen Reactions  . Oxycodone Other (See Comments)    Makes hallucinates  . Sulfamethoxazole Nausea Only    IV Location/Drains/Wounds Patient Lines/Drains/Airways Status   Active Line/Drains/Airways    Name:   Placement date:   Placement time:   Site:   Days:    Peripheral IV 11/15/17 Right;Posterior Forearm   11/15/17    2052    Forearm   less than 1   Closed System Drain 1 Left Knee Accordion (Hemovac) 10 Fr.   06/20/15    1217    Knee   879   Incision (Closed) 05/28/16 Knee Right   05/28/16    0834     536   Incision (Closed) 02/25/17 Elbow Right   02/25/17    1333     263          Labs/Imaging Results for orders placed or performed during the hospital encounter of 11/15/17 (from the past 48 hour(s))  CBC with Differential     Status: Abnormal   Collection Time: 11/15/17  9:14 PM  Result Value Ref Range   WBC 8.2 4.0 - 10.5 K/uL   RBC 3.55 (L) 3.87 - 5.11 MIL/uL   Hemoglobin 11.9 (L) 12.0 - 15.0 g/dL   HCT 35.9 (L) 36.0 - 46.0 %   MCV 101.1 (H) 78.0 - 100.0 fL   MCH 33.5 26.0 - 34.0 pg   MCHC 33.1 30.0 - 36.0 g/dL   RDW 14.3 11.5 - 15.5 %   Platelets 248 150 - 400 K/uL   Neutrophils Relative % 76 %   Neutro Abs 6.3 1.7 - 7.7 K/uL   Lymphocytes  Relative 14 %   Lymphs Abs 1.2 0.7 - 4.0 K/uL   Monocytes Relative 9 %   Monocytes Absolute 0.7 0.1 - 1.0 K/uL   Eosinophils Relative 1 %   Eosinophils Absolute 0.1 0.0 - 0.7 K/uL   Basophils Relative 0 %   Basophils Absolute 0.0 0.0 - 0.1 K/uL    Comment: Performed at [facility], [place] [address] [place], [place] [address]  Basic metabolic panel     Status: Abnormal   Collection Time: 11/15/17  9:14 PM  Result Value Ref Range   Sodium 136 135 - 145 mmol/L   Potassium 4.1 3.5 - 5.1 mmol/L   Chloride 103 101 - 111 mmol/L   CO2 24 22 - 32 mmol/L   Glucose, Bld 105 (H) 65 - 99 mg/dL   BUN 24 (H) 6 - 20 mg/dL   Creatinine, Ser 0.93 0.44 - 1.00 mg/dL   Calcium 9.0 8.9 - 10.3 mg/dL   GFR calc non Af Amer 57 (L) >60 mL/min   GFR calc Af Amer >60 >60 mL/min    Comment: (NOTE) The eGFR has been calculated using the CKD EPI equation. This calculation has not been validated in all clinical situations. eGFR's persistently <60 mL/min signify possible Chronic  Kidney Disease.    Anion gap 9 5 - 15    Comment: Performed at [facility], [place] [address], [place], [place] [address]   Dg Ribs Unilateral W/chest Right  Result Date: 11/15/2017 CLINICAL DATA:  Fall today coming in from garage.  Right rib pain. EXAM: RIGHT RIBS AND CHEST - 3+ VIEW COMPARISON:  Chest radiograph 10/10/2017 FINDINGS: No fracture or other bone lesions are seen involving the right ribs. Remote left rib fracture, as seen on prior exam. There is no evidence of pneumothorax or pleural effusion. Calcified granuloma in the right lung. Heart size and mediastinal contours are within normal limits. IMPRESSION: Negative radiographs of the chest and right ribs. Electronically Signed   By: Jeb Levering M.D.   On: 11/15/2017 21:55   Dg Lumbar Spine Complete  Result Date: 11/15/2017 CLINICAL DATA:  Fall coming in from garage, lumbosacral back pain. EXAM: LUMBAR SPINE - COMPLETE 4+ VIEW COMPARISON:  Lumbar spine MRI 09/06/2017 FINDINGS: Moderate levo scoliotic curvature of the lumbar spine. Anterolisthesis of L4 on L5 retrolisthesis of L2 on L3 appears degenerative. Remote L1 compression fracture. Remaining lumbar vertebral body heights are preserved. Diffuse disc space narrowing and endplate spurring and facet arthropathy. The bones are under mineralized. Aorto bi-iliac atherosclerosis is incidentally noted. IMPRESSION: 1. No fracture or acute osseous abnormality of the lumbar spine. 2. Scoliosis and multilevel degenerative change. Remote L1 compression fracture. Electronically Signed   By: Jeb Levering M.D.   On: 11/15/2017 21:57   Dg Pelvis 1-2 Views  Result Date: 11/15/2017 CLINICAL DATA:  Fall coming in from garage tonight. EXAM: PELVIS - 1-2 VIEW COMPARISON:  Abdominopelvic CT 05/05/2015 FINDINGS: Remote right pubic body fracture, pubic symphysis is congruent. No acute fracture. Both femoral heads are well seated in the respective acetabula. Sacroiliac joints are  congruent. IMPRESSION: 1. No acute pelvic fracture. 2. Remote right pubic body fracture. Electronically Signed   By: Jeb Levering M.D.   On: 11/15/2017 21:58   Dg Ankle Complete Left  Result Date: 11/15/2017 CLINICAL DATA:  Fall, in from garage.  Left foot and ankle pain. EXAM: LEFT ANKLE COMPLETE - 3+ VIEW COMPARISON:  None. FINDINGS: Displaced avulsion fracture at the posterior calcaneus at  the Achilles tendon insertion. Displaced fracture fragment measures 2.4 cm, there is approximately 2.7 cm distraction posteriorly. Apex dorsal angulation. Base of the fifth metatarsal fracture better assessed on concurrent foot radiographs. No additional fracture of the ankle. Prominent plantar calcaneal spur. The ankle mortise is preserved. Diffuse bony under mineralization. IMPRESSION: 1. Displaced posterior calcaneal fracture at the Achilles insertion with 2.7 cm osseous distraction. 2. Base of the fifth metatarsal fracture, better assessed on concurrent foot radiographs. Electronically Signed   By: Jeb Levering M.D.   On: 11/15/2017 22:05   Dg Foot Complete Left  Result Date: 11/15/2017 CLINICAL DATA:  Status post fall, with bilateral foot pain. Initial encounter. EXAM: LEFT FOOT - COMPLETE 3+ VIEW COMPARISON:  None. FINDINGS: There is a significantly displaced avulsion fracture at the posterosuperior calcaneus, reflecting avulsion of the Achilles tendon. There appear to be fractures through the base of the fifth metatarsal and base of the fifth proximal phalanx, with underlying lucency, suggesting these may be subacute in nature. No additional fractures are seen. There is mild osteopenia of visualized osseous structures. An os peroneum is noted. A plantar calcaneal spur is noted. IMPRESSION: 1. Significantly displaced avulsion fracture at the posterosuperior calcaneus, reflecting avulsion of the Achilles tendon. 2. Fractures through the base of the fifth metatarsal and base of the fifth proximal phalanx, with  underlying lucency, suggesting these may be subacute in nature. 3. Mild osteopenia of visualized osseous structures. 4. Os peroneum noted. Electronically Signed   By: Garald Balding M.D.   On: 11/15/2017 23:01   Dg Foot Complete Right  Result Date: 11/15/2017 CLINICAL DATA:  Fall coming in from garage tonight. Right foot pain. EXAM: RIGHT FOOT COMPLETE - 3+ VIEW COMPARISON:  None. FINDINGS: Fracture at the base of the fifth metatarsal has sclerotic margins and is likely chronic. Remote fractures of the first, second, and third metatarsal shafts. No acute fracture. Bones significantly under mineralized. Small plantar calcaneal spur and Achilles tendon enthesophyte. IMPRESSION: 1. Fracture at the base of the fifth metatarsal has sclerotic margins and is likely chronic, recommend correlation with point tenderness. 2. Remote first, second, and third metatarsal shaft fractures. Electronically Signed   By: Jeb Levering M.D.   On: 11/15/2017 22:59    Pending Labs [employer] (From admission, onward)   Start     Ordered   Signed and Held  CBC  (enoxaparin (LOVENOX)    CrCl >/= 30 ml/min)  Once,   R    Comments:  Baseline for enoxaparin therapy IF NOT ALREADY DRAWN.  Notify MD if PLT < 100 K.    Signed and Held   Signed and Held  Creatinine, serum  (enoxaparin (LOVENOX)    CrCl >/= 30 ml/min)  Once,   R    Comments:  Baseline for enoxaparin therapy IF NOT ALREADY DRAWN.    Signed and Held   Signed and Held  Creatinine, serum  (enoxaparin (LOVENOX)    CrCl >/= 30 ml/min)  Weekly,   R    Comments:  while on enoxaparin therapy    Signed and Held   Signed and Held  Comprehensive metabolic panel  Tomorrow morning,   R     Signed and Held   Signed and Held  CBC  Tomorrow morning,   R     Signed and Held      Vitals/Pain Today's Vitals   11/15/17 2141 11/15/17 2300 11/15/17 2305 11/15/17 2330  BP: (!) 160/75  (!) 160/75 (!) 159/69  Pulse:  80  78 73  Resp:   14   Temp:      TempSrc:       SpO2: 100%  93% 98%  PainSc:  7       Isolation Precautions No active isolations  Medications Medications  fentaNYL (SUBLIMAZE) injection 50 mcg (50 mcg Intravenous Given 11/15/17 2059)  ondansetron (ZOFRAN) injection 4 mg (4 mg Intravenous Given 11/15/17 2059)  methocarbamol (ROBAXIN) tablet 500 mg (500 mg Oral Given 11/15/17 2301)  fentaNYL (SUBLIMAZE) injection 50 mcg (50 mcg Intravenous Given 11/15/17 2302)    Mobility walks

## 2017-11-15 NOTE — H&P (Signed)
History and Physical    Jennifer Goodman WUJ:[id number] DOB: 10-31-1934 DOA: 11/15/2017  Referring MD/NP/PA: Jeannett Senior, PA  PCP: Martinique, Betty G, MD   Outpatient Specialists: Iran Planas, MD Orthopedics   Patient coming from: Home  Chief Complaint: Fall with left foot pain  HPI: Jennifer Goodman is a 83 y.o. female with medical history significant of hypertension, hypothyroidism, osteoarthritis, osteoporosis,previousfalls with multiple orthopedic surgeries who presented to the year today after sustaining mechanical fall at home. She was working on her porch going to was a kitchen when she missed a step and fell. She fell on her right side and heat the [employer]. She denied hitting her head. Patient complained of pain all over but most importantly on the right ribs, right lower back, left foot,bilateral feet as well as left forearm where she has an abrasion. Patient has taken her home pain medication and was given pain medications in the ER but pain is uncontrolled. She has avulsion fracture of the left Achilles tendon and orthopedics has been consulted. They recommended admitting the patient and they will follow-up in the hospital. She is being admitted therefore for pain management.  ED Course: patient's vitals were stable except for blood pressure of 160/75, and white count is 8.2, BUN 24, creatinine 0.93. Hemoglobin is 11.9 and glucose 105. Multiple x-rays of the pelvis and lower extremities showed remote pelvic fractures, Significantly displaced avulsion fracture at the posterosuperior calcaneus, reflecting avulsion of the Achilles tendon and Fractures through the base of the fifth metatarsal and base of the fifth proximal phalanx, with underlying lucency, suggesting these may be subacute in nature  Review of Systems: As per HPI otherwise 10 point review of systems negative.    Past Medical History:  Diagnosis Date  . Anemia   . Arthritis    Rhuematoid-takes Enbrel and  Methorexate   . Back pain    states in Jan 2017 spinal was attempted 4 times and has had back pain since.  . Cancer ([place])    skin cancer on head  . Chicken pox   . Diverticulosis   . History of colon polyps    benign  . Hypertension    takes Avapro daily  . Hypothyroidism    takes Synthroid daily  . Joint pain   . Pneumonia    at age 89  . PONV (postoperative nausea and vomiting)    the patch helps!  . Urinary frequency   . Urinary urgency    was taking Myrbetriq but stopped.Plans to start back    Past Surgical History:  Procedure Laterality Date  . CHOLECYSTECTOMY  2000  . COLONOSCOPY    . EYE SURGERY Left    tear duct surgery  . HAND TENDON SURGERY     Dr. Amedeo Plenty  . KNEE ARTHROSCOPY    . ORIF ELBOW FRACTURE Right 02/25/2017   Procedure: OPEN REDUCTION INTERNAL FIXATION (ORIF) RIGHT PROXIMAL OLECRANON FRACTURE;  Surgeon: Iran Planas, MD;  Location: [place];  Service: Orthopedics;  Laterality: Right;  . TOTAL KNEE ARTHROPLASTY Left 06/20/2015  . TOTAL KNEE ARTHROPLASTY Left 06/20/2015   Procedure: TOTAL KNEE ARTHROPLASTY;  Surgeon: Dorna Leitz, MD;  Location: [facility] [place];  Service: Orthopedics;  Laterality: Left;  . TOTAL KNEE ARTHROPLASTY Right 05/28/2016   Procedure: TOTAL KNEE ARTHROPLASTY;  Surgeon: Dorna Leitz, MD;  Location: [place];  Service: Orthopedics;  Laterality: Right;     reports that she quit smoking about 39 years ago. She has never used smokeless tobacco.  She reports that she drinks alcohol. She reports that she does not use drugs.  Allergies  Allergen Reactions  . Oxycodone Other (See Comments)    Makes hallucinates  . Sulfamethoxazole Nausea Only    Family History  Problem Relation Age of Onset  . Cancer Father        colon ca/ lung ca  . Heart disease Father     Prior to Admission medications   Medication Sig Start Date End Date Taking? Authorizing Provider  calcium carbonate (CALCIUM 600) 600 MG TABS tablet Take 1,200 mg by mouth daily.   Yes  [provider]  Cholecalciferol (VITAMIN D3) 5000 UNITS TABS Take 10,000 tablets by mouth daily.    Yes [provider]  cyanocobalamin (,VITAMIN B-12,) 1000 MCG/ML injection Inject 1,000 mcg into the muscle every 30 (thirty) days.   Yes [provider]  etanercept (ENBREL) 50 MG/ML injection Inject 50 mg into the skin once a week. Thursdays   Yes [provider]  folic acid (FOLVITE) 1 MG tablet Take 1 mg by mouth daily.   Yes [provider]  HYDROcodone-acetaminophen (NORCO/VICODIN) 5-325 MG tablet Take 1 tablet by mouth 2 (two) times daily as needed for moderate pain.   Yes [provider]  irbesartan (AVAPRO) 150 MG tablet TAKE 1 TABLET DAILY FOR 5 DAYS. 08/26/17  Yes Martinique, Betty G, MD  levothyroxine (SYNTHROID, LEVOTHROID) 50 MCG tablet Take 50 mcg by mouth daily before breakfast.   Yes [provider]  methotrexate (RHEUMATREX) 2.5 MG tablet Take 20 mg by mouth every Monday.    Yes [provider]  doxycycline (VIBRAMYCIN) 100 MG capsule Take 1 capsule (100 mg total) by mouth 2 (two) times daily. Patient not taking: Reported on 11/15/2017 10/10/17   Sidney Ace    Physical Exam: Vitals:   11/15/17 1945 11/15/17 1948 11/15/17 2141 11/15/17 2305  BP: (!) 158/84 (!) 158/84 (!) 160/75 (!) 160/75  Pulse:  76 80 78  Resp:  (!) 21  14  Temp:  98 F (36.7 C)    TempSrc:  Oral    SpO2:  100% 100% 93%      Constitutional: NAD, calm, comfortable Vitals:   11/15/17 1945 11/15/17 1948 11/15/17 2141 11/15/17 2305  BP: (!) 158/84 (!) 158/84 (!) 160/75 (!) 160/75  Pulse:  76 80 78  Resp:  (!) 21  14  Temp:  98 F (36.7 C)    TempSrc:  Oral    SpO2:  100% 100% 93%   Patient is crying in severe pain and in distress Eyes: PERRL, lids and conjunctivae normal ENMT: Mucous membranes are moist. Posterior pharynx clear of any exudate or lesions.Normal dentition.  Neck: normal, supple, no masses, no  thyromegaly Respiratory: clear to auscultation bilaterally, no wheezing, no crackles. Normal respiratory effort. No accessory muscle use.  Cardiovascular: Regular rate and rhythm, no murmurs / rubs / gallops. No extremity edema. 2+ pedal pulses. No carotid bruits.  Abdomen: no tenderness, no masses palpated. No hepatosplenomegaly. Bowel sounds positive.  Musculoskeletal: left lower leg is swollen, tender, laterally rotated. Ankle joint deformed  Skin: no rashes, lesions, ulcers. No induration Neurologic: CN 2-12 grossly intact. Sensation intact, DTR normal. Strength 5/5 in all 4.  Psychiatric: Normal judgment and insight. Alert and oriented x 3. Normal mood.   Labs on Admission: I have personally reviewed following labs and imaging studies  CBC: Recent Labs  Lab 11/15/17 2114  WBC 8.2  NEUTROABS 6.3  HGB  11.9*  HCT 35.9*  MCV 101.1*  PLT 151   Basic Metabolic Panel: Recent Labs  Lab 11/15/17 2114  NA 136  K 4.1  CL 103  CO2 24  GLUCOSE 105*  BUN 24*  CREATININE 0.93  CALCIUM 9.0   GFR: CrCl cannot be calculated (Unknown ideal weight.). Liver Function Tests: No results for input(s): AST, ALT, ALKPHOS, BILITOT, PROT, ALBUMIN in the last 168 hours. No results for input(s): LIPASE, AMYLASE in the last 168 hours. No results for input(s): AMMONIA in the last 168 hours. Coagulation Profile: No results for input(s): INR, PROTIME in the last 168 hours. Cardiac Enzymes: No results for input(s): CKTOTAL, CKMB, CKMBINDEX, TROPONINI in the last 168 hours. BNP (last 3 results) No results for input(s): PROBNP in the last 8760 hours. HbA1C: No results for input(s): HGBA1C in the last 72 hours. CBG: No results for input(s): GLUCAP in the last 168 hours. Lipid Profile: No results for input(s): CHOL, HDL, LDLCALC, TRIG, CHOLHDL, LDLDIRECT in the last 72 hours. Thyroid Function Tests: No results for input(s): TSH, T4TOTAL, FREET4, T3FREE, THYROIDAB in the last 72 hours. Anemia  Panel: No results for input(s): VITAMINB12, FOLATE, FERRITIN, TIBC, IRON, RETICCTPCT in the last 72 hours. Urine analysis:    Component Value Date/Time   COLORURINE YELLOW 05/29/2016 0700   APPEARANCEUR CLOUDY (A) 05/29/2016 0700   LABSPEC 1.023 05/29/2016 0700   PHURINE 5.0 05/29/2016 0700   GLUCOSEU 50 (A) 05/29/2016 0700   HGBUR SMALL (A) 05/29/2016 0700   BILIRUBINUR NEGATIVE 05/29/2016 0700   KETONESUR 5 (A) 05/29/2016 0700   PROTEINUR 30 (A) 05/29/2016 0700   NITRITE NEGATIVE 05/29/2016 0700   LEUKOCYTESUR LARGE (A) 05/29/2016 0700   Sepsis Labs: @LABRCNTIP (procalcitonin:4,lacticidven:4) )No results found for this or any previous visit (from the past 240 hour(s)).   Radiological Exams on Admission: Dg Ribs Unilateral W/chest Right  Result Date: 11/15/2017 CLINICAL DATA:  Fall today coming in from garage.  Right rib pain. EXAM: RIGHT RIBS AND CHEST - 3+ VIEW COMPARISON:  Chest radiograph 10/10/2017 FINDINGS: No fracture or other bone lesions are seen involving the right ribs. Remote left rib fracture, as seen on prior exam. There is no evidence of pneumothorax or pleural effusion. Calcified granuloma in the right lung. Heart size and mediastinal contours are within normal limits. IMPRESSION: Negative radiographs of the chest and right ribs. Electronically Signed   By: Jeb Levering M.D.   On: 11/15/2017 21:55   Dg Lumbar Spine Complete  Result Date: 11/15/2017 CLINICAL DATA:  Fall coming in from garage, lumbosacral back pain. EXAM: LUMBAR SPINE - COMPLETE 4+ VIEW COMPARISON:  Lumbar spine MRI 09/06/2017 FINDINGS: Moderate levo scoliotic curvature of the lumbar spine. Anterolisthesis of L4 on L5 retrolisthesis of L2 on L3 appears degenerative. Remote L1 compression fracture. Remaining lumbar vertebral body heights are preserved. Diffuse disc space narrowing and endplate spurring and facet arthropathy. The bones are under mineralized. Aorto bi-iliac atherosclerosis is incidentally  noted. IMPRESSION: 1. No fracture or acute osseous abnormality of the lumbar spine. 2. Scoliosis and multilevel degenerative change. Remote L1 compression fracture. Electronically Signed   By: Jeb Levering M.D.   On: 11/15/2017 21:57   Dg Pelvis 1-2 Views  Result Date: 11/15/2017 CLINICAL DATA:  Fall coming in from garage tonight. EXAM: PELVIS - 1-2 VIEW COMPARISON:  Abdominopelvic CT 05/05/2015 FINDINGS: Remote right pubic body fracture, pubic symphysis is congruent. No acute fracture. Both femoral heads are well seated in the respective acetabula. Sacroiliac joints are congruent. IMPRESSION:  1. No acute pelvic fracture. 2. Remote right pubic body fracture. Electronically Signed   By: Jeb Levering M.D.   On: 11/15/2017 21:58   Dg Ankle Complete Left  Result Date: 11/15/2017 CLINICAL DATA:  Fall, in from garage.  Left foot and ankle pain. EXAM: LEFT ANKLE COMPLETE - 3+ VIEW COMPARISON:  None. FINDINGS: Displaced avulsion fracture at the posterior calcaneus at the Achilles tendon insertion. Displaced fracture fragment measures 2.4 cm, there is approximately 2.7 cm distraction posteriorly. Apex dorsal angulation. Base of the fifth metatarsal fracture better assessed on concurrent foot radiographs. No additional fracture of the ankle. Prominent plantar calcaneal spur. The ankle mortise is preserved. Diffuse bony under mineralization. IMPRESSION: 1. Displaced posterior calcaneal fracture at the Achilles insertion with 2.7 cm osseous distraction. 2. Base of the fifth metatarsal fracture, better assessed on concurrent foot radiographs. Electronically Signed   By: Jeb Levering M.D.   On: 11/15/2017 22:05   Dg Foot Complete Left  Result Date: 11/15/2017 CLINICAL DATA:  Status post fall, with bilateral foot pain. Initial encounter. EXAM: LEFT FOOT - COMPLETE 3+ VIEW COMPARISON:  None. FINDINGS: There is a significantly displaced avulsion fracture at the posterosuperior calcaneus, reflecting avulsion of  the Achilles tendon. There appear to be fractures through the base of the fifth metatarsal and base of the fifth proximal phalanx, with underlying lucency, suggesting these may be subacute in nature. No additional fractures are seen. There is mild osteopenia of visualized osseous structures. An os peroneum is noted. A plantar calcaneal spur is noted. IMPRESSION: 1. Significantly displaced avulsion fracture at the posterosuperior calcaneus, reflecting avulsion of the Achilles tendon. 2. Fractures through the base of the fifth metatarsal and base of the fifth proximal phalanx, with underlying lucency, suggesting these may be subacute in nature. 3. Mild osteopenia of visualized osseous structures. 4. Os peroneum noted. Electronically Signed   By: Garald Balding M.D.   On: 11/15/2017 23:01   Dg Foot Complete Right  Result Date: 11/15/2017 CLINICAL DATA:  Fall coming in from garage tonight. Right foot pain. EXAM: RIGHT FOOT COMPLETE - 3+ VIEW COMPARISON:  None. FINDINGS: Fracture at the base of the fifth metatarsal has sclerotic margins and is likely chronic. Remote fractures of the first, second, and third metatarsal shafts. No acute fracture. Bones significantly under mineralized. Small plantar calcaneal spur and Achilles tendon enthesophyte. IMPRESSION: 1. Fracture at the base of the fifth metatarsal has sclerotic margins and is likely chronic, recommend correlation with point tenderness. 2. Remote first, second, and third metatarsal shaft fractures. Electronically Signed   By: Jeb Levering M.D.   On: 11/15/2017 22:59      Assessment/Plan Principal Problem:   Achilles tendon avulsion Active Problems:   Hypertension, essential, benign   Hypothyroidism (acquired)   CKD (chronic kidney disease), stage III (HCC)   Fall   Displaced fracture of fifth metatarsal bone, left foot, initial encounter for closed fracture   Fracture, foot, left, closed, initial encounter    #1 Multiple fractures of the  left lower extremity: Patient had avulsion fracture of the Achilles tendon, displaced fracture of the fifth metatarsal bone at the base also, remote pelvic rami fractures and osteopenia. Patient will be admitted for pain management and physical therapy. Orthopedics consulted and will follow recommendation.  #2 S/P Fall: patient will need extensive physical therapy and occupational therapy. With evidence of old fractures it appears patient has had recurrent falls. Probably gait abnormalities. We will follow physical therapy recommendations. She lives with her husband  I would like to get back home instead of skilled facility.  #3 hypertension: Continue with blood pressure medications from home  #4 hypothyroidism: Continue levothyroxine.  #5 chronic kidney disease stage II: Patient is at baseline.   DVT prophylaxis: Lovenox  Code Status: full code  Family Communication: Husband at the bedside  Disposition Plan: to be determined  Consults called: Orthopedics, Dr. Shireen Quan Admission status: inpatient/medsurg  Severity of Illness: The appropriate patient status for this patient is INPATIENT. Inpatient status is judged to be reasonable and necessary in order to provide the required intensity of service to ensure the patient's safety. The patient's presenting symptoms, physical exam findings, and initial radiographic and laboratory data in the context of their chronic comorbidities is felt to place them at high risk for further clinical deterioration. Furthermore, it is not anticipated that the patient will be medically stable for discharge from the hospital within 2 midnights of admission. The following factors support the patient status of inpatient.   " The patient's presenting symptoms include Fall and leg pain. " The worrisome physical exam findings include left foot swelling. " The initial radiographic and laboratory data are worrisome because of Avulsion fracture of the left achilles  tendon. " The chronic co-morbidities include Recurrent fall.   * I certify that at the point of admission it is my clinical judgment that the patient will require inpatient hospital care spanning beyond 2 midnights from the point of admission due to high intensity of service, high risk for further deterioration and high frequency of surveillance required.Barbette Merino MD [facility] Pager [phone]  If 7PM-7AM, please contact night-coverage [email or web address] Password [email or web address]  11/15/2017, 11:26 PM

## 2017-11-15 NOTE — ED Triage Notes (Signed)
Pt arriving from home for fall. Pt having achilles tendon pain on the left side since last week which was exacerbated by the fall today. Pt also having right flank pain. Pt states she can't put weight on the left ankle.

## 2017-11-15 NOTE — ED Notes (Signed)
Pt has pur wik placed.  Pt sts she is having muscle spasms in left leg/ankle area.

## 2017-11-15 NOTE — ED Provider Notes (Signed)
[facility] DEPT Provider Note   CSN: [id number] Arrival date & time: 11/15/17  1932     History   Chief Complaint Chief Complaint  Patient presents with  . Fall    HPI Jennifer Goodman is a 83 y.o. female.  HPI Jennifer Goodman is a 83 y.o. female with history of anemia, rheumatoid arthritis, chronic joint pain, chronic back pain, hypertension, presents to emergency department with complaint of a fall.  Patient states she missed a step from the porch into her kitchen and fell onto her right side, sliding into the [employer].  She states she had right flank onto the island.  She denies hitting her head or loss of consciousness.  She is complaining of pain to the right ribs, right lower back, bilateral feet, left Achilles tendon, and she sustained skin abrasion to the left forearm.  Past Medical History:  Diagnosis Date  . Anemia   . Arthritis    Rhuematoid-takes Enbrel and Methorexate   . Back pain    states in Jan 2017 spinal was attempted 4 times and has had back pain since.  . Cancer ([place])    skin cancer on head  . Chicken pox   . Diverticulosis   . History of colon polyps    benign  . Hypertension    takes Avapro daily  . Hypothyroidism    takes Synthroid daily  . Joint pain   . Pneumonia    at age 48  . PONV (postoperative nausea and vomiting)    the patch helps!  . Urinary frequency   . Urinary urgency    was taking Myrbetriq but stopped.Plans to start back    Patient Active Problem List   Diagnosis Date Noted  . Hypertension, essential, benign 08/24/2017  . Hypothyroidism (acquired) 08/24/2017  . CKD (chronic kidney disease), stage III ([place]) 08/24/2017  . Infection of urinary tract 05/29/2016  . Primary osteoarthritis of right knee 05/28/2016  . Primary osteoarthritis of left knee 06/20/2015  . Osteoporosis 06/20/2015  . Closed displaced fracture of medial condyle of left femur ([place]) 06/20/2015  . Abdominal pain, RLQ  (right lower quadrant) 05/19/2012    Past Surgical History:  Procedure Laterality Date  . CHOLECYSTECTOMY  2000  . COLONOSCOPY    . EYE SURGERY Left    tear duct surgery  . HAND TENDON SURGERY     Dr. Amedeo Plenty  . KNEE ARTHROSCOPY    . ORIF ELBOW FRACTURE Right 02/25/2017   Procedure: OPEN REDUCTION INTERNAL FIXATION (ORIF) RIGHT PROXIMAL OLECRANON FRACTURE;  Surgeon: Iran Planas, MD;  Location: [facility];  Service: Orthopedics;  Laterality: Right;  . TOTAL KNEE ARTHROPLASTY Left 06/20/2015  . TOTAL KNEE ARTHROPLASTY Left 06/20/2015   Procedure: TOTAL KNEE ARTHROPLASTY;  Surgeon: Dorna Leitz, MD;  Location: [facility];  Service: Orthopedics;  Laterality: Left;  . TOTAL KNEE ARTHROPLASTY Right 05/28/2016   Procedure: TOTAL KNEE ARTHROPLASTY;  Surgeon: Dorna Leitz, MD;  Location: [facility];  Service: Orthopedics;  Laterality: Right;     OB History   None      Home Medications    Prior to Admission medications   Medication Sig Start Date End Date Taking? Authorizing Provider  Cholecalciferol (VITAMIN D3) 5000 UNITS TABS Take 1 tablet by mouth daily.    [provider]  cyanocobalamin (,VITAMIN B-12,) 1000 MCG/ML injection Inject 1,000 mcg into the muscle every 30 (thirty) days.    [provider]  doxycycline (VIBRAMYCIN) 100 MG capsule  Take 1 capsule (100 mg total) by mouth 2 (two) times daily. 10/10/17   Fransico Meadow, PA-C  etanercept (ENBREL) 50 MG/ML injection Inject 50 mg into the skin once a week. Thursdays    [provider]  fentaNYL (DURAGESIC - DOSED MCG/HR) 25 MCG/HR Place 1 patch onto the skin every 3 (three) days.    [provider]  folic acid (FOLVITE) 1 MG tablet Take 1 mg by mouth daily.    [provider]  irbesartan (AVAPRO) 150 MG tablet TAKE 1 TABLET DAILY FOR 5 DAYS. 08/26/17   Martinique, Betty G, MD  levothyroxine (SYNTHROID, LEVOTHROID) 50 MCG tablet Take 50 mcg by mouth daily before breakfast.    [provider]    methotrexate (RHEUMATREX) 2.5 MG tablet Take 20 mg by mouth every Monday.     [provider]    Family History Family History  Problem Relation Age of Onset  . Cancer Father        colon ca/ lung ca  . Heart disease Father     Social History Social History   Tobacco Use  . Smoking status: Former Smoker    Last attempt to quit: 06/14/1978    Years since quitting: 39.4  . Smokeless tobacco: Never Used  . Tobacco comment: quit 32 yrs ago  Substance Use Topics  . Alcohol use: Yes    Comment: occ  . Drug use: No     Allergies   Oxycodone   Review of Systems Review of Systems  Constitutional: Negative for chills and fever.  Respiratory: Negative for cough, chest tightness and shortness of breath.   Cardiovascular: Negative for chest pain, palpitations and leg swelling.  Gastrointestinal: Negative for abdominal pain, diarrhea, nausea and vomiting.  Genitourinary: Negative for dysuria, flank pain and pelvic pain.  Musculoskeletal: Positive for arthralgias, back pain and myalgias. Negative for neck pain and neck stiffness.  Skin: Negative for rash.  Neurological: Negative for dizziness, weakness and headaches.  All other systems reviewed and are negative.    Physical Exam Updated Vital Signs BP (!) 158/84 (BP Location: Left Arm)   Pulse 76   Temp 98 F (36.7 C) (Oral)   Resp (!) 21   SpO2 100%   Physical Exam  Constitutional: She appears well-developed and well-nourished. No distress.  HENT:  Head: Normocephalic.  Eyes: Conjunctivae are normal.  Neck: Normal range of motion. Neck supple.  Cardiovascular: Normal rate, regular rhythm and normal heart sounds.  Pulmonary/Chest: Effort normal and breath sounds normal. No respiratory distress. She has no wheezes. She has no rales.  Abdominal: Soft. Bowel sounds are normal. She exhibits no distension. There is no tenderness. There is no rebound.  Musculoskeletal: She exhibits no edema.  Contusion noted to the  right flank.  Tender to palpation over right posterior lower ribs as well as midline lumbar spine.  Full range of motion bilateral hips and knees.  Tenderness to palpation over all of the toes.  Tenderness to palpation over left Achilles tendon, Achilles tendon seems to be intact.  She is able to plantar flex and dorsiflex left foot, however it is painful.  There is a contusion noted over the left Achilles at insertion of the Achilles tendon.  Skin abrasions to left forearm.  Neurological: She is alert.  Skin: Skin is warm and dry.  Psychiatric: She has a normal mood and affect. Her behavior is normal.  Nursing note and vitals reviewed.    ED Treatments / Results  Labs (all labs ordered are listed, but only abnormal results are displayed) Labs Reviewed  CBC WITH DIFFERENTIAL/PLATELET - Abnormal; Notable for the following components:      Result Value   RBC 3.55 (*)    Hemoglobin 11.9 (*)    HCT 35.9 (*)    MCV 101.1 (*)    All other components within normal limits  BASIC METABOLIC PANEL - Abnormal; Notable for the following components:   Glucose, Bld 105 (*)    BUN 24 (*)    GFR calc non Af Amer 57 (*)    All other components within normal limits    EKG None  Radiology Dg Ribs Unilateral W/chest Right  Result Date: 11/15/2017 CLINICAL DATA:  Fall today coming in from garage.  Right rib pain. EXAM: RIGHT RIBS AND CHEST - 3+ VIEW COMPARISON:  Chest radiograph 10/10/2017 FINDINGS: No fracture or other bone lesions are seen involving the right ribs. Remote left rib fracture, as seen on prior exam. There is no evidence of pneumothorax or pleural effusion. Calcified granuloma in the right lung. Heart size and mediastinal contours are within normal limits. IMPRESSION: Negative radiographs of the chest and right ribs. Electronically Signed   By: Jeb Levering M.D.   On: 11/15/2017 21:55   Dg Lumbar Spine Complete  Result Date: 11/15/2017 CLINICAL DATA:  Fall coming in from garage,  lumbosacral back pain. EXAM: LUMBAR SPINE - COMPLETE 4+ VIEW COMPARISON:  Lumbar spine MRI 09/06/2017 FINDINGS: Moderate levo scoliotic curvature of the lumbar spine. Anterolisthesis of L4 on L5 retrolisthesis of L2 on L3 appears degenerative. Remote L1 compression fracture. Remaining lumbar vertebral body heights are preserved. Diffuse disc space narrowing and endplate spurring and facet arthropathy. The bones are under mineralized. Aorto bi-iliac atherosclerosis is incidentally noted. IMPRESSION: 1. No fracture or acute osseous abnormality of the lumbar spine. 2. Scoliosis and multilevel degenerative change. Remote L1 compression fracture. Electronically Signed   By: Jeb Levering M.D.   On: 11/15/2017 21:57   Dg Pelvis 1-2 Views  Result Date: 11/15/2017 CLINICAL DATA:  Fall coming in from garage tonight. EXAM: PELVIS - 1-2 VIEW COMPARISON:  Abdominopelvic CT 05/05/2015 FINDINGS: Remote right pubic body fracture, pubic symphysis is congruent. No acute fracture. Both femoral heads are well seated in the respective acetabula. Sacroiliac joints are congruent. IMPRESSION: 1. No acute pelvic fracture. 2. Remote right pubic body fracture. Electronically Signed   By: Jeb Levering M.D.   On: 11/15/2017 21:58   Dg Ankle Complete Left  Result Date: 11/15/2017 CLINICAL DATA:  Fall, in from garage.  Left foot and ankle pain. EXAM: LEFT ANKLE COMPLETE - 3+ VIEW COMPARISON:  None. FINDINGS: Displaced avulsion fracture at the posterior calcaneus at the Achilles tendon insertion. Displaced fracture fragment measures 2.4 cm, there is approximately 2.7 cm distraction posteriorly. Apex dorsal angulation. Base of the fifth metatarsal fracture better assessed on concurrent foot radiographs. No additional fracture of the ankle. Prominent plantar calcaneal spur. The ankle mortise is preserved. Diffuse bony under mineralization. IMPRESSION: 1. Displaced posterior calcaneal fracture at the Achilles insertion with 2.7 cm  osseous distraction. 2. Base of the fifth metatarsal fracture, better assessed on concurrent foot radiographs. Electronically Signed   By: Jeb Levering M.D.   On: 11/15/2017 22:05   Dg Foot Complete Left  Result Date: 11/15/2017 CLINICAL DATA:  Status post fall, with bilateral foot pain. Initial encounter. EXAM: LEFT FOOT - COMPLETE 3+ VIEW COMPARISON:  None. FINDINGS: There is a significantly displaced avulsion fracture at  the posterosuperior calcaneus, reflecting avulsion of the Achilles tendon. There appear to be fractures through the base of the fifth metatarsal and base of the fifth proximal phalanx, with underlying lucency, suggesting these may be subacute in nature. No additional fractures are seen. There is mild osteopenia of visualized osseous structures. An os peroneum is noted. A plantar calcaneal spur is noted. IMPRESSION: 1. Significantly displaced avulsion fracture at the posterosuperior calcaneus, reflecting avulsion of the Achilles tendon. 2. Fractures through the base of the fifth metatarsal and base of the fifth proximal phalanx, with underlying lucency, suggesting these may be subacute in nature. 3. Mild osteopenia of visualized osseous structures. 4. Os peroneum noted. Electronically Signed   By: Garald Balding M.D.   On: 11/15/2017 23:01   Dg Foot Complete Right  Result Date: 11/15/2017 CLINICAL DATA:  Fall coming in from garage tonight. Right foot pain. EXAM: RIGHT FOOT COMPLETE - 3+ VIEW COMPARISON:  None. FINDINGS: Fracture at the base of the fifth metatarsal has sclerotic margins and is likely chronic. Remote fractures of the first, second, and third metatarsal shafts. No acute fracture. Bones significantly under mineralized. Small plantar calcaneal spur and Achilles tendon enthesophyte. IMPRESSION: 1. Fracture at the base of the fifth metatarsal has sclerotic margins and is likely chronic, recommend correlation with point tenderness. 2. Remote first, second, and third metatarsal  shaft fractures. Electronically Signed   By: Jeb Levering M.D.   On: 11/15/2017 22:59    Procedures Procedures (including critical care time)  Medications Ordered in ED Medications  fentaNYL (SUBLIMAZE) injection 50 mcg (has no administration in time range)  ondansetron (ZOFRAN) injection 4 mg (has no administration in time range)     Initial Impression / Assessment and Plan / ED Course  I have reviewed the triage vital signs and the nursing notes.  Pertinent labs & imaging results that were available during my care of the patient were reviewed by me and considered in my medical decision making (see chart for details).     Patient with mechanical fall, most of her pain is in the right flank, over right lower ribs.  Also complaining of severe pain in the left Achilles tendon.  Will get imaging. Pain mediations ordered   11:10 PM Rib films do not show any obvious fractures.  Left ankle x-ray shows significantly displaced avulsion fracture at the posterior superior calcaneus, reflecting avulsion of Achilles tendon.  Possible subacute fracture of the fifth metatarsal as well.  No other acute fractures at this time.  Patient's pain is very difficult to manage, she is getting spasms, as well as severe pain in the right flank.  Abdomen reassessed, no tenderness.  I spoke with Dr. Rhona Raider with orthopedics who asked to splint patient in a posterior padded splint, they will consult in the morning if admitted.  Patient is unable to get out of bed due to pain.  I will give her more pain medications and admit to medicine for pain control.  Spoke with medicine, will admit.   Vitals:   11/15/17 1945 11/15/17 1948 11/15/17 2141 11/15/17 2305  BP: (!) 158/84 (!) 158/84 (!) 160/75 (!) 160/75  Pulse:  76 80 78  Resp:  (!) 21  14  Temp:  98 F (36.7 C)    TempSrc:  Oral    SpO2:  100% 100% 93%    Final Clinical Impressions(s) / ED Diagnoses   Final diagnoses:  Closed displaced avulsion  fracture of tuberosity of left calcaneus, initial encounter  Rib  contusion, right, initial encounter    ED Discharge Orders    None       Jeannett Senior, Hershal Coria 11/15/17 2324    Milton Ferguson, MD 11/18/17 1312

## 2017-11-16 ENCOUNTER — Inpatient Hospital Stay (HOSPITAL_COMMUNITY): Payer: Medicare Other

## 2017-11-16 ENCOUNTER — Other Ambulatory Visit: Payer: Self-pay | Admitting: Orthopaedic Surgery

## 2017-11-16 LAB — COMPREHENSIVE METABOLIC PANEL
ALBUMIN: 3.7 g/dL (ref 3.5–5.0)
ALK PHOS: 62 U/L (ref 38–126)
ALT: 18 U/L (ref 14–54)
AST: 29 U/L (ref 15–41)
Anion gap: 8 (ref 5–15)
BUN: 21 mg/dL — AB (ref 6–20)
CALCIUM: 8.7 mg/dL — AB (ref 8.9–10.3)
CO2: 25 mmol/L (ref 22–32)
CREATININE: 0.97 mg/dL (ref 0.44–1.00)
Chloride: 102 mmol/L (ref 101–111)
GFR calc Af Amer: >60 mL/min (ref >60)
GFR calc non Af Amer: 54 mL/min — ABNORMAL LOW (ref >60)
GLUCOSE: 108 mg/dL — AB (ref 65–99)
Potassium: 4.4 mmol/L (ref 3.5–5.1)
SODIUM: 135 mmol/L (ref 135–145)
Total Bilirubin: 1.6 mg/dL — ABNORMAL HIGH (ref 0.3–1.2)
Total Protein: 6 g/dL — ABNORMAL LOW (ref 6.5–8.1)

## 2017-11-16 LAB — CBC
HCT: 34.2 % — ABNORMAL LOW (ref 36.0–46.0)
Hemoglobin: 11.3 g/dL — ABNORMAL LOW (ref 12.0–15.0)
MCH: 32.9 pg (ref 26.0–34.0)
MCHC: 33 g/dL (ref 30.0–36.0)
MCV: 99.7 fL (ref 78.0–100.0)
PLATELETS: 242 10*3/uL (ref 150–400)
RBC: 3.43 MIL/uL — ABNORMAL LOW (ref 3.87–5.11)
RDW: 14.3 % (ref 11.5–15.5)
WBC: 8.1 10*3/uL (ref 4.0–10.5)

## 2017-11-16 LAB — MAGNESIUM: MAGNESIUM: 1.8 mg/dL (ref 1.7–2.4)

## 2017-11-16 MED ORDER — KETOROLAC TROMETHAMINE 15 MG/ML IJ SOLN
15.0000 mg | Freq: Four times a day (QID) | INTRAMUSCULAR | Status: DC | PRN
  Administered 2017-11-16 – 2017-11-17 (×2): 15 mg via INTRAVENOUS
  Filled 2017-11-16 (×2): qty 1

## 2017-11-16 MED ORDER — ETANERCEPT 50 MG/ML ~~LOC~~ SOSY: 50.0000 mg | PREFILLED_SYRINGE | SUBCUTANEOUS | Status: DC

## 2017-11-16 MED ORDER — HYDROCODONE-ACETAMINOPHEN 5-325 MG PO TABS
1.0000 | ORAL_TABLET | Freq: Two times a day (BID) | ORAL | Status: DC | PRN
  Administered 2017-11-16: 1 via ORAL
  Filled 2017-11-16: qty 1

## 2017-11-16 MED ORDER — ENOXAPARIN SODIUM 40 MG/0.4ML ~~LOC~~ SOLN
40.0000 mg | SUBCUTANEOUS | Status: DC
  Administered 2017-11-16 – 2017-11-19 (×3): 40 mg via SUBCUTANEOUS
  Filled 2017-11-16 (×3): qty 0.4

## 2017-11-16 MED ORDER — ONDANSETRON HCL 4 MG/2ML IJ SOLN: 4.0000 mg | Freq: Four times a day (QID) | INTRAMUSCULAR | Status: DC | PRN

## 2017-11-16 MED ORDER — LEVOTHYROXINE SODIUM 50 MCG PO TABS
50.0000 ug | ORAL_TABLET | Freq: Every day | ORAL | Status: DC
  Administered 2017-11-16 – 2017-11-21 (×6): 50 ug via ORAL
  Filled 2017-11-16 (×6): qty 1

## 2017-11-16 MED ORDER — POLYETHYLENE GLYCOL 3350 17 G PO PACK: 17.0000 g | PACK | Freq: Every day | ORAL | Status: DC | PRN

## 2017-11-16 MED ORDER — IRBESARTAN 150 MG PO TABS
75.0000 mg | ORAL_TABLET | Freq: Every day | ORAL | Status: DC
  Administered 2017-11-16 – 2017-11-17 (×2): 75 mg via ORAL
  Filled 2017-11-16: qty 1
  Filled 2017-11-16: qty 0.5
  Filled 2017-11-16: qty 1

## 2017-11-16 MED ORDER — SODIUM CHLORIDE 0.45 % IV SOLN
INTRAVENOUS | Status: DC
  Administered 2017-11-16 – 2017-11-19 (×6): via INTRAVENOUS

## 2017-11-16 MED ORDER — DIAZEPAM 5 MG/ML IJ SOLN
5.0000 mg | Freq: Once | INTRAMUSCULAR | Status: AC
  Administered 2017-11-16: 5 mg via INTRAVENOUS
  Filled 2017-11-16: qty 2

## 2017-11-16 MED ORDER — ONDANSETRON HCL 4 MG PO TABS: 4.0000 mg | ORAL_TABLET | Freq: Four times a day (QID) | ORAL | Status: DC | PRN

## 2017-11-16 MED ORDER — METHOTREXATE 2.5 MG PO TABS: 20.0000 mg | ORAL_TABLET | ORAL | Status: DC

## 2017-11-16 MED ORDER — HYDROMORPHONE HCL 1 MG/ML IJ SOLN
1.0000 mg | INTRAMUSCULAR | Status: DC | PRN
  Administered 2017-11-16 (×3): 1 mg via INTRAVENOUS
  Filled 2017-11-16 (×3): qty 1

## 2017-11-16 MED ORDER — METHOCARBAMOL 500 MG PO TABS
500.0000 mg | ORAL_TABLET | Freq: Four times a day (QID) | ORAL | Status: DC | PRN
  Administered 2017-11-16 – 2017-11-21 (×6): 500 mg via ORAL
  Filled 2017-11-16 (×6): qty 1

## 2017-11-16 MED ORDER — FOLIC ACID 1 MG PO TABS
1.0000 mg | ORAL_TABLET | Freq: Every day | ORAL | Status: DC
  Administered 2017-11-16 – 2017-11-21 (×6): 1 mg via ORAL
  Filled 2017-11-16 (×6): qty 1

## 2017-11-16 MED ORDER — CALCIUM CARBONATE 1250 (500 CA) MG PO TABS
1250.0000 mg | ORAL_TABLET | Freq: Every day | ORAL | Status: DC
  Administered 2017-11-16 – 2017-11-21 (×6): 1250 mg via ORAL
  Filled 2017-11-16 (×6): qty 1

## 2017-11-16 MED ORDER — DIAZEPAM 5 MG/ML IJ SOLN
5.0000 mg | Freq: Once | INTRAMUSCULAR | Status: AC
  Administered 2017-11-16: 5 mg via INTRAVENOUS

## 2017-11-16 MED ORDER — HYDROMORPHONE HCL 2 MG/ML IJ SOLN
1.0000 mg | INTRAMUSCULAR | Status: DC | PRN
  Administered 2017-11-17 (×4): 1 mg via INTRAVENOUS
  Filled 2017-11-16 (×4): qty 1

## 2017-11-16 NOTE — Progress Notes (Signed)
PROGRESS NOTE    Jennifer Goodman  OZD:[id number] DOB: 1935-06-10 DOA: 11/15/2017 PCP: [place], Betty G, MD   Brief Narrative:  83 y.o. female with medical history significant of hypertension, hypothyroidism, osteoarthritis, osteoporosis,previousfalls with multiple orthopedic surgeries who presented to the year today after sustaining mechanical fall at home.    Assessment & Plan:   Principal Problem:   Achilles tendon avulsion -  Ortho consulted and plan is for CT for possible ORIF tomorrow. Current suspicion from orthopedic surgeon standpoint is entire Achilles attachment is involved -Otherwise continue pain controlled -Patient complaining of spasm will check magnesium levels  - Significantly displaced avulsion fracture at the posterosuperior calcaneus, reflecting avulsion of the Achilles tendon and Fractures through the base of the fifth metatarsal and base of the fifth proximal phalanx, with underlying lucency, suggesting these may be subacute in nature.   Active Problems:   Hypertension, essential, benign -  Pt is on avapro    Hypothyroidism (acquired) - stable on synthroid    CKD (chronic kidney disease), stage III (HCC) - stable   DVT prophylaxis: Lovenox Code Status: Full Family Communication: none at bedside. Disposition Plan: pending final recommendations from surgeon.   Consultants:   Ortho   Procedures: to be determined   Antimicrobials: none   Subjective: Pt has no new complaints reported other than spasms  Objective: Vitals:   11/16/17 0251 11/16/17 0633 11/16/17 1307 11/16/17 1544  BP:  130/63 131/63 (!) 153/69  Pulse:  62 70 61  Resp:  14 14   Temp:  97.7 F (36.5 C) 97.9 F (36.6 C)   TempSrc:  Oral Oral   SpO2: 94% 97% 100% 99%  Weight:      Height:        Intake/Output Summary (Last 24 hours) at 11/16/2017 1550 Last data filed at 11/16/2017 1422 Gross per 24 hour  Intake 752.5 ml  Output 475 ml  Net 277.5 ml   Filed Weights   11/16/17 0003  Weight: 70.8 kg (156 lb)    Examination:  General exam: Appears calm and comfortable, in nad. Respiratory system: Clear to auscultation. Respiratory effort normal. Equal chest rise.  Cardiovascular system: S1 & S2 heard, RRR. No JVD, murmurs Gastrointestinal system: Abdomen is nondistended, soft and nontender. No organomegaly or masses felt. Normal bowel sounds heard. Central nervous system: Alert and oriented. No focal neurological deficits. Extremities: Symmetric 5 x 5 power. Skin: No rashes, lesions or ulcers, on limited exam. Psychiatry:  Mood & affect appropriate.     Data Reviewed: I have personally reviewed following labs and imaging studies  CBC: Recent Labs  Lab 11/15/17 2114 11/16/17 0540  WBC 8.2 8.1  NEUTROABS 6.3  --   HGB 11.9* 11.3*  HCT 35.9* 34.2*  MCV 101.1* 99.7  PLT 248 259   Basic Metabolic Panel: Recent Labs  Lab 11/15/17 2114 11/16/17 0540  NA 136 135  K 4.1 4.4  CL 103 102  CO2 24 25  GLUCOSE 105* 108*  BUN 24* 21*  CREATININE 0.93 0.97  CALCIUM 9.0 8.7*   GFR: Estimated Creatinine Clearance: 44.6 mL/min (by C-G formula based on SCr of 0.97 mg/dL). Liver Function Tests: Recent Labs  Lab 11/16/17 0540  AST 29  ALT 18  ALKPHOS 62  BILITOT 1.6*  PROT 6.0*  ALBUMIN 3.7   No results for input(s): LIPASE, AMYLASE in the last 168 hours. No results for input(s): AMMONIA in the last 168 hours. Coagulation Profile: No results for input(s): INR, PROTIME in  the last 168 hours. Cardiac Enzymes: No results for input(s): CKTOTAL, CKMB, CKMBINDEX, TROPONINI in the last 168 hours. BNP (last 3 results) No results for input(s): PROBNP in the last 8760 hours. HbA1C: No results for input(s): HGBA1C in the last 72 hours. CBG: No results for input(s): GLUCAP in the last 168 hours. Lipid Profile: No results for input(s): CHOL, HDL, LDLCALC, TRIG, CHOLHDL, LDLDIRECT in the last 72 hours. Thyroid Function Tests: No results for  input(s): TSH, T4TOTAL, FREET4, T3FREE, THYROIDAB in the last 72 hours. Anemia Panel: No results for input(s): VITAMINB12, FOLATE, FERRITIN, TIBC, IRON, RETICCTPCT in the last 72 hours. Sepsis Labs: No results for input(s): PROCALCITON, LATICACIDVEN in the last 168 hours.  No results found for this or any previous visit (from the past 240 hour(s)).       Radiology Studies: Dg Ribs Unilateral W/chest Right  Result Date: 11/15/2017 CLINICAL DATA:  Fall today coming in from garage.  Right rib pain. EXAM: RIGHT RIBS AND CHEST - 3+ VIEW COMPARISON:  Chest radiograph 10/10/2017 FINDINGS: No fracture or other bone lesions are seen involving the right ribs. Remote left rib fracture, as seen on prior exam. There is no evidence of pneumothorax or pleural effusion. Calcified granuloma in the right lung. Heart size and mediastinal contours are within normal limits. IMPRESSION: Negative radiographs of the chest and right ribs. Electronically Signed   By: Jeb Levering M.D.   On: 11/15/2017 21:55   Dg Lumbar Spine Complete  Result Date: 11/15/2017 CLINICAL DATA:  Fall coming in from garage, lumbosacral back pain. EXAM: LUMBAR SPINE - COMPLETE 4+ VIEW COMPARISON:  Lumbar spine MRI 09/06/2017 FINDINGS: Moderate levo scoliotic curvature of the lumbar spine. Anterolisthesis of L4 on L5 retrolisthesis of L2 on L3 appears degenerative. Remote L1 compression fracture. Remaining lumbar vertebral body heights are preserved. Diffuse disc space narrowing and endplate spurring and facet arthropathy. The bones are under mineralized. Aorto bi-iliac atherosclerosis is incidentally noted. IMPRESSION: 1. No fracture or acute osseous abnormality of the lumbar spine. 2. Scoliosis and multilevel degenerative change. Remote L1 compression fracture. Electronically Signed   By: Jeb Levering M.D.   On: 11/15/2017 21:57   Dg Pelvis 1-2 Views  Result Date: 11/15/2017 CLINICAL DATA:  Fall coming in from garage tonight. EXAM:  PELVIS - 1-2 VIEW COMPARISON:  Abdominopelvic CT 05/05/2015 FINDINGS: Remote right pubic body fracture, pubic symphysis is congruent. No acute fracture. Both femoral heads are well seated in the respective acetabula. Sacroiliac joints are congruent. IMPRESSION: 1. No acute pelvic fracture. 2. Remote right pubic body fracture. Electronically Signed   By: Jeb Levering M.D.   On: 11/15/2017 21:58   Dg Ankle Complete Left  Result Date: 11/15/2017 CLINICAL DATA:  Fall, in from garage.  Left foot and ankle pain. EXAM: LEFT ANKLE COMPLETE - 3+ VIEW COMPARISON:  None. FINDINGS: Displaced avulsion fracture at the posterior calcaneus at the Achilles tendon insertion. Displaced fracture fragment measures 2.4 cm, there is approximately 2.7 cm distraction posteriorly. Apex dorsal angulation. Base of the fifth metatarsal fracture better assessed on concurrent foot radiographs. No additional fracture of the ankle. Prominent plantar calcaneal spur. The ankle mortise is preserved. Diffuse bony under mineralization. IMPRESSION: 1. Displaced posterior calcaneal fracture at the Achilles insertion with 2.7 cm osseous distraction. 2. Base of the fifth metatarsal fracture, better assessed on concurrent foot radiographs. Electronically Signed   By: Jeb Levering M.D.   On: 11/15/2017 22:05   Ct Ankle Left Wo Contrast  Result Date: 11/16/2017 CLINICAL  DATA:  Calcaneus fracture. EXAM: CT OF THE LEFT ANKLE WITHOUT CONTRAST TECHNIQUE: Multidetector CT imaging of the left ankle was performed according to the standard protocol. Multiplanar CT image reconstructions were also generated. COMPARISON:  Radiographs dated 11/15/2017 FINDINGS: Bones/Joint/Cartilage There is a displaced rotated fracture of the posterosuperior aspect of the calcaneus at the Achilles insertion. There is a fracture through the base of the fifth metatarsal with slight distraction. Marked diffuse osteopenia. Muscles and Tendons No discrete abnormality except for  proximal retraction of the Achilles tendon due to the avulsion fracture of the posterior calcaneus. No discrete abnormality of the other tendons around the ankle. Soft tissues Multiple small calcifications in the subcutaneous soft tissues of lower leg. Slight soft tissue edema around the ankle and foot. Chronic calcification in the plantar fascia. IMPRESSION: Displaced rotated avulsion fracture of the posterosuperior aspect of the calcaneus at the Achilles insertion. Slightly distracted fracture of the base of the fifth metatarsal. Marked diffuse osteopenia. Electronically Signed   By: Lorriane Shire M.D.   On: 11/16/2017 14:07   Dg Foot Complete Left  Result Date: 11/15/2017 CLINICAL DATA:  Status post fall, with bilateral foot pain. Initial encounter. EXAM: LEFT FOOT - COMPLETE 3+ VIEW COMPARISON:  None. FINDINGS: There is a significantly displaced avulsion fracture at the posterosuperior calcaneus, reflecting avulsion of the Achilles tendon. There appear to be fractures through the base of the fifth metatarsal and base of the fifth proximal phalanx, with underlying lucency, suggesting these may be subacute in nature. No additional fractures are seen. There is mild osteopenia of visualized osseous structures. An os peroneum is noted. A plantar calcaneal spur is noted. IMPRESSION: 1. Significantly displaced avulsion fracture at the posterosuperior calcaneus, reflecting avulsion of the Achilles tendon. 2. Fractures through the base of the fifth metatarsal and base of the fifth proximal phalanx, with underlying lucency, suggesting these may be subacute in nature. 3. Mild osteopenia of visualized osseous structures. 4. Os peroneum noted. Electronically Signed   By: Garald Balding M.D.   On: 11/15/2017 23:01   Dg Foot Complete Right  Result Date: 11/15/2017 CLINICAL DATA:  Fall coming in from garage tonight. Right foot pain. EXAM: RIGHT FOOT COMPLETE - 3+ VIEW COMPARISON:  None. FINDINGS: Fracture at the base of  the fifth metatarsal has sclerotic margins and is likely chronic. Remote fractures of the first, second, and third metatarsal shafts. No acute fracture. Bones significantly under mineralized. Small plantar calcaneal spur and Achilles tendon enthesophyte. IMPRESSION: 1. Fracture at the base of the fifth metatarsal has sclerotic margins and is likely chronic, recommend correlation with point tenderness. 2. Remote first, second, and third metatarsal shaft fractures. Electronically Signed   By: Jeb Levering M.D.   On: 11/15/2017 22:59     Scheduled Meds: . calcium carbonate  1,250 mg Oral Daily  . enoxaparin (LOVENOX) injection  40 mg Subcutaneous Q24H  . folic acid  1 mg Oral Daily  . irbesartan  75 mg Oral Daily  . levothyroxine  50 mcg Oral QAC breakfast   Continuous Infusions: . sodium chloride 75 mL/hr at 11/16/17 1538     LOS: 1 day    Time spent: 35 min    Velvet Bathe, MD [facility] Pager [phone]  If 7PM-7AM, please contact night-coverage [email or web address] Password [email or web address] 11/16/2017, 3:50 PM

## 2017-11-16 NOTE — H&P (View-Only) (Signed)
Melrose Nakayama, MD  Chauncey Cruel, PA-C  Loni Dolly, PA-C                                  [facility]                [address] [place], [place]  [address]   ORTHOPAEDIC CONSULTATION  Jennifer Goodman            MRN:  [id number] DOB/SEX:  Dec 21, 1934/female     CHIEF COMPLAINT:  Painful left heel and ribs.  HISTORY: Jennifer Kugler Hodgesis a 83 y.o. female with history of fall at home yesterday.  No LOC.  C/o back greater than heel pain.  Very active person.  Doesn't use walking appliances and lives independently with husband. History of ORS by Dr Omer Jack, Apolonio Schneiders.   PAST MEDICAL HISTORY: Patient Active Problem List   Diagnosis Date Noted  . Fall 11/15/2017  . Displaced fracture of fifth metatarsal bone, left foot, initial encounter for closed fracture 11/15/2017  . Achilles tendon avulsion 11/15/2017  . Fracture, foot, left, closed, initial encounter 11/15/2017  . Hypertension, essential, benign 08/24/2017  . Hypothyroidism (acquired) 08/24/2017  . CKD (chronic kidney disease), stage III ([place]) 08/24/2017  . Infection of urinary tract 05/29/2016  . Primary osteoarthritis of right knee 05/28/2016  . Primary osteoarthritis of left knee 06/20/2015  . Osteoporosis 06/20/2015  . Closed displaced fracture of medial condyle of left femur ([place]) 06/20/2015  . Abdominal pain, RLQ (right lower quadrant) 05/19/2012   Past Medical History:  Diagnosis Date  . Anemia   . Arthritis    Rhuematoid-takes Enbrel and Methorexate   . Back pain    states in Jan 2017 spinal was attempted 4 times and has had back pain since.  . Cancer ([place])    skin cancer on head  . Chicken pox   . Diverticulosis   . History of colon polyps    benign  . Hypertension    takes Avapro daily  . Hypothyroidism    takes Synthroid daily  . Joint pain   . Pneumonia    at age 68  . PONV (postoperative nausea and vomiting)    the patch helps!  . Urinary frequency   . Urinary urgency    was  taking Myrbetriq but stopped.Plans to start back   Past Surgical History:  Procedure Laterality Date  . CHOLECYSTECTOMY  2000  . COLONOSCOPY    . EYE SURGERY Left    tear duct surgery  . HAND TENDON SURGERY     Dr. Amedeo Plenty  . KNEE ARTHROSCOPY    . ORIF ELBOW FRACTURE Right 02/25/2017   Procedure: OPEN REDUCTION INTERNAL FIXATION (ORIF) RIGHT PROXIMAL OLECRANON FRACTURE;  Surgeon: Iran Planas, MD;  Location: [facility];  Service: Orthopedics;  Laterality: Right;  . TOTAL KNEE ARTHROPLASTY Left 06/20/2015  . TOTAL KNEE ARTHROPLASTY Left 06/20/2015   Procedure: TOTAL KNEE ARTHROPLASTY;  Surgeon: Dorna Leitz, MD;  Location: [facility];  Service: Orthopedics;  Laterality: Left;  . TOTAL KNEE ARTHROPLASTY Right 05/28/2016   Procedure: TOTAL KNEE ARTHROPLASTY;  Surgeon: Dorna Leitz, MD;  Location: [facility];  Service: Orthopedics;  Laterality: Right;     MEDICATIONS:   Current Facility-Administered Medications:  .  0.45 % sodium chloride infusion, , Intravenous, Continuous, Garba, Mohammad L, MD, Last Rate: 75 mL/hr at 11/16/17 0046 .  calcium carbonate (OS-CAL - dosed in mg of elemental  calcium) tablet 1,250 mg, 1,250 mg, Oral, Daily, Garba, Mohammad L, MD .  enoxaparin (LOVENOX) injection 40 mg, 40 mg, Subcutaneous, Q24H, Garba, Mohammad L, MD .  etanercept (ENBREL) 50 MG/ML injection 50 mg, 50 mg, Subcutaneous, Weekly, Garba, Mohammad L, MD .  folic acid (FOLVITE) tablet 1 mg, 1 mg, Oral, Daily, Garba, Mohammad L, MD .  HYDROcodone-acetaminophen (NORCO/VICODIN) 5-325 MG per tablet 1 tablet, 1 tablet, Oral, BID PRN, Jonelle Sidle, Mohammad L, MD .  HYDROmorphone (DILAUDID) injection 1 mg, 1 mg, Intravenous, Q3H PRN, Elwyn Reach, MD, 1 mg at 11/16/17 0339 .  irbesartan (AVAPRO) tablet 75 mg, 75 mg, Oral, Daily, Garba, Mohammad L, MD .  ketorolac (TORADOL) 15 MG/ML injection 15 mg, 15 mg, Intravenous, Q6H PRN, Gala Romney L, MD, 15 mg at 11/16/17 0545 .  levothyroxine (SYNTHROID, LEVOTHROID) tablet 50 mcg,  50 mcg, Oral, QAC breakfast, Elwyn Reach, MD .  Derrill Memo ON 11/21/2017] methotrexate (RHEUMATREX) tablet 20 mg, 20 mg, Oral, Q Mon, Garba, Mohammad L, MD .  ondansetron (ZOFRAN) tablet 4 mg, 4 mg, Oral, Q6H PRN **OR** ondansetron (ZOFRAN) injection 4 mg, 4 mg, Intravenous, Q6H PRN, Garba, Mohammad L, MD .  polyethylene glycol (MIRALAX / GLYCOLAX) packet 17 g, 17 g, Oral, Daily PRN, Elwyn Reach, MD  ALLERGIES:   Allergies  Allergen Reactions  . Oxycodone Other (See Comments)    Makes hallucinates  . Sulfamethoxazole Nausea Only    REVIEW OF SYSTEMS: REVIEWED IN DETAIL IN CHART  FAMILY HISTORY:   Family History  Problem Relation Age of Onset  . Cancer Father        colon ca/ lung ca  . Heart disease Father     SOCIAL HISTORY:   Social History   Tobacco Use  . Smoking status: Former Smoker    Last attempt to quit: 06/14/1978    Years since quitting: 39.4  . Smokeless tobacco: Never Used  . Tobacco comment: quit 32 yrs ago  Substance Use Topics  . Alcohol use: Yes    Comment: occ     EXAMINATION: Vital signs in last 24 hours: Temp:  [97.7 F (36.5 C)-98 F (36.7 C)] 97.7 F (36.5 C) (06/05 2952) Pulse Rate:  [62-80] 62 (06/05 0633) Resp:  [14-21] 14 (06/05 0633) BP: (120-160)/(63-84) 130/63 (06/05 0633) SpO2:  [93 %-100 %] 97 % (06/05 0633) Weight:  [70.8 kg (156 lb)] 70.8 kg (156 lb) (06/05 0003)  BP 130/63 (BP Location: Left Arm)   Pulse 62   Temp 97.7 F (36.5 C) (Oral)   Resp 14   Ht 5\' 4"  (1.626 m)   Wt 70.8 kg (156 lb)   SpO2 97%   BMI 26.78 kg/m   General Appearance:    Alert, cooperative, no distress, appears stated age  Head:    Normocephalic, without obvious abnormality, atraumatic  Eyes:    PERRL, conjunctiva/corneas clear, EOM's intact, fundi    benign, both eyes  Ears:    Normal TM's and external ear canals, both ears  Nose:   Nares normal, septum midline, mucosa normal, no drainage    or sinus tenderness  Throat:   Lips, mucosa, and  tongue normal; teeth and gums normal  Neck:   Supple, symmetrical, trachea midline, no adenopathy;    thyroid:  no enlargement/tenderness/nodules; no carotid   bruit or JVD  Back:     Symmetric, no curvature, ROM normal, tender right lower back but no bruising  Lungs:     Clear to auscultation  bilaterally, respirations unlabored  Chest Wall:    No tenderness or deformity   Heart:    Regular rate and rhythm, S1 and S2 normal, no murmur, rub   or gallop  Breast Exam:    No tenderness, masses, or nipple abnormality  Abdomen:     Soft, non-tender, bowel sounds active all four quadrants,    no masses, no organomegaly  Genitalia:    Rectal:    Extremities:   Well fitting splint left leg  Pulses:   2+ and symmetric all extremities  Skin:   Skin color, texture, turgor normal, no rashes or lesions  Lymph nodes:   Cervical, supraclavicular, and axillary nodes normal  Neurologic:   CNII-XII intact, normal strength, sensation and reflexes    throughout    Musculoskeletal Exam:   CMS intact in left foot. Good painless ROM other 3 ext   DIAGNOSTIC STUDIES: Recent laboratory studies: Recent Labs    11/15/17 September 16, 2112 11/16/17 0540  WBC 8.2 8.1  HGB 11.9* 11.3*  HCT 35.9* 34.2*  PLT 248 242   Recent Labs    11/15/17 09-16-2112 11/16/17 0540  NA 136 135  K 4.1 4.4  CL 103 102  CO2 24 25  BUN 24* 21*  CREATININE 0.93 0.97  GLUCOSE 105* 108*  CALCIUM 9.0 8.7*   Lab Results  Component Value Date   INR 0.99 05/18/2016   INR 1.04 06/10/2015     Recent Radiographic Studies :  Dg Ribs Unilateral W/chest Right  Result Date: 11/15/2017 CLINICAL DATA:  Fall today coming in from garage.  Right rib pain. EXAM: RIGHT RIBS AND CHEST - 3+ VIEW COMPARISON:  Chest radiograph 10/10/2017 FINDINGS: No fracture or other bone lesions are seen involving the right ribs. Remote left rib fracture, as seen on prior exam. There is no evidence of pneumothorax or pleural effusion. Calcified granuloma in the right lung.  Heart size and mediastinal contours are within normal limits. IMPRESSION: Negative radiographs of the chest and right ribs. Electronically Signed   By: Jeb Levering M.D.   On: 11/15/2017 21:55   Dg Lumbar Spine Complete  Result Date: 11/15/2017 CLINICAL DATA:  Fall coming in from garage, lumbosacral back pain. EXAM: LUMBAR SPINE - COMPLETE 4+ VIEW COMPARISON:  Lumbar spine MRI 09/06/2017 FINDINGS: Moderate levo scoliotic curvature of the lumbar spine. Anterolisthesis of L4 on L5 retrolisthesis of L2 on L3 appears degenerative. Remote L1 compression fracture. Remaining lumbar vertebral body heights are preserved. Diffuse disc space narrowing and endplate spurring and facet arthropathy. The bones are under mineralized. Aorto bi-iliac atherosclerosis is incidentally noted. IMPRESSION: 1. No fracture or acute osseous abnormality of the lumbar spine. 2. Scoliosis and multilevel degenerative change. Remote L1 compression fracture. Electronically Signed   By: Jeb Levering M.D.   On: 11/15/2017 21:57   Dg Pelvis 1-2 Views  Result Date: 11/15/2017 CLINICAL DATA:  Fall coming in from garage tonight. EXAM: PELVIS - 1-2 VIEW COMPARISON:  Abdominopelvic CT 05/05/2015 FINDINGS: Remote right pubic body fracture, pubic symphysis is congruent. No acute fracture. Both femoral heads are well seated in the respective acetabula. Sacroiliac joints are congruent. IMPRESSION: 1. No acute pelvic fracture. 2. Remote right pubic body fracture. Electronically Signed   By: Jeb Levering M.D.   On: 11/15/2017 21:58   Dg Ankle Complete Left  Result Date: 11/15/2017 CLINICAL DATA:  Fall, in from garage.  Left foot and ankle pain. EXAM: LEFT ANKLE COMPLETE - 3+ VIEW COMPARISON:  None. FINDINGS: Displaced avulsion fracture at  the posterior calcaneus at the Achilles tendon insertion. Displaced fracture fragment measures 2.4 cm, there is approximately 2.7 cm distraction posteriorly. Apex dorsal angulation. Base of the fifth  metatarsal fracture better assessed on concurrent foot radiographs. No additional fracture of the ankle. Prominent plantar calcaneal spur. The ankle mortise is preserved. Diffuse bony under mineralization. IMPRESSION: 1. Displaced posterior calcaneal fracture at the Achilles insertion with 2.7 cm osseous distraction. 2. Base of the fifth metatarsal fracture, better assessed on concurrent foot radiographs. Electronically Signed   By: Jeb Levering M.D.   On: 11/15/2017 22:05   Dg Foot Complete Left  Result Date: 11/15/2017 CLINICAL DATA:  Status post fall, with bilateral foot pain. Initial encounter. EXAM: LEFT FOOT - COMPLETE 3+ VIEW COMPARISON:  None. FINDINGS: There is a significantly displaced avulsion fracture at the posterosuperior calcaneus, reflecting avulsion of the Achilles tendon. There appear to be fractures through the base of the fifth metatarsal and base of the fifth proximal phalanx, with underlying lucency, suggesting these may be subacute in nature. No additional fractures are seen. There is mild osteopenia of visualized osseous structures. An os peroneum is noted. A plantar calcaneal spur is noted. IMPRESSION: 1. Significantly displaced avulsion fracture at the posterosuperior calcaneus, reflecting avulsion of the Achilles tendon. 2. Fractures through the base of the fifth metatarsal and base of the fifth proximal phalanx, with underlying lucency, suggesting these may be subacute in nature. 3. Mild osteopenia of visualized osseous structures. 4. Os peroneum noted. Electronically Signed   By: Garald Balding M.D.   On: 11/15/2017 23:01   Dg Foot Complete Right  Result Date: 11/15/2017 CLINICAL DATA:  Fall coming in from garage tonight. Right foot pain. EXAM: RIGHT FOOT COMPLETE - 3+ VIEW COMPARISON:  None. FINDINGS: Fracture at the base of the fifth metatarsal has sclerotic margins and is likely chronic. Remote fractures of the first, second, and third metatarsal shafts. No acute fracture.  Bones significantly under mineralized. Small plantar calcaneal spur and Achilles tendon enthesophyte. IMPRESSION: 1. Fracture at the base of the fifth metatarsal has sclerotic margins and is likely chronic, recommend correlation with point tenderness. 2. Remote first, second, and third metatarsal shaft fractures. Electronically Signed   By: Jeb Levering M.D.   On: 11/15/2017 22:59    ASSESSMENT:  Left displaced calcaneal fracture   PLAN:  Plan CT today for further evaluation with probable ORIF tomorrow.  Suspect entire achilles attachment is involved.  Ariannie Penaloza G 11/16/2017, 8:21 AM

## 2017-11-16 NOTE — Progress Notes (Deleted)
1 mg of IV Valium wasted in the sink after being sent up straight from pharmacy. Carma Leaven, RN was a witness.

## 2017-11-16 NOTE — Progress Notes (Signed)
5mg  of IV Valium was wasted in the sink after being sent up from pharmacy. Carma Leaven, RN was a witness.

## 2017-11-16 NOTE — Progress Notes (Signed)
Patient transferred to [facility] 5N/07/C.

## 2017-11-16 NOTE — Plan of Care (Signed)
Reviewed plan of care, specifically pain control measures, safety precautions, and importance of notifying staff with any questions or concerns. Pt verbalized understanding of all education.

## 2017-11-16 NOTE — Consult Note (Signed)
Melrose Nakayama, MD  Chauncey Cruel, PA-C  Loni Dolly, PA-C                                  [facility]                [address] [place], [place]  [address]   ORTHOPAEDIC CONSULTATION  Jennifer Goodman            MRN:  [id number] DOB/SEX:  1934-10-10/female     CHIEF COMPLAINT:  Painful left heel and ribs.  HISTORY: Jennifer Goodman a 83 y.o. female with history of fall at home yesterday.  No LOC.  C/o back greater than heel pain.  Very active person.  Doesn't use walking appliances and lives independently with husband. History of ORS by Dr Omer Jack, Apolonio Schneiders.   PAST MEDICAL HISTORY: Patient Active Problem List   Diagnosis Date Noted  . Fall 11/15/2017  . Displaced fracture of fifth metatarsal bone, left foot, initial encounter for closed fracture 11/15/2017  . Achilles tendon avulsion 11/15/2017  . Fracture, foot, left, closed, initial encounter 11/15/2017  . Hypertension, essential, benign 08/24/2017  . Hypothyroidism (acquired) 08/24/2017  . CKD (chronic kidney disease), stage III ([place]) 08/24/2017  . Infection of urinary tract 05/29/2016  . Primary osteoarthritis of right knee 05/28/2016  . Primary osteoarthritis of left knee 06/20/2015  . Osteoporosis 06/20/2015  . Closed displaced fracture of medial condyle of left femur ([place]) 06/20/2015  . Abdominal pain, RLQ (right lower quadrant) 05/19/2012   Past Medical History:  Diagnosis Date  . Anemia   . Arthritis    Rhuematoid-takes Enbrel and Methorexate   . Back pain    states in Jan 2017 spinal was attempted 4 times and has had back pain since.  . Cancer ([place])    skin cancer on head  . Chicken pox   . Diverticulosis   . History of colon polyps    benign  . Hypertension    takes Avapro daily  . Hypothyroidism    takes Synthroid daily  . Joint pain   . Pneumonia    at age 12  . PONV (postoperative nausea and vomiting)    the patch helps!  . Urinary frequency   . Urinary urgency    was  taking Myrbetriq but stopped.Plans to start back   Past Surgical History:  Procedure Laterality Date  . CHOLECYSTECTOMY  2000  . COLONOSCOPY    . EYE SURGERY Left    tear duct surgery  . HAND TENDON SURGERY     Dr. Amedeo Plenty  . KNEE ARTHROSCOPY    . ORIF ELBOW FRACTURE Right 02/25/2017   Procedure: OPEN REDUCTION INTERNAL FIXATION (ORIF) RIGHT PROXIMAL OLECRANON FRACTURE;  Surgeon: Iran Planas, MD;  Location: [facility];  Service: Orthopedics;  Laterality: Right;  . TOTAL KNEE ARTHROPLASTY Left 06/20/2015  . TOTAL KNEE ARTHROPLASTY Left 06/20/2015   Procedure: TOTAL KNEE ARTHROPLASTY;  Surgeon: Dorna Leitz, MD;  Location: [facility];  Service: Orthopedics;  Laterality: Left;  . TOTAL KNEE ARTHROPLASTY Right 05/28/2016   Procedure: TOTAL KNEE ARTHROPLASTY;  Surgeon: Dorna Leitz, MD;  Location: [facility];  Service: Orthopedics;  Laterality: Right;     MEDICATIONS:   Current Facility-Administered Medications:  .  0.45 % sodium chloride infusion, , Intravenous, Continuous, Garba, Mohammad L, MD, Last Rate: 75 mL/hr at 11/16/17 0046 .  calcium carbonate (OS-CAL - dosed in mg of elemental  calcium) tablet 1,250 mg, 1,250 mg, Oral, Daily, Garba, Mohammad L, MD .  enoxaparin (LOVENOX) injection 40 mg, 40 mg, Subcutaneous, Q24H, Garba, Mohammad L, MD .  etanercept (ENBREL) 50 MG/ML injection 50 mg, 50 mg, Subcutaneous, Weekly, Garba, Mohammad L, MD .  folic acid (FOLVITE) tablet 1 mg, 1 mg, Oral, Daily, Garba, Mohammad L, MD .  HYDROcodone-acetaminophen (NORCO/VICODIN) 5-325 MG per tablet 1 tablet, 1 tablet, Oral, BID PRN, Jonelle Sidle, Mohammad L, MD .  HYDROmorphone (DILAUDID) injection 1 mg, 1 mg, Intravenous, Q3H PRN, Elwyn Reach, MD, 1 mg at 11/16/17 0339 .  irbesartan (AVAPRO) tablet 75 mg, 75 mg, Oral, Daily, Garba, Mohammad L, MD .  ketorolac (TORADOL) 15 MG/ML injection 15 mg, 15 mg, Intravenous, Q6H PRN, Gala Romney L, MD, 15 mg at 11/16/17 0545 .  levothyroxine (SYNTHROID, LEVOTHROID) tablet 50 mcg,  50 mcg, Oral, QAC breakfast, Elwyn Reach, MD .  Derrill Memo ON 11/21/2017] methotrexate (RHEUMATREX) tablet 20 mg, 20 mg, Oral, Q Mon, Garba, Mohammad L, MD .  ondansetron (ZOFRAN) tablet 4 mg, 4 mg, Oral, Q6H PRN **OR** ondansetron (ZOFRAN) injection 4 mg, 4 mg, Intravenous, Q6H PRN, Garba, Mohammad L, MD .  polyethylene glycol (MIRALAX / GLYCOLAX) packet 17 g, 17 g, Oral, Daily PRN, Elwyn Reach, MD  ALLERGIES:   Allergies  Allergen Reactions  . Oxycodone Other (See Comments)    Makes hallucinates  . Sulfamethoxazole Nausea Only    REVIEW OF SYSTEMS: REVIEWED IN DETAIL IN CHART  FAMILY HISTORY:   Family History  Problem Relation Age of Onset  . Cancer Father        colon ca/ lung ca  . Heart disease Father     SOCIAL HISTORY:   Social History   Tobacco Use  . Smoking status: Former Smoker    Last attempt to quit: 06/14/1978    Years since quitting: 39.4  . Smokeless tobacco: Never Used  . Tobacco comment: quit 32 yrs ago  Substance Use Topics  . Alcohol use: Yes    Comment: occ     EXAMINATION: Vital signs in last 24 hours: Temp:  [97.7 F (36.5 C)-98 F (36.7 C)] 97.7 F (36.5 C) (06/05 9323) Pulse Rate:  [62-80] 62 (06/05 0633) Resp:  [14-21] 14 (06/05 0633) BP: (120-160)/(63-84) 130/63 (06/05 0633) SpO2:  [93 %-100 %] 97 % (06/05 0633) Weight:  [70.8 kg (156 lb)] 70.8 kg (156 lb) (06/05 0003)  BP 130/63 (BP Location: Left Arm)   Pulse 62   Temp 97.7 F (36.5 C) (Oral)   Resp 14   Ht 5\' 4"  (1.626 m)   Wt 70.8 kg (156 lb)   SpO2 97%   BMI 26.78 kg/m   General Appearance:    Alert, cooperative, no distress, appears stated age  Head:    Normocephalic, without obvious abnormality, atraumatic  Eyes:    PERRL, conjunctiva/corneas clear, EOM's intact, fundi    benign, both eyes  Ears:    Normal TM's and external ear canals, both ears  Nose:   Nares normal, septum midline, mucosa normal, no drainage    or sinus tenderness  Throat:   Lips, mucosa, and  tongue normal; teeth and gums normal  Neck:   Supple, symmetrical, trachea midline, no adenopathy;    thyroid:  no enlargement/tenderness/nodules; no carotid   bruit or JVD  Back:     Symmetric, no curvature, ROM normal, tender right lower back but no bruising  Lungs:     Clear to auscultation  bilaterally, respirations unlabored  Chest Wall:    No tenderness or deformity   Heart:    Regular rate and rhythm, S1 and S2 normal, no murmur, rub   or gallop  Breast Exam:    No tenderness, masses, or nipple abnormality  Abdomen:     Soft, non-tender, bowel sounds active all four quadrants,    no masses, no organomegaly  Genitalia:    Rectal:    Extremities:   Well fitting splint left leg  Pulses:   2+ and symmetric all extremities  Skin:   Skin color, texture, turgor normal, no rashes or lesions  Lymph nodes:   Cervical, supraclavicular, and axillary nodes normal  Neurologic:   CNII-XII intact, normal strength, sensation and reflexes    throughout    Musculoskeletal Exam:   CMS intact in left foot. Good painless ROM other 3 ext   DIAGNOSTIC STUDIES: Recent laboratory studies: Recent Labs    11/15/17 2112-09-13 11/16/17 0540  WBC 8.2 8.1  HGB 11.9* 11.3*  HCT 35.9* 34.2*  PLT 248 242   Recent Labs    11/15/17 13-Sep-2112 11/16/17 0540  NA 136 135  K 4.1 4.4  CL 103 102  CO2 24 25  BUN 24* 21*  CREATININE 0.93 0.97  GLUCOSE 105* 108*  CALCIUM 9.0 8.7*   Lab Results  Component Value Date   INR 0.99 05/18/2016   INR 1.04 06/10/2015     Recent Radiographic Studies :  Dg Ribs Unilateral W/chest Right  Result Date: 11/15/2017 CLINICAL DATA:  Fall today coming in from garage.  Right rib pain. EXAM: RIGHT RIBS AND CHEST - 3+ VIEW COMPARISON:  Chest radiograph 10/10/2017 FINDINGS: No fracture or other bone lesions are seen involving the right ribs. Remote left rib fracture, as seen on prior exam. There is no evidence of pneumothorax or pleural effusion. Calcified granuloma in the right lung.  Heart size and mediastinal contours are within normal limits. IMPRESSION: Negative radiographs of the chest and right ribs. Electronically Signed   By: Jeb Levering M.D.   On: 11/15/2017 21:55   Dg Lumbar Spine Complete  Result Date: 11/15/2017 CLINICAL DATA:  Fall coming in from garage, lumbosacral back pain. EXAM: LUMBAR SPINE - COMPLETE 4+ VIEW COMPARISON:  Lumbar spine MRI 09/06/2017 FINDINGS: Moderate levo scoliotic curvature of the lumbar spine. Anterolisthesis of L4 on L5 retrolisthesis of L2 on L3 appears degenerative. Remote L1 compression fracture. Remaining lumbar vertebral body heights are preserved. Diffuse disc space narrowing and endplate spurring and facet arthropathy. The bones are under mineralized. Aorto bi-iliac atherosclerosis is incidentally noted. IMPRESSION: 1. No fracture or acute osseous abnormality of the lumbar spine. 2. Scoliosis and multilevel degenerative change. Remote L1 compression fracture. Electronically Signed   By: Jeb Levering M.D.   On: 11/15/2017 21:57   Dg Pelvis 1-2 Views  Result Date: 11/15/2017 CLINICAL DATA:  Fall coming in from garage tonight. EXAM: PELVIS - 1-2 VIEW COMPARISON:  Abdominopelvic CT 05/05/2015 FINDINGS: Remote right pubic body fracture, pubic symphysis is congruent. No acute fracture. Both femoral heads are well seated in the respective acetabula. Sacroiliac joints are congruent. IMPRESSION: 1. No acute pelvic fracture. 2. Remote right pubic body fracture. Electronically Signed   By: Jeb Levering M.D.   On: 11/15/2017 21:58   Dg Ankle Complete Left  Result Date: 11/15/2017 CLINICAL DATA:  Fall, in from garage.  Left foot and ankle pain. EXAM: LEFT ANKLE COMPLETE - 3+ VIEW COMPARISON:  None. FINDINGS: Displaced avulsion fracture at  the posterior calcaneus at the Achilles tendon insertion. Displaced fracture fragment measures 2.4 cm, there is approximately 2.7 cm distraction posteriorly. Apex dorsal angulation. Base of the fifth  metatarsal fracture better assessed on concurrent foot radiographs. No additional fracture of the ankle. Prominent plantar calcaneal spur. The ankle mortise is preserved. Diffuse bony under mineralization. IMPRESSION: 1. Displaced posterior calcaneal fracture at the Achilles insertion with 2.7 cm osseous distraction. 2. Base of the fifth metatarsal fracture, better assessed on concurrent foot radiographs. Electronically Signed   By: Jeb Levering M.D.   On: 11/15/2017 22:05   Dg Foot Complete Left  Result Date: 11/15/2017 CLINICAL DATA:  Status post fall, with bilateral foot pain. Initial encounter. EXAM: LEFT FOOT - COMPLETE 3+ VIEW COMPARISON:  None. FINDINGS: There is a significantly displaced avulsion fracture at the posterosuperior calcaneus, reflecting avulsion of the Achilles tendon. There appear to be fractures through the base of the fifth metatarsal and base of the fifth proximal phalanx, with underlying lucency, suggesting these may be subacute in nature. No additional fractures are seen. There is mild osteopenia of visualized osseous structures. An os peroneum is noted. A plantar calcaneal spur is noted. IMPRESSION: 1. Significantly displaced avulsion fracture at the posterosuperior calcaneus, reflecting avulsion of the Achilles tendon. 2. Fractures through the base of the fifth metatarsal and base of the fifth proximal phalanx, with underlying lucency, suggesting these may be subacute in nature. 3. Mild osteopenia of visualized osseous structures. 4. Os peroneum noted. Electronically Signed   By: Garald Balding M.D.   On: 11/15/2017 23:01   Dg Foot Complete Right  Result Date: 11/15/2017 CLINICAL DATA:  Fall coming in from garage tonight. Right foot pain. EXAM: RIGHT FOOT COMPLETE - 3+ VIEW COMPARISON:  None. FINDINGS: Fracture at the base of the fifth metatarsal has sclerotic margins and is likely chronic. Remote fractures of the first, second, and third metatarsal shafts. No acute fracture.  Bones significantly under mineralized. Small plantar calcaneal spur and Achilles tendon enthesophyte. IMPRESSION: 1. Fracture at the base of the fifth metatarsal has sclerotic margins and is likely chronic, recommend correlation with point tenderness. 2. Remote first, second, and third metatarsal shaft fractures. Electronically Signed   By: Jeb Levering M.D.   On: 11/15/2017 22:59    ASSESSMENT:  Left displaced calcaneal fracture   PLAN:  Plan CT today for further evaluation with probable ORIF tomorrow.  Suspect entire achilles attachment is involved.  Armen Waring G 11/16/2017, 8:21 AM

## 2017-11-17 ENCOUNTER — Encounter (HOSPITAL_COMMUNITY): Payer: Self-pay

## 2017-11-17 ENCOUNTER — Inpatient Hospital Stay (HOSPITAL_COMMUNITY): Payer: Medicare Other | Admitting: Certified Registered"

## 2017-11-17 ENCOUNTER — Encounter (HOSPITAL_COMMUNITY)
Admission: EM | Disposition: A | Discharge status: Skilled Nursing Facility | Payer: Self-pay | Source: Home / Self Care | Attending: Internal Medicine

## 2017-11-17 DIAGNOSIS — I4891 Unspecified atrial fibrillation: Secondary | ICD-10-CM

## 2017-11-17 DIAGNOSIS — S86019A Strain of unspecified Achilles tendon, initial encounter: Secondary | ICD-10-CM

## 2017-11-17 HISTORY — PX: ACHILLES TENDON SURGERY: SHX542

## 2017-11-17 HISTORY — PX: ORIF CALCANEOUS FRACTURE: SHX5030

## 2017-11-17 LAB — CBC
HEMATOCRIT: 34.6 % — AB (ref 36.0–46.0)
HEMOGLOBIN: 11.2 g/dL — AB (ref 12.0–15.0)
MCH: 33.2 pg (ref 26.0–34.0)
MCHC: 32.4 g/dL (ref 30.0–36.0)
MCV: 102.7 fL — AB (ref 78.0–100.0)
Platelets: 220 10*3/uL (ref 150–400)
RBC: 3.37 MIL/uL — AB (ref 3.87–5.11)
RDW: 13.7 % (ref 11.5–15.5)
WBC: 6.1 10*3/uL (ref 4.0–10.5)

## 2017-11-17 LAB — BASIC METABOLIC PANEL
ANION GAP: 6 (ref 5–15)
ANION GAP: 5 (ref 5–15)
BUN: 17 mg/dL (ref 6–20)
BUN: 14 mg/dL (ref 6–20)
CALCIUM: 9.5 mg/dL (ref 8.9–10.3)
CALCIUM: 9.2 mg/dL (ref 8.9–10.3)
CHLORIDE: 104 mmol/L (ref 101–111)
CO2: 29 mmol/L (ref 22–32)
CO2: 28 mmol/L (ref 22–32)
Chloride: 104 mmol/L (ref 101–111)
Creatinine, Ser: 1.11 mg/dL — ABNORMAL HIGH (ref 0.44–1.00)
Creatinine, Ser: 1.05 mg/dL — ABNORMAL HIGH (ref 0.44–1.00)
GFR calc non Af Amer: 46 mL/min — ABNORMAL LOW (ref >60)
GFR, EST AFRICAN AMERICAN: 53 mL/min — AB (ref >60)
GFR, EST AFRICAN AMERICAN: 57 mL/min — AB (ref >60)
GFR, EST NON AFRICAN AMERICAN: 49 mL/min — AB (ref >60)
Glucose, Bld: 96 mg/dL (ref 65–99)
Glucose, Bld: 89 mg/dL (ref 65–99)
POTASSIUM: 5.2 mmol/L — AB (ref 3.5–5.1)
Potassium: 4.9 mmol/L (ref 3.5–5.1)
SODIUM: 137 mmol/L (ref 135–145)
Sodium: 139 mmol/L (ref 135–145)

## 2017-11-17 LAB — SURGICAL PCR SCREEN
MRSA, PCR: NEGATIVE
STAPHYLOCOCCUS AUREUS: NEGATIVE

## 2017-11-17 SURGERY — OPEN REDUCTION INTERNAL FIXATION (ORIF) CALCANEOUS FRACTURE
Anesthesia: Regional | Laterality: Left

## 2017-11-17 MED ORDER — LACTATED RINGERS IV BOLUS: 250.0000 mL | Freq: Once | INTRAVENOUS | Status: DC

## 2017-11-17 MED ORDER — SUGAMMADEX SODIUM 200 MG/2ML IV SOLN
INTRAVENOUS | Status: AC
  Filled 2017-11-17: qty 2

## 2017-11-17 MED ORDER — DEXAMETHASONE SODIUM PHOSPHATE 4 MG/ML IJ SOLN
INTRAMUSCULAR | Status: DC | PRN
  Administered 2017-11-17: 8 mg via INTRAVENOUS

## 2017-11-17 MED ORDER — GLYCOPYRROLATE 0.2 MG/ML IJ SOLN
INTRAMUSCULAR | Status: DC | PRN
  Administered 2017-11-17: 0.1 mg via INTRAVENOUS

## 2017-11-17 MED ORDER — PHENYLEPHRINE 40 MCG/ML (10ML) SYRINGE FOR IV PUSH (FOR BLOOD PRESSURE SUPPORT)
PREFILLED_SYRINGE | INTRAVENOUS | Status: DC | PRN
  Administered 2017-11-17: 80 ug via INTRAVENOUS

## 2017-11-17 MED ORDER — FENTANYL CITRATE (PF) 250 MCG/5ML IJ SOLN
INTRAMUSCULAR | Status: AC
  Filled 2017-11-17: qty 5

## 2017-11-17 MED ORDER — SCOPOLAMINE 1 MG/3DAYS TD PT72
MEDICATED_PATCH | TRANSDERMAL | Status: DC | PRN
  Administered 2017-11-17: 1 via TRANSDERMAL

## 2017-11-17 MED ORDER — LACTATED RINGERS IV SOLN
INTRAVENOUS | Status: DC
  Administered 2017-11-17: 15:00:00 via INTRAVENOUS

## 2017-11-17 MED ORDER — ROCURONIUM BROMIDE 10 MG/ML (PF) SYRINGE
PREFILLED_SYRINGE | INTRAVENOUS | Status: AC
  Filled 2017-11-17: qty 5

## 2017-11-17 MED ORDER — FENTANYL CITRATE (PF) 100 MCG/2ML IJ SOLN
100.0000 ug | Freq: Once | INTRAMUSCULAR | Status: AC
  Administered 2017-11-17: 100 ug via INTRAVENOUS

## 2017-11-17 MED ORDER — CHLORHEXIDINE GLUCONATE 4 % EX LIQD
CUTANEOUS | Status: AC
  Administered 2017-11-17: 13:00:00
  Filled 2017-11-17: qty 15

## 2017-11-17 MED ORDER — ONDANSETRON HCL 4 MG/2ML IJ SOLN
INTRAMUSCULAR | Status: AC
  Filled 2017-11-17: qty 2

## 2017-11-17 MED ORDER — LACTATED RINGERS IV SOLN
INTRAVENOUS | Status: DC | PRN
  Administered 2017-11-17: 15:00:00 via INTRAVENOUS

## 2017-11-17 MED ORDER — ROCURONIUM BROMIDE 100 MG/10ML IV SOLN
INTRAVENOUS | Status: DC | PRN
  Administered 2017-11-17: 50 mg via INTRAVENOUS

## 2017-11-17 MED ORDER — ROPIVACAINE HCL 5 MG/ML IJ SOLN
INTRAMUSCULAR | Status: DC | PRN
  Administered 2017-11-17: 30 mL via EPIDURAL

## 2017-11-17 MED ORDER — LIDOCAINE 2% (20 MG/ML) 5 ML SYRINGE
INTRAMUSCULAR | Status: DC | PRN
  Administered 2017-11-17: 100 mg via INTRAVENOUS

## 2017-11-17 MED ORDER — ARTIFICIAL TEARS OPHTHALMIC OINT
TOPICAL_OINTMENT | OPHTHALMIC | Status: AC
  Filled 2017-11-17: qty 3.5

## 2017-11-17 MED ORDER — DEXAMETHASONE SODIUM PHOSPHATE 10 MG/ML IJ SOLN
INTRAMUSCULAR | Status: AC
  Filled 2017-11-17: qty 1

## 2017-11-17 MED ORDER — DILTIAZEM HCL-DEXTROSE 100-5 MG/100ML-% IV SOLN (PREMIX)
5.0000 mg/h | INTRAVENOUS | Status: DC
  Administered 2017-11-17: 5 mg/h via INTRAVENOUS
  Filled 2017-11-17: qty 100

## 2017-11-17 MED ORDER — EPHEDRINE SULFATE 50 MG/ML IJ SOLN
INTRAMUSCULAR | Status: AC
  Filled 2017-11-17: qty 1

## 2017-11-17 MED ORDER — PHENYLEPHRINE HCL 10 MG/ML IJ SOLN
INTRAVENOUS | Status: DC | PRN
  Administered 2017-11-17: 60 ug/min via INTRAVENOUS

## 2017-11-17 MED ORDER — DILTIAZEM HCL 100 MG IV SOLR
5.0000 mg/h | INTRAVENOUS | Status: DC
  Administered 2017-11-17: 5 mg/h via INTRAVENOUS
  Filled 2017-11-17: qty 100

## 2017-11-17 MED ORDER — PROPOFOL 10 MG/ML IV BOLUS
INTRAVENOUS | Status: DC | PRN
  Administered 2017-11-17: 140 mg via INTRAVENOUS

## 2017-11-17 MED ORDER — CLONIDINE HCL (ANALGESIA) 100 MCG/ML EP SOLN
EPIDURAL | Status: DC | PRN
  Administered 2017-11-17: 100 ug

## 2017-11-17 MED ORDER — PHENYLEPHRINE 40 MCG/ML (10ML) SYRINGE FOR IV PUSH (FOR BLOOD PRESSURE SUPPORT)
PREFILLED_SYRINGE | INTRAVENOUS | Status: AC
  Filled 2017-11-17: qty 10

## 2017-11-17 MED ORDER — LIDOCAINE 2% (20 MG/ML) 5 ML SYRINGE
INTRAMUSCULAR | Status: AC
  Filled 2017-11-17: qty 5

## 2017-11-17 MED ORDER — 0.9 % SODIUM CHLORIDE (POUR BTL) OPTIME
TOPICAL | Status: DC | PRN
  Administered 2017-11-17: 1000 mL

## 2017-11-17 MED ORDER — PROMETHAZINE HCL 25 MG/ML IJ SOLN: 6.2500 mg | INTRAMUSCULAR | Status: DC | PRN

## 2017-11-17 MED ORDER — SCOPOLAMINE 1 MG/3DAYS TD PT72
MEDICATED_PATCH | TRANSDERMAL | Status: AC
  Filled 2017-11-17: qty 1

## 2017-11-17 MED ORDER — LACTATED RINGERS IV SOLN: INTRAVENOUS | Status: DC

## 2017-11-17 MED ORDER — POVIDONE-IODINE 10 % EX SWAB: 2.0000 "application " | Freq: Once | CUTANEOUS | Status: DC

## 2017-11-17 MED ORDER — FENTANYL CITRATE (PF) 100 MCG/2ML IJ SOLN
INTRAMUSCULAR | Status: AC
  Administered 2017-11-17: 100 ug via INTRAVENOUS
  Filled 2017-11-17: qty 2

## 2017-11-17 MED ORDER — DILTIAZEM HCL-DEXTROSE 100-5 MG/100ML-% IV SOLN (PREMIX): 5.0000 mg/h | INTRAVENOUS | Status: DC

## 2017-11-17 MED ORDER — SUGAMMADEX SODIUM 200 MG/2ML IV SOLN
INTRAVENOUS | Status: DC | PRN
  Administered 2017-11-17: 200 mg via INTRAVENOUS

## 2017-11-17 MED ORDER — CHLORHEXIDINE GLUCONATE 4 % EX LIQD
60.0000 mL | Freq: Once | CUTANEOUS | Status: AC
  Administered 2017-11-17: 4 via TOPICAL

## 2017-11-17 MED ORDER — METOPROLOL TARTRATE 5 MG/5ML IV SOLN
INTRAVENOUS | Status: AC
  Filled 2017-11-17: qty 5

## 2017-11-17 MED ORDER — HYDROMORPHONE HCL 2 MG/ML IJ SOLN: 0.3000 mg | INTRAMUSCULAR | Status: DC | PRN

## 2017-11-17 MED ORDER — FENTANYL CITRATE (PF) 100 MCG/2ML IJ SOLN
INTRAMUSCULAR | Status: DC | PRN
  Administered 2017-11-17: 25 ug via INTRAVENOUS

## 2017-11-17 MED ORDER — CEFAZOLIN SODIUM-DEXTROSE 2-4 GM/100ML-% IV SOLN
2.0000 g | INTRAVENOUS | Status: AC
  Administered 2017-11-17: 2 g via INTRAVENOUS
  Filled 2017-11-17: qty 100

## 2017-11-17 MED ORDER — METOPROLOL TARTRATE 5 MG/5ML IV SOLN
5.0000 mg | Freq: Once | INTRAVENOUS | Status: AC
  Administered 2017-11-17: 5 mg via INTRAVENOUS
  Filled 2017-11-17: qty 5

## 2017-11-17 MED ORDER — HYDROMORPHONE HCL 1 MG/ML IJ SOLN
1.0000 mg | INTRAMUSCULAR | Status: DC | PRN
  Administered 2017-11-18 – 2017-11-20 (×7): 1 mg via INTRAVENOUS
  Filled 2017-11-17 (×7): qty 1

## 2017-11-17 MED ORDER — ARTIFICIAL TEARS OPHTHALMIC OINT
TOPICAL_OINTMENT | OPHTHALMIC | Status: DC | PRN
  Administered 2017-11-17: 1 via OPHTHALMIC

## 2017-11-17 MED ORDER — DILTIAZEM LOAD VIA INFUSION
10.0000 mg | Freq: Once | INTRAVENOUS | Status: AC
  Administered 2017-11-17: 10 mg via INTRAVENOUS
  Filled 2017-11-17: qty 10

## 2017-11-17 MED ORDER — PROPOFOL 10 MG/ML IV BOLUS
INTRAVENOUS | Status: AC
Start: 2017-11-17 — End: ?
  Filled 2017-11-17: qty 20

## 2017-11-17 SURGICAL SUPPLY — 50 items
ANCHOR PEEK ALL THREAD (Anchor) ×2 IMPLANT
BANDAGE ACE 4X5 VEL STRL LF (GAUZE/BANDAGES/DRESSINGS) ×2 IMPLANT
BANDAGE ACE 6X5 VEL STRL LF (GAUZE/BANDAGES/DRESSINGS) ×2 IMPLANT
BANDAGE ESMARK 6X9 LF (GAUZE/BANDAGES/DRESSINGS) ×1 IMPLANT
BLADE CLIPPER SURG (BLADE) IMPLANT
BLADE SURG 10 STRL SS (BLADE) ×1 IMPLANT
BNDG ESMARK 6X9 LF (GAUZE/BANDAGES/DRESSINGS) ×3
BNDG GAUZE ELAST 4 BULKY (GAUZE/BANDAGES/DRESSINGS) ×2 IMPLANT
COVER MAYO STAND STRL (DRAPES) ×1 IMPLANT
COVER SURGICAL LIGHT HANDLE (MISCELLANEOUS) ×3 IMPLANT
CUFF TOURNIQUET SINGLE 34IN LL (TOURNIQUET CUFF) ×2 IMPLANT
CUFF TOURNIQUET SINGLE 44IN (TOURNIQUET CUFF) IMPLANT
DRAPE OEC MINIVIEW 54X84 (DRAPES) ×2 IMPLANT
DRAPE U-SHAPE 47X51 STRL (DRAPES) ×3 IMPLANT
DRSG PAD ABDOMINAL 8X10 ST (GAUZE/BANDAGES/DRESSINGS) ×2 IMPLANT
DURAPREP 26ML APPLICATOR (WOUND CARE) ×3 IMPLANT
ELECT REM PT RETURN 9FT ADLT (ELECTROSURGICAL) ×3
ELECTRODE REM PT RTRN 9FT ADLT (ELECTROSURGICAL) ×1 IMPLANT
GAUZE SPONGE 4X4 12PLY STRL (GAUZE/BANDAGES/DRESSINGS) ×2 IMPLANT
GAUZE XEROFORM 1X8 LF (GAUZE/BANDAGES/DRESSINGS) ×2 IMPLANT
GLOVE BIOGEL PI IND STRL 8 (GLOVE) ×2 IMPLANT
GLOVE BIOGEL PI INDICATOR 8 (GLOVE) ×4
GLOVE ECLIPSE 7.5 STRL STRAW (GLOVE) ×6 IMPLANT
GOWN STRL REUS W/ TWL LRG LVL3 (GOWN DISPOSABLE) ×1 IMPLANT
GOWN STRL REUS W/ TWL XL LVL3 (GOWN DISPOSABLE) ×2 IMPLANT
GOWN STRL REUS W/TWL LRG LVL3 (GOWN DISPOSABLE) ×2
GOWN STRL REUS W/TWL XL LVL3 (GOWN DISPOSABLE) ×4
KIT BASIN OR (CUSTOM PROCEDURE TRAY) ×3 IMPLANT
KIT TURNOVER KIT B (KITS) ×3 IMPLANT
NS IRRIG 1000ML POUR BTL (IV SOLUTION) ×3 IMPLANT
PACK ORTHO EXTREMITY (CUSTOM PROCEDURE TRAY) ×3 IMPLANT
PAD ARMBOARD 7.5X6 YLW CONV (MISCELLANEOUS) ×6 IMPLANT
PAD CAST 4YDX4 CTTN HI CHSV (CAST SUPPLIES) IMPLANT
PADDING CAST COTTON 4X4 STRL (CAST SUPPLIES) ×4
PADDING CAST COTTON 6X4 STRL (CAST SUPPLIES) ×2 IMPLANT
SPLINT PLASTER CAST XFAST 5X30 (CAST SUPPLIES) IMPLANT
SPLINT PLASTER XFAST SET 5X30 (CAST SUPPLIES) ×2
STAPLER VISISTAT 35W (STAPLE) IMPLANT
SUCTION FRAZIER HANDLE 10FR (MISCELLANEOUS) ×2
SUCTION TUBE FRAZIER 10FR DISP (MISCELLANEOUS) ×1 IMPLANT
SUT ETHILON 3 0 PS 1 (SUTURE) ×2 IMPLANT
SUT ETHILON 4 0 PS 2 18 (SUTURE) ×2 IMPLANT
SUT VIC AB 0 CTB1 27 (SUTURE) ×2 IMPLANT
SUT VIC AB 2-0 FS1 27 (SUTURE) ×2 IMPLANT
SYR CONTROL 10ML LL (SYRINGE) IMPLANT
TOWEL OR 17X24 6PK STRL BLUE (TOWEL DISPOSABLE) ×1 IMPLANT
TOWEL OR 17X26 10 PK STRL BLUE (TOWEL DISPOSABLE) ×3 IMPLANT
TUBE CONNECTING 12'X1/4 (SUCTIONS) ×1
TUBE CONNECTING 12X1/4 (SUCTIONS) ×2 IMPLANT
WATER STERILE IRR 1000ML POUR (IV SOLUTION) ×1 IMPLANT

## 2017-11-17 NOTE — Interval H&P Note (Signed)
History and Physical Interval Note:  11/17/2017 3:17 PM  Jennifer Goodman  has presented today for surgery, with the diagnosis of LEFT CALCANEOUS FRACTURE  The various methods of treatment have been discussed with the patient and family. After consideration of risks, benefits and other options for treatment, the patient has consented to  Procedure(s) with comments: OPEN REDUCTION INTERNAL FIXATION (ORIF) CALCANEOUS FRACTURE (Left) - Patient is now an inpatient at [facility] will request that she be moved to [facility] and admitted there for this procedure to be done there. as a surgical intervention .  The patient's history has been reviewed, patient examined, no change in status, stable for surgery.  I have reviewed the patient's chart and labs.  Questions were answered to the patient's satisfaction.     Marcos Ruelas G

## 2017-11-17 NOTE — Transfer of Care (Signed)
Immediate Anesthesia Transfer of Care Note  Patient: Jennifer Goodman  Procedure(s) Performed: OPEN TREATMENT OF CALCANEOUS FRACTURE (Left ) ACHILLES TENDON REPAIR (Left )  Patient Location: PACU  Anesthesia Type:General and Regional  Level of Consciousness: awake, oriented and patient cooperative  Airway & Oxygen Therapy: Patient Spontanous Breathing and Patient connected to nasal cannula oxygen  Post-op Assessment: Report given to RN and Post -op Vital signs reviewed and stable  Post vital signs: Reviewed and stable  Last Vitals:  Vitals Value Taken Time  BP 102/74 11/17/2017  5:39 PM  Temp    Pulse 84 11/17/2017  5:42 PM  Resp 10 11/17/2017  5:42 PM  SpO2 100 % 11/17/2017  5:42 PM  Vitals shown include unvalidated device data.  Last Pain:  Vitals:   11/17/17 1300  TempSrc: Oral  PainSc:       Patients Stated Pain Goal: 3 (27/07/86 7544)  Complications: No apparent anesthesia complications

## 2017-11-17 NOTE — Progress Notes (Signed)
Patient in new a fib RVR once brought to PACU. 12 lead ECG done, confirming a fib RVR. Cardiology consulted by Dr. Marcell Barlow. Rosaria Ferries, PA and Dr. Sallyanne Kuster here to see patient. Ordered dose of IV lopressor and cardizem drip. Will await orders.

## 2017-11-17 NOTE — Anesthesia Procedure Notes (Signed)
Procedure Name: Intubation Date/Time: 11/17/2017 4:11 PM Performed by: Orlie Dakin, CRNA Pre-anesthesia Checklist: Patient identified, Emergency Drugs available, Suction available, Patient being monitored and Timeout performed Patient Re-evaluated:Patient Re-evaluated prior to induction Oxygen Delivery Method: Circle system utilized Preoxygenation: Pre-oxygenation with 100% oxygen Induction Type: IV induction Ventilation: Mask ventilation without difficulty and Oral airway inserted - appropriate to patient size Laryngoscope Size: Sabra Heck and 3 Grade View: Grade I Tube type: Oral Tube size: 7.0 mm Number of attempts: 1 Airway Equipment and Method: Stylet Placement Confirmation: ETT inserted through vocal cords under direct vision,  positive ETCO2 and breath sounds checked- equal and bilateral Secured at: 21 cm Tube secured with: Tape Dental Injury: Teeth and Oropharynx as per pre-operative assessment  Comments: 4x4s bite block used.

## 2017-11-17 NOTE — Anesthesia Preprocedure Evaluation (Signed)
Anesthesia Evaluation  Patient identified by MRN, date of birth, ID band Patient awake    Reviewed: Allergy & Precautions, NPO status , Patient's Chart, lab work & pertinent test results  History of Anesthesia Complications (+) PONV and history of anesthetic complications  Airway Mallampati: II  TM Distance: >3 FB Neck ROM: Full    Dental  (+) Teeth Intact, Dental Advisory Given   Pulmonary pneumonia, resolved, former smoker,    breath sounds clear to auscultation       Cardiovascular hypertension, Pt. on medications  Rhythm:Regular Rate:Normal     Neuro/Psych negative neurological ROS  negative psych ROS   GI/Hepatic negative GI ROS, Neg liver ROS,   Endo/Other  Hypothyroidism   Renal/GU negative Renal ROS  negative genitourinary   Musculoskeletal  (+) Arthritis , Rheumatoid disorders,  Fx Right Olecranon   Abdominal Normal abdominal exam  (+)   Peds  Hematology  (+) anemia ,   Anesthesia Other Findings   Reproductive/Obstetrics                             Anesthesia Physical  Anesthesia Plan  ASA: III  Anesthesia Plan: General   Post-op Pain Management:  Regional for Post-op pain   Induction: Intravenous  PONV Risk Score and Plan: 4 or greater and Ondansetron, Dexamethasone and Treatment may vary due to age or medical condition  Airway Management Planned: Oral ETT  Additional Equipment:   Intra-op Plan:   Post-operative Plan: Extubation in OR  Informed Consent: I have reviewed the patients History and Physical, chart, labs and discussed the procedure including the risks, benefits and alternatives for the proposed anesthesia with the patient or authorized representative who has indicated his/her understanding and acceptance.   Dental advisory given  Plan Discussed with: CRNA and Anesthesiologist  Anesthesia Plan Comments:         Anesthesia Quick Evaluation

## 2017-11-17 NOTE — Consult Note (Addendum)
Cardiology Consultation:   Patient ID: Jennifer Goodman; [id number]; 03-Oct-1934   Admit date: 11/15/2017 Date of Consult: 11/17/2017  Primary Care Provider: Martinique, Betty G, MD Primary Cardiologist: New, Dr. Sallyanne Kuster Primary Electrophysiologist: None   Patient Profile:   Jennifer Goodman is a 83 y.o. female with a hx of RA, HTN, hypothyroid, diverticulosis and colon polyps but no cardiac history who is being seen today for the evaluation of rapid atrial fibrillation at the request of Dr. Marcell Barlow.  History of Present Illness:   [name] fell on 11/15/2017 when she tripped over a door sill.  She fractured her left heel.  She was admitted, and seen by orthopedics.  She was taken to the OR today by Dr. Rhona Raider, and had open treatment of calcaneous fracture and TAR.   Postop, she went into rapid atrial fibrillation and cardiology was asked to evaluate her  [name] is aware that her heart is not beating right, but denies any palpitations or chest pain.  She can tell that it is just not quite right.  She has never been evaluated for this before.  She had some kind of palpitations about 25 years ago but never got a diagnosis.  She has no history of palpitations since then, no history of presyncope or syncope.  She used to be active around the house and yard, but has had knee replacements and then injured her knee, limiting her.  She also has back problems that limit her.  However, she has continued to play golf every Friday.  However, she has never had chest pain with exertion.  She denies any new dyspnea on exertion, lower extremity edema except in response to the injury, orthopnea or PND.  Her hypothyroidism is treated.   Past Medical History:  Diagnosis Date  . Anemia   . Arthritis    Rhuematoid-takes Enbrel and Methorexate   . Back pain    states in Jan 2017 spinal was attempted 4 times and has had back pain since.  . Cancer ([facility])    skin cancer on head  . Chicken pox   .  Diverticulosis   . History of colon polyps    benign  . Hypertension    takes Avapro daily  . Hypothyroidism    takes Synthroid daily  . Joint pain   . Pneumonia    at age 73  . PONV (postoperative nausea and vomiting)    the patch helps!  . Urinary frequency   . Urinary urgency    was taking Myrbetriq but stopped.Plans to start back    Past Surgical History:  Procedure Laterality Date  . CHOLECYSTECTOMY  2000  . COLONOSCOPY    . EYE SURGERY Left    tear duct surgery  . HAND TENDON SURGERY     Dr. Amedeo Plenty  . KNEE ARTHROSCOPY    . ORIF ELBOW FRACTURE Right 02/25/2017   Procedure: OPEN REDUCTION INTERNAL FIXATION (ORIF) RIGHT PROXIMAL OLECRANON FRACTURE;  Surgeon: Iran Planas, MD;  Location: [facility];  Service: Orthopedics;  Laterality: Right;  . TOTAL KNEE ARTHROPLASTY Left 06/20/2015  . TOTAL KNEE ARTHROPLASTY Left 06/20/2015   Procedure: TOTAL KNEE ARTHROPLASTY;  Surgeon: Dorna Leitz, MD;  Location: [facility];  Service: Orthopedics;  Laterality: Left;  . TOTAL KNEE ARTHROPLASTY Right 05/28/2016   Procedure: TOTAL KNEE ARTHROPLASTY;  Surgeon: Dorna Leitz, MD;  Location: [facility];  Service: Orthopedics;  Laterality: Right;     Prior to Admission medications   Medication Sig Start Date End  Date Taking? Authorizing Provider  calcium carbonate (CALCIUM 600) 600 MG TABS tablet Take 1,200 mg by mouth daily.   Yes [provider]  Cholecalciferol (VITAMIN D3) 5000 UNITS TABS Take 10,000 tablets by mouth daily.    Yes [provider]  cyanocobalamin (,VITAMIN B-12,) 1000 MCG/ML injection Inject 1,000 mcg into the muscle every 30 (thirty) days.   Yes [provider]  etanercept (ENBREL) 50 MG/ML injection Inject 50 mg into the skin once a week. Thursdays   Yes [provider]  folic acid (FOLVITE) 1 MG tablet Take 1 mg by mouth daily.   Yes [provider]  HYDROcodone-acetaminophen (NORCO/VICODIN) 5-325 MG tablet Take 1 tablet by mouth 2 (two) times  daily as needed for moderate pain.   Yes [provider]  irbesartan (AVAPRO) 150 MG tablet TAKE 1 TABLET DAILY FOR 5 DAYS. 08/26/17  Yes Martinique, Betty G, MD  levothyroxine (SYNTHROID, LEVOTHROID) 50 MCG tablet Take 50 mcg by mouth daily before breakfast.   Yes [provider]  methotrexate (RHEUMATREX) 2.5 MG tablet Take 20 mg by mouth every Monday.    Yes [provider]  doxycycline (VIBRAMYCIN) 100 MG capsule Take 1 capsule (100 mg total) by mouth 2 (two) times daily. Patient not taking: Reported on 11/15/2017 10/10/17   Fransico Meadow, PA-C    Inpatient Medications: Scheduled Meds: . [MAR Hold] calcium carbonate  1,250 mg Oral Daily  . [MAR Hold] enoxaparin (LOVENOX) injection  40 mg Subcutaneous Q24H  . [MAR Hold] folic acid  1 mg Oral Daily  . [MAR Hold] levothyroxine  50 mcg Oral QAC breakfast  . povidone-iodine  2 application Topical Once   Continuous Infusions: . sodium chloride 75 mL/hr at 11/17/17 0514  . lactated ringers    . lactated ringers 10 mL/hr at 11/17/17 1515   PRN Meds: [MAR Hold] HYDROcodone-acetaminophen, HYDROmorphone (DILAUDID) injection, [MAR Hold]  HYDROmorphone (DILAUDID) injection, [MAR Hold] ketorolac, [MAR Hold] methocarbamol, [MAR Hold] ondansetron **OR** [MAR Hold] ondansetron (ZOFRAN) IV, [MAR Hold] polyethylene glycol, promethazine  Allergies:    Allergies  Allergen Reactions  . Oxycodone Other (See Comments)    Makes hallucinates  . Sulfamethoxazole Nausea Only    Social History:   Social History   Socioeconomic History  . Marital status: Married    Spouse name: Not on file  . Number of children: 2  . Years of education: Not on file  . Highest education level: Not on file  Occupational History  . Occupation: Retired  Scientific laboratory technician  . Financial resource strain: Not on file  . Food insecurity:    Worry: Not on file    Inability: Not on file  . Transportation needs:    Medical: Not on file    Non-medical: Not  on file  Tobacco Use  . Smoking status: Former Smoker    Last attempt to quit: 06/14/1978    Years since quitting: 39.4  . Smokeless tobacco: Never Used  . Tobacco comment: quit 32 yrs ago  Substance and Sexual Activity  . Alcohol use: Yes    Comment: occ  . Drug use: No  . Sexual activity: Never  Lifestyle  . Physical activity:    Days per week: Not on file    Minutes per session: Not on file  . Stress: Not on file  Relationships  . Social connections:    Talks on phone: Not on file    Gets together: Not on file    Attends religious service:  Not on file    Active member of club or organization: Not on file    Attends meetings of clubs or organizations: Not on file    Relationship status: Not on file  . Intimate partner violence:    Fear of current or ex partner: Not on file    Emotionally abused: Not on file    Physically abused: Not on file    Forced sexual activity: Not on file  Other Topics Concern  . Not on file  Social History Narrative   She lives in [place] with her husband.    Family History:   Family History  Problem Relation Age of Onset  . Cancer Father        colon ca/ lung ca  . Heart disease Father    Family Status:  Family Status  Relation Name Status  . Mother  Deceased  . Father  Deceased    ROS:  Please see the history of present illness.  All other ROS reviewed and negative.     Physical Exam/Data:   Vitals:   11/17/17 1545 11/17/17 1734 11/17/17 1739 11/17/17 1749  BP:  (!) 128/102 102/74 122/77  Pulse: 66 (!) 104 100 (!) 125  Resp: 15 16 17 17   Temp:  (!) 97.5 F (36.4 C)    TempSrc:      SpO2: 100% 98% 99% 100%  Weight:      Height:        Intake/Output Summary (Last 24 hours) at 11/17/2017 1825 Last data filed at 11/17/2017 1738 Gross per 24 hour  Intake 1225 ml  Output 1400 ml  Net -175 ml   Filed Weights   11/16/17 0003  Weight: 156 lb (70.8 kg)   Body mass index is 26.78 kg/m.  General:  Well nourished, well  developed, slender elderly female in no acute distress HEENT: normal Lymph: no adenopathy Neck: JVD seen, but patient is essentially supine Endocrine:  No thryomegaly Vascular: No carotid bruits; 3/4 extremity pulses 2+, unable to evaluate left pedal pulses due to bandaging, capillary refill is within normal limits Cardiac:  normal S1, S2; rapid and irregular rate and rhythm; no murmur  Lungs: Essentially clear to auscultation bilaterally, no wheezing, rhonchi or rales  Abd: soft, nontender, no hepatomegaly  Ext: no edema on the right Musculoskeletal:  No deformities noted, BUE strength normal and equal, unable to assess bilateral lower extremity strength due to recent surgery Skin: warm and dry  Neuro:  CNs 2-12 intact, no focal abnormalities noted Psych:  Normal affect   EKG:  The EKG was personally reviewed and demonstrates: Rapid atrial fibrillation, heart rate 123, no acute ischemic changes Telemetry:  Telemetry was personally reviewed and demonstrates: Rapid atrial fibrillation, heart rate sometimes in the 140s  Relevant CV Studies:  None  Laboratory Data:  Chemistry Recent Labs  Lab 11/16/17 0540 11/17/17 0646 11/17/17 1205  NA 135 139 137  K 4.4 5.2* 4.9  CL 102 104 104  CO2 25 29 28   GLUCOSE 108* 96 89  BUN 21* 17 14  CREATININE 0.97 1.11* 1.05*  CALCIUM 8.7* 9.5 9.2  GFRNONAA 54* 46* 49*  GFRAA >60 53* 57*  ANIONGAP 8 6 5     Lab Results  Component Value Date   ALT 18 11/16/2017   AST 29 11/16/2017   ALKPHOS 62 11/16/2017   BILITOT 1.6 (H) 11/16/2017   Hematology Recent Labs  Lab 11/15/17 2114 11/16/17 0540 11/17/17 0646  WBC 8.2 8.1 6.1  RBC  3.55* 3.43* 3.37*  HGB 11.9* 11.3* 11.2*  HCT 35.9* 34.2* 34.6*  MCV 101.1* 99.7 102.7*  MCH 33.5 32.9 33.2  MCHC 33.1 33.0 32.4  RDW 14.3 14.3 13.7  PLT 248 242 220   TSH:  Lab Results  Component Value Date   TSH 0.76 08/24/2017   Magnesium:  Magnesium  Date Value Ref Range Status  11/16/2017 1.8  1.7 - 2.4 mg/dL Final    Comment:    Performed at [facility], [place] [address] [place], [place] [address]     Radiology/Studies:  Dg Ribs Unilateral W/chest Right  Result Date: 11/15/2017 CLINICAL DATA:  Fall today coming in from garage.  Right rib pain. EXAM: RIGHT RIBS AND CHEST - 3+ VIEW COMPARISON:  Chest radiograph 10/10/2017 FINDINGS: No fracture or other bone lesions are seen involving the right ribs. Remote left rib fracture, as seen on prior exam. There is no evidence of pneumothorax or pleural effusion. Calcified granuloma in the right lung. Heart size and mediastinal contours are within normal limits. IMPRESSION: Negative radiographs of the chest and right ribs. Electronically Signed   By: Jeb Levering M.D.   On: 11/15/2017 21:55   Dg Lumbar Spine Complete  Result Date: 11/15/2017 CLINICAL DATA:  Fall coming in from garage, lumbosacral back pain. EXAM: LUMBAR SPINE - COMPLETE 4+ VIEW COMPARISON:  Lumbar spine MRI 09/06/2017 FINDINGS: Moderate levo scoliotic curvature of the lumbar spine. Anterolisthesis of L4 on L5 retrolisthesis of L2 on L3 appears degenerative. Remote L1 compression fracture. Remaining lumbar vertebral body heights are preserved. Diffuse disc space narrowing and endplate spurring and facet arthropathy. The bones are under mineralized. Aorto bi-iliac atherosclerosis is incidentally noted. IMPRESSION: 1. No fracture or acute osseous abnormality of the lumbar spine. 2. Scoliosis and multilevel degenerative change. Remote L1 compression fracture. Electronically Signed   By: Jeb Levering M.D.   On: 11/15/2017 21:57   Dg Pelvis 1-2 Views  Result Date: 11/15/2017 CLINICAL DATA:  Fall coming in from garage tonight. EXAM: PELVIS - 1-2 VIEW COMPARISON:  Abdominopelvic CT 05/05/2015 FINDINGS: Remote right pubic body fracture, pubic symphysis is congruent. No acute fracture. Both femoral heads are well seated in the respective acetabula. Sacroiliac joints  are congruent. IMPRESSION: 1. No acute pelvic fracture. 2. Remote right pubic body fracture. Electronically Signed   By: Jeb Levering M.D.   On: 11/15/2017 21:58   Dg Ankle Complete Left  Result Date: 11/15/2017 CLINICAL DATA:  Fall, in from garage.  Left foot and ankle pain. EXAM: LEFT ANKLE COMPLETE - 3+ VIEW COMPARISON:  None. FINDINGS: Displaced avulsion fracture at the posterior calcaneus at the Achilles tendon insertion. Displaced fracture fragment measures 2.4 cm, there is approximately 2.7 cm distraction posteriorly. Apex dorsal angulation. Base of the fifth metatarsal fracture better assessed on concurrent foot radiographs. No additional fracture of the ankle. Prominent plantar calcaneal spur. The ankle mortise is preserved. Diffuse bony under mineralization. IMPRESSION: 1. Displaced posterior calcaneal fracture at the Achilles insertion with 2.7 cm osseous distraction. 2. Base of the fifth metatarsal fracture, better assessed on concurrent foot radiographs. Electronically Signed   By: Jeb Levering M.D.   On: 11/15/2017 22:05   Ct Ankle Left Wo Contrast  Result Date: 11/16/2017 CLINICAL DATA:  Calcaneus fracture. EXAM: CT OF THE LEFT ANKLE WITHOUT CONTRAST TECHNIQUE: Multidetector CT imaging of the left ankle was performed according to the standard protocol. Multiplanar CT image reconstructions were also generated. COMPARISON:  Radiographs dated 11/15/2017 FINDINGS: Bones/Joint/Cartilage There is a  displaced rotated fracture of the posterosuperior aspect of the calcaneus at the Achilles insertion. There is a fracture through the base of the fifth metatarsal with slight distraction. Marked diffuse osteopenia. Muscles and Tendons No discrete abnormality except for proximal retraction of the Achilles tendon due to the avulsion fracture of the posterior calcaneus. No discrete abnormality of the other tendons around the ankle. Soft tissues Multiple small calcifications in the subcutaneous soft  tissues of lower leg. Slight soft tissue edema around the ankle and foot. Chronic calcification in the plantar fascia. IMPRESSION: Displaced rotated avulsion fracture of the posterosuperior aspect of the calcaneus at the Achilles insertion. Slightly distracted fracture of the base of the fifth metatarsal. Marked diffuse osteopenia. Electronically Signed   By: Lorriane Shire M.D.   On: 11/16/2017 14:07   Dg Foot Complete Left  Result Date: 11/15/2017 CLINICAL DATA:  Status post fall, with bilateral foot pain. Initial encounter. EXAM: LEFT FOOT - COMPLETE 3+ VIEW COMPARISON:  None. FINDINGS: There is a significantly displaced avulsion fracture at the posterosuperior calcaneus, reflecting avulsion of the Achilles tendon. There appear to be fractures through the base of the fifth metatarsal and base of the fifth proximal phalanx, with underlying lucency, suggesting these may be subacute in nature. No additional fractures are seen. There is mild osteopenia of visualized osseous structures. An os peroneum is noted. A plantar calcaneal spur is noted. IMPRESSION: 1. Significantly displaced avulsion fracture at the posterosuperior calcaneus, reflecting avulsion of the Achilles tendon. 2. Fractures through the base of the fifth metatarsal and base of the fifth proximal phalanx, with underlying lucency, suggesting these may be subacute in nature. 3. Mild osteopenia of visualized osseous structures. 4. Os peroneum noted. Electronically Signed   By: Garald Balding M.D.   On: 11/15/2017 23:01   Dg Foot Complete Right  Result Date: 11/15/2017 CLINICAL DATA:  Fall coming in from garage tonight. Right foot pain. EXAM: RIGHT FOOT COMPLETE - 3+ VIEW COMPARISON:  None. FINDINGS: Fracture at the base of the fifth metatarsal has sclerotic margins and is likely chronic. Remote fractures of the first, second, and third metatarsal shafts. No acute fracture. Bones significantly under mineralized. Small plantar calcaneal spur and  Achilles tendon enthesophyte. IMPRESSION: 1. Fracture at the base of the fifth metatarsal has sclerotic margins and is likely chronic, recommend correlation with point tenderness. 2. Remote first, second, and third metatarsal shaft fractures. Electronically Signed   By: Jeb Levering M.D.   On: 11/15/2017 22:59    Assessment and Plan:   1.  Atrial fibrillation, RVR - in the setting of acute injury/surgery - TSH normal, on Synthroid - BP limits rx but will try 1 dose of metoprolol and IV Cardizem - hopefully will convert to SR spontaneously - ck echo  2. Anticoag - CHA2DS2VASc= 4 (age x 2, female, HTN)   - MD advise on plan for anticoag - will defer timing to surgeons.  Otherwise, per IM Principal Problem:   Achilles tendon avulsion Active Problems:   Hypertension, essential, benign   Hypothyroidism (acquired)   CKD (chronic kidney disease), stage III (HCC)   Fall   Displaced fracture of fifth metatarsal bone, left foot, initial encounter for closed fracture   Fracture, foot, left, closed, initial encounter     For questions or updates, please contact [facility] Please consult [email or web address] for contact info under Cardiology/STEMI.   Signed, Rosaria Ferries, PA-C  11/17/2017 6:25 PM  I have seen and examined the patient along with Rosaria Ferries, PA-C .  I have reviewed the chart, notes and new data.  I agree with PA's note.  Key new complaints: She reports palpitations about 25 years ago without a firm diagnosis.  She is minimally aware of palpitations right now, in atrial fibrillation with ventricular rates in the 130s.  She denies dyspnea or chest pain.  Other than her age, she does not really have any cardiac risk factors. Key examination changes: Rapid irregular rhythm, otherwise normal cardiovascular exam. Key new findings / data: ECG shows atrial fibrillation with rapid ventricular response, without any other meaningful abnormalities.  note that admission ECG shows  sinus bradycardia at 51 bpm.  She is not on any chronic medications with a negative chronotropic effect.  Normal TSH, no other significant lab abnormalities.  PLAN: Postoperative atrial fibrillation related to hyperadrenergic state.  Cannot exclude pre-existing asymptomatic episodes of paroxysmal atrial fibrillation. Will start intravenous diltiazem for rate control.  Her blood pressure may be an initial limiting factor in treatment with rate control medications, but I am also concerned about worsen sinus bradycardia when she converts back to normal rhythm, since her baseline heart rate appears to be only about 50 bpm. She just finished her surgical procedures and we will not yet start anticoagulants.  Appreciate surgical's guidance regarding timing of anticoagulant initiation.  I would recommend initiation of oral anticoagulants tomorrow. CHADSVasc 4 (age 60, gender, hypertension), no history of embolic events, stroke/TIA.  Sanda Klein, MD, [place] [phone] 11/17/2017, 6:57 PM

## 2017-11-17 NOTE — Progress Notes (Signed)
PROGRESS NOTE    Jennifer Goodman  OYD:[id number] DOB: 1935-03-13 DOA: 11/15/2017 PCP: [place], Betty G, MD   Brief Narrative:  83 y.o.femalewith medical history significant ofhypertension, hypothyroidism, osteoarthritis, osteoporosis,previousfalls with multiple orthopedic surgeries who presented to the year today after sustaining mechanical fall at home.     Assessment & Plan:   Principal Problem:   Achilles tendon avulsion Active Problems:   Hypertension, essential, benign   Hypothyroidism (acquired)   CKD (chronic kidney disease), stage III (HCC)   Fall   Displaced fracture of fifth metatarsal bone, left foot, initial encounter for closed fracture   Fracture, foot, left, closed, initial encounter  Achilles tendon avulsion, calcaneus fracture.  CT ankle; Displaced rotated avulsion fracture of the posterosuperior aspect of the calcaneus at the Achilles insertion. Slightly distracted fracture of the base of the fifth metatarsal. -for OR today.   HTN; hold irbesartan per op and due to hyperkalemia.  Monitor BP.   Hypothyroidism; continue with synthroid.   CKD stage III. Follow trend.   Hyperkalemia. Repeat bmet prior to sx. Hold ibersartan.     DVT prophylaxis: Lovenox Code Status:full code.  Family Communication: care discussed with patient.  Disposition Plan: for OR today   Consultants:  Ortho   Procedures:  none   Antimicrobials:   none   Subjective: Complaining of ankle pain. denies chest pain, dyspnea.  Objective: Vitals:   11/16/17 2155 11/16/17 2241 11/17/17 0458 11/17/17 1300  BP: (!) 152/68 (!) 160/76 133/62 105/62  Pulse: 95 68 61 62  Resp: 15  12 15   Temp: 98.5 F (36.9 C) 98 F (36.7 C) 97.6 F (36.4 C) (!) 97.1 F (36.2 C)  TempSrc: Oral Oral Oral Oral  SpO2: 95% 97% 99% 98%  Weight:      Height:        Intake/Output Summary (Last 24 hours) at 11/17/2017 1519 Last data filed at 11/17/2017 1440 Gross per 24 hour  Intake 1350 ml    Output 1800 ml  Net -450 ml   Filed Weights   11/16/17 0003  Weight: 70.8 kg (156 lb)    Examination:  General exam: Appears calm and comfortable  Respiratory system: Clear to auscultation. Respiratory effort normal. Cardiovascular system: S1 & S2 heard, RRR. No JVD, murmurs, rubs, gallops or clicks. No pedal edema. Gastrointestinal system: Abdomen is nondistended, soft and nontender. No organomegaly or masses felt. Normal bowel sounds heard. Central nervous system: Alert and oriented. No focal neurological deficits. Extremities: Symmetric 5 x 5 power. Left LE with dressing.  Skin: No rashes, lesions or ulcers Psychiatry: Judgement and insight appear normal. Mood & affect appropriate.     Data Reviewed: I have personally reviewed following labs and imaging studies  CBC: Recent Labs  Lab 11/15/17 2114 11/16/17 0540 11/17/17 0646  WBC 8.2 8.1 6.1  NEUTROABS 6.3  --   --   HGB 11.9* 11.3* 11.2*  HCT 35.9* 34.2* 34.6*  MCV 101.1* 99.7 102.7*  PLT 248 242 672   Basic Metabolic Panel: Recent Labs  Lab 11/15/17 2114 11/16/17 0540 11/16/17 1617 11/17/17 0646 11/17/17 1205  NA 136 135  --  139 137  K 4.1 4.4  --  5.2* 4.9  CL 103 102  --  104 104  CO2 24 25  --  29 28  GLUCOSE 105* 108*  --  96 89  BUN 24* 21*  --  17 14  CREATININE 0.93 0.97  --  1.11* 1.05*  CALCIUM 9.0 8.7*  --  9.5 9.2  MG  --   --  1.8  --   --    GFR: Estimated Creatinine Clearance: 41.2 mL/min (A) (by C-G formula based on SCr of 1.05 mg/dL (H)). Liver Function Tests: Recent Labs  Lab 11/16/17 0540  AST 29  ALT 18  ALKPHOS 62  BILITOT 1.6*  PROT 6.0*  ALBUMIN 3.7   No results for input(s): LIPASE, AMYLASE in the last 168 hours. No results for input(s): AMMONIA in the last 168 hours. Coagulation Profile: No results for input(s): INR, PROTIME in the last 168 hours. Cardiac Enzymes: No results for input(s): CKTOTAL, CKMB, CKMBINDEX, TROPONINI in the last 168 hours. BNP (last 3  results) No results for input(s): PROBNP in the last 8760 hours. HbA1C: No results for input(s): HGBA1C in the last 72 hours. CBG: No results for input(s): GLUCAP in the last 168 hours. Lipid Profile: No results for input(s): CHOL, HDL, LDLCALC, TRIG, CHOLHDL, LDLDIRECT in the last 72 hours. Thyroid Function Tests: No results for input(s): TSH, T4TOTAL, FREET4, T3FREE, THYROIDAB in the last 72 hours. Anemia Panel: No results for input(s): VITAMINB12, FOLATE, FERRITIN, TIBC, IRON, RETICCTPCT in the last 72 hours. Sepsis Labs: No results for input(s): PROCALCITON, LATICACIDVEN in the last 168 hours.  Recent Results (from the past 240 hour(s))  Surgical pcr screen     Status: None   Collection Time: 11/16/17 10:57 PM  Result Value Ref Range Status   MRSA, PCR NEGATIVE NEGATIVE Final   Staphylococcus aureus NEGATIVE NEGATIVE Final    Comment: (NOTE) The Xpert SA Assay (FDA approved for NASAL specimens in patients 24 years of age and older), is one component of a comprehensive surveillance program. It is not intended to diagnose infection nor to guide or monitor treatment. Performed at [facility], [place] [address], [place], [place] [address]          Radiology Studies: Dg Ribs Unilateral W/chest Right  Result Date: 11/15/2017 CLINICAL DATA:  Fall today coming in from garage.  Right rib pain. EXAM: RIGHT RIBS AND CHEST - 3+ VIEW COMPARISON:  Chest radiograph 10/10/2017 FINDINGS: No fracture or other bone lesions are seen involving the right ribs. Remote left rib fracture, as seen on prior exam. There is no evidence of pneumothorax or pleural effusion. Calcified granuloma in the right lung. Heart size and mediastinal contours are within normal limits. IMPRESSION: Negative radiographs of the chest and right ribs. Electronically Signed   By: Jeb Levering M.D.   On: 11/15/2017 21:55   Dg Lumbar Spine Complete  Result Date: 11/15/2017 CLINICAL DATA:  Fall coming in from garage,  lumbosacral back pain. EXAM: LUMBAR SPINE - COMPLETE 4+ VIEW COMPARISON:  Lumbar spine MRI 09/06/2017 FINDINGS: Moderate levo scoliotic curvature of the lumbar spine. Anterolisthesis of L4 on L5 retrolisthesis of L2 on L3 appears degenerative. Remote L1 compression fracture. Remaining lumbar vertebral body heights are preserved. Diffuse disc space narrowing and endplate spurring and facet arthropathy. The bones are under mineralized. Aorto bi-iliac atherosclerosis is incidentally noted. IMPRESSION: 1. No fracture or acute osseous abnormality of the lumbar spine. 2. Scoliosis and multilevel degenerative change. Remote L1 compression fracture. Electronically Signed   By: Jeb Levering M.D.   On: 11/15/2017 21:57   Dg Pelvis 1-2 Views  Result Date: 11/15/2017 CLINICAL DATA:  Fall coming in from garage tonight. EXAM: PELVIS - 1-2 VIEW COMPARISON:  Abdominopelvic CT 05/05/2015 FINDINGS: Remote right pubic body fracture, pubic symphysis is congruent. No acute fracture. Both femoral heads  are well seated in the respective acetabula. Sacroiliac joints are congruent. IMPRESSION: 1. No acute pelvic fracture. 2. Remote right pubic body fracture. Electronically Signed   By: Jeb Levering M.D.   On: 11/15/2017 21:58   Dg Ankle Complete Left  Result Date: 11/15/2017 CLINICAL DATA:  Fall, in from garage.  Left foot and ankle pain. EXAM: LEFT ANKLE COMPLETE - 3+ VIEW COMPARISON:  None. FINDINGS: Displaced avulsion fracture at the posterior calcaneus at the Achilles tendon insertion. Displaced fracture fragment measures 2.4 cm, there is approximately 2.7 cm distraction posteriorly. Apex dorsal angulation. Base of the fifth metatarsal fracture better assessed on concurrent foot radiographs. No additional fracture of the ankle. Prominent plantar calcaneal spur. The ankle mortise is preserved. Diffuse bony under mineralization. IMPRESSION: 1. Displaced posterior calcaneal fracture at the Achilles insertion with 2.7 cm  osseous distraction. 2. Base of the fifth metatarsal fracture, better assessed on concurrent foot radiographs. Electronically Signed   By: Jeb Levering M.D.   On: 11/15/2017 22:05   Ct Ankle Left Wo Contrast  Result Date: 11/16/2017 CLINICAL DATA:  Calcaneus fracture. EXAM: CT OF THE LEFT ANKLE WITHOUT CONTRAST TECHNIQUE: Multidetector CT imaging of the left ankle was performed according to the standard protocol. Multiplanar CT image reconstructions were also generated. COMPARISON:  Radiographs dated 11/15/2017 FINDINGS: Bones/Joint/Cartilage There is a displaced rotated fracture of the posterosuperior aspect of the calcaneus at the Achilles insertion. There is a fracture through the base of the fifth metatarsal with slight distraction. Marked diffuse osteopenia. Muscles and Tendons No discrete abnormality except for proximal retraction of the Achilles tendon due to the avulsion fracture of the posterior calcaneus. No discrete abnormality of the other tendons around the ankle. Soft tissues Multiple small calcifications in the subcutaneous soft tissues of lower leg. Slight soft tissue edema around the ankle and foot. Chronic calcification in the plantar fascia. IMPRESSION: Displaced rotated avulsion fracture of the posterosuperior aspect of the calcaneus at the Achilles insertion. Slightly distracted fracture of the base of the fifth metatarsal. Marked diffuse osteopenia. Electronically Signed   By: Lorriane Shire M.D.   On: 11/16/2017 14:07   Dg Foot Complete Left  Result Date: 11/15/2017 CLINICAL DATA:  Status post fall, with bilateral foot pain. Initial encounter. EXAM: LEFT FOOT - COMPLETE 3+ VIEW COMPARISON:  None. FINDINGS: There is a significantly displaced avulsion fracture at the posterosuperior calcaneus, reflecting avulsion of the Achilles tendon. There appear to be fractures through the base of the fifth metatarsal and base of the fifth proximal phalanx, with underlying lucency, suggesting these  may be subacute in nature. No additional fractures are seen. There is mild osteopenia of visualized osseous structures. An os peroneum is noted. A plantar calcaneal spur is noted. IMPRESSION: 1. Significantly displaced avulsion fracture at the posterosuperior calcaneus, reflecting avulsion of the Achilles tendon. 2. Fractures through the base of the fifth metatarsal and base of the fifth proximal phalanx, with underlying lucency, suggesting these may be subacute in nature. 3. Mild osteopenia of visualized osseous structures. 4. Os peroneum noted. Electronically Signed   By: Garald Balding M.D.   On: 11/15/2017 23:01   Dg Foot Complete Right  Result Date: 11/15/2017 CLINICAL DATA:  Fall coming in from garage tonight. Right foot pain. EXAM: RIGHT FOOT COMPLETE - 3+ VIEW COMPARISON:  None. FINDINGS: Fracture at the base of the fifth metatarsal has sclerotic margins and is likely chronic. Remote fractures of the first, second, and third metatarsal shafts. No acute fracture. Bones significantly under mineralized.  Small plantar calcaneal spur and Achilles tendon enthesophyte. IMPRESSION: 1. Fracture at the base of the fifth metatarsal has sclerotic margins and is likely chronic, recommend correlation with point tenderness. 2. Remote first, second, and third metatarsal shaft fractures. Electronically Signed   By: Jeb Levering M.D.   On: 11/15/2017 22:59        Scheduled Meds: . [MAR Hold] calcium carbonate  1,250 mg Oral Daily  . [MAR Hold] enoxaparin (LOVENOX) injection  40 mg Subcutaneous Q24H  . [MAR Hold] folic acid  1 mg Oral Daily  . [MAR Hold] irbesartan  75 mg Oral Daily  . [MAR Hold] levothyroxine  50 mcg Oral QAC breakfast  . povidone-iodine  2 application Topical Once   Continuous Infusions: . sodium chloride 75 mL/hr at 11/17/17 0514  . [START ON 11/18/2017]  ceFAZolin (ANCEF) IV    . lactated ringers    . lactated ringers 10 mL/hr at 11/17/17 1515     LOS: 2 days    Time spent: 35  minutes.     Elmarie Shiley, MD [facility] Pager [phone]  If 7PM-7AM, please contact night-coverage [email or web address] Password [email or web address] 11/17/2017, 3:19 PM

## 2017-11-17 NOTE — Plan of Care (Signed)
  Problem: Health Behavior/Discharge Planning: Goal: Ability to manage health-related needs will improve Outcome: Progressing   Problem: Clinical Measurements: Goal: Ability to maintain clinical measurements within normal limits will improve Outcome: Progressing   Problem: Clinical Measurements: Goal: Cardiovascular complication will be avoided Outcome: Progressing   Problem: Activity: Goal: Risk for activity intolerance will decrease Outcome: Progressing   Problem: Pain Managment: Goal: General experience of comfort will improve Outcome: Progressing

## 2017-11-17 NOTE — Op Note (Signed)
Jennifer Goodman [id number] 11/17/2017   PRE-OP DIAGNOSIS: left calcaneous avulsion fracture  POST-OP DIAGNOSIS: same and TAR  PROCEDURE: open treatment of calcaneous fracture and TAR  ANESTHESIA: general and block  [name]   Dictation #:  [id number]

## 2017-11-17 NOTE — Op Note (Signed)
Jennifer Goodman, ELDEN MEDICAL RECORD AS:[id number] ACCOUNT [id number] DATE OF BIRTH:1935-02-22 FACILITY: [facility] LOCATION: [facility] PHYSICIAN:Kritika Stukes Darnell Level Zniya Cottone, MD  OPERATIVE REPORT  DATE OF PROCEDURE:  11/17/2017  PREOPERATIVE DIAGNOSIS:  Left calcaneus avulsion fracture.  POSTOPERATIVE DIAGNOSIS:  Left calcaneus avulsion fracture.  PROCEDURE PERFORMED: 1.  Open reduction left calcaneus avulsion fracture. 2.  Left Achilles tendon repair.  ANESTHESIA:  General.  ATTENDING SURGEON:  Melrose Nakayama, MD  ASSISTANT:  Loni Dolly, PA.  INDICATIONS:  The patient is an 83 year old woman who tripped at home a day ago and sustained an avulsion fracture of her Achilles insertion at the heel.  She underwent CT examination and was then cleared by medicine to undergo ORIF as she is a fairly  active individual despite her rheumatoid arthritis and other orthopedic issues.  Informed operative consent was obtained after discussion of possible complications including reaction to anesthesia, infection, wound healing problems.  SUMMARY OF FINDINGS:  Under general anesthesia through a posterolateral incision, we exposed her avulsion fracture.  She did have the fracture of the calcaneus which was a very poor bone.  She also had basically complete avulsion of the Achilles  insertion, which was basically separate from this fragment.  There was very little connection between the Achilles and the bone fragment.  We elected to excise the bone fragment.  We then freshened the Achilles and repaired this directly to the calcaneal  bone on its posterior aspect using a Bio-Corkscrew anchor by [employer] and 3 Bunnell stitches attached to this anchor.  This gave [place] a solid repair.  She was closed primarily and placed into a splint and admitted for overnight observation.  DESCRIPTION OF PROCEDURE:  The patient was taken to the operating suite where general anesthetic was applied.  She was positioned prone with chest roll  used and all bony prominences appropriately padded.  She was then prepped and draped in normal sterile  fashion.  After the administration of preoperative IV Kefzol and appropriate timeout, the left leg was elevated, exsanguinated, and tourniquet inflated to 250 pounds of pressure about the calf.  I then made a posterolateral incision with dissection down  to the heel.  I tried to keep my incision as proximal as possible to not endanger the distal skin of the heel.  The fracture was as described above.  She had several fragments, none of which were large enough or strong enough to tolerate internal  fixation.  I elected to remove these bony fragments.  The tendon was basically avulsed from these fragments already.  I freshened the end by removing some residual bone and then I elected to repair the tendon directly to the defect at the calcaneus.  We  placed a corkscrew anchor by [employer] deep in the calcaneus and this had 3 sutures emanating.  These seemed to be well fixed in the calcaneus.  I then passed these up in Bunnell fashion through the Achilles tendon and utilized all 3 of these with 6 limbs  of suture crossing the repair site.  This seemed to give [place] a stable repair.  We irrigated thoroughly.  The tourniquet was deflated and her skin edges all bled well.  I irrigated again and then reapproximated the skin with vertical mattresses of nylon  with no significant tension on the repair.  We then placed a Xeroform followed by dry gauze and a posterior splint of plaster with the ankle in slight plantar flexion.  ESTIMATED BLOOD LOSS AND FLUIDS:  Taken from anesthesia records, asking  accurate tourniquet time.  DISPOSITION:  The patient was extubated in the operating room and taken to recovery room in stable condition.  She will be admitted back to the hospital with possible discharge home in the morning depending on her ability to mobilize.  TN/NUANCE  D:11/17/2017 T:11/17/2017 JOB:[id number]

## 2017-11-17 NOTE — Anesthesia Procedure Notes (Signed)
Anesthesia Regional Block: Popliteal block   Pre-Anesthetic Checklist: ,, timeout performed, Correct Patient, Correct Site, Correct Laterality, Correct Procedure, Correct Position, site marked, Risks and benefits discussed,  Surgical consent,  Pre-op evaluation,  At surgeon's request and post-op pain management  Laterality: Left and Lower  Prep: Maximum Sterile Barrier Precautions used, chloraprep       Needles:  Injection technique: Single-shot  Needle Type: Echogenic Stimulator Needle     Needle Length: 10cm      Additional Needles:   Procedures:,,,, ultrasound used (permanent image in chart),,,,  Narrative:  Start time: 11/17/2017 3:36 PM End time: 11/17/2017 3:46 PM Injection made incrementally with aspirations every 5 mL.  Performed by: Personally  Anesthesiologist: Montez Hageman, MD  Additional Notes: Risks, benefits and alternative to block explained extensively.  Patient tolerated procedure well, without complications.

## 2017-11-18 ENCOUNTER — Inpatient Hospital Stay (HOSPITAL_COMMUNITY): Payer: Medicare Other

## 2017-11-18 ENCOUNTER — Encounter (HOSPITAL_COMMUNITY): Payer: Self-pay | Admitting: Orthopaedic Surgery

## 2017-11-18 DIAGNOSIS — I4891 Unspecified atrial fibrillation: Secondary | ICD-10-CM

## 2017-11-18 DIAGNOSIS — I480 Paroxysmal atrial fibrillation: Secondary | ICD-10-CM

## 2017-11-18 LAB — BASIC METABOLIC PANEL
ANION GAP: 9 (ref 5–15)
BUN: 19 mg/dL (ref 6–20)
CO2: 23 mmol/L (ref 22–32)
Calcium: 8.5 mg/dL — ABNORMAL LOW (ref 8.9–10.3)
Chloride: 101 mmol/L (ref 101–111)
Creatinine, Ser: 1.09 mg/dL — ABNORMAL HIGH (ref 0.44–1.00)
GFR, EST AFRICAN AMERICAN: 54 mL/min — AB (ref >60)
GFR, EST NON AFRICAN AMERICAN: 47 mL/min — AB (ref >60)
Glucose, Bld: 139 mg/dL — ABNORMAL HIGH (ref 65–99)
POTASSIUM: 4.2 mmol/L (ref 3.5–5.1)
SODIUM: 133 mmol/L — AB (ref 135–145)

## 2017-11-18 LAB — CBC
HCT: 34.3 % — ABNORMAL LOW (ref 36.0–46.0)
Hemoglobin: 11.3 g/dL — ABNORMAL LOW (ref 12.0–15.0)
MCH: 33.6 pg (ref 26.0–34.0)
MCHC: 32.9 g/dL (ref 30.0–36.0)
MCV: 102.1 fL — ABNORMAL HIGH (ref 78.0–100.0)
PLATELETS: 218 10*3/uL (ref 150–400)
RBC: 3.36 MIL/uL — ABNORMAL LOW (ref 3.87–5.11)
RDW: 13.7 % (ref 11.5–15.5)
WBC: 7.9 10*3/uL (ref 4.0–10.5)

## 2017-11-18 MED ORDER — DOCUSATE SODIUM 100 MG PO CAPS
100.0000 mg | ORAL_CAPSULE | Freq: Two times a day (BID) | ORAL | Status: DC
  Administered 2017-11-18 – 2017-11-21 (×7): 100 mg via ORAL
  Filled 2017-11-18 (×7): qty 1

## 2017-11-18 MED ORDER — METOCLOPRAMIDE HCL 5 MG PO TABS: 5.0000 mg | ORAL_TABLET | Freq: Three times a day (TID) | ORAL | Status: DC | PRN

## 2017-11-18 MED ORDER — ONDANSETRON HCL 4 MG PO TABS: 4.0000 mg | ORAL_TABLET | Freq: Four times a day (QID) | ORAL | Status: DC | PRN

## 2017-11-18 MED ORDER — METOCLOPRAMIDE HCL 5 MG/ML IJ SOLN: 5.0000 mg | Freq: Three times a day (TID) | INTRAMUSCULAR | Status: DC | PRN

## 2017-11-18 MED ORDER — HYDROCODONE-ACETAMINOPHEN 5-325 MG PO TABS: 1.0000 | ORAL_TABLET | Freq: Four times a day (QID) | ORAL | 0 refills | Status: DC | PRN

## 2017-11-18 MED ORDER — ONDANSETRON HCL 4 MG/2ML IJ SOLN: 4.0000 mg | Freq: Four times a day (QID) | INTRAMUSCULAR | Status: DC | PRN

## 2017-11-18 MED ORDER — DICLOFENAC SODIUM 1 % TD GEL
2.0000 g | Freq: Four times a day (QID) | TRANSDERMAL | Status: DC
  Administered 2017-11-18 – 2017-11-21 (×5): 2 g via TOPICAL
  Filled 2017-11-18: qty 100

## 2017-11-18 MED ORDER — HYDROCODONE-ACETAMINOPHEN 5-325 MG PO TABS
1.0000 | ORAL_TABLET | Freq: Four times a day (QID) | ORAL | Status: DC | PRN
  Administered 2017-11-18 – 2017-11-21 (×8): 1 via ORAL
  Filled 2017-11-18 (×8): qty 1

## 2017-11-18 MED ORDER — DIPHENHYDRAMINE HCL 12.5 MG/5ML PO ELIX: 12.5000 mg | ORAL_SOLUTION | ORAL | Status: DC | PRN

## 2017-11-18 NOTE — Progress Notes (Signed)
Progress Note  Patient Name: Jennifer Goodman Date of Encounter: 11/18/2017  Primary Cardiologist: No primary care provider on file. New (Aidric Endicott)  Subjective   Feels well. This patients CHA2DS2-VASc Score and unadjusted Ischemic Stroke Rate (% per year) is equal to 4.8 % stroke rate/year from a score of 4  Above score calculated as 1 point each if present [CHF, HTN, DM, Vascular=MI/PAD/Aortic Plaque, Age if 65-74, or Female] Above score calculated as 2 points each if present [Age > 75, or Stroke/TIA/TE]   Inpatient Medications    Scheduled Meds: . calcium carbonate  1,250 mg Oral Daily  . diclofenac sodium  2 g Topical QID  . docusate sodium  100 mg Oral BID  . enoxaparin (LOVENOX) injection  40 mg Subcutaneous Q24H  . folic acid  1 mg Oral Daily  . levothyroxine  50 mcg Oral QAC breakfast   Continuous Infusions: . sodium chloride 75 mL/hr at 11/17/17 2300  . diltiazem (CARDIZEM) infusion Stopped (11/18/17 0000)  . lactated ringers 10 mL/hr at 11/17/17 1515   PRN Meds: diphenhydrAMINE, HYDROcodone-acetaminophen, HYDROmorphone (DILAUDID) injection, methocarbamol, metoCLOPramide **OR** metoCLOPramide (REGLAN) injection, ondansetron **OR** ondansetron (ZOFRAN) IV, polyethylene glycol   Vital Signs    Vitals:   11/18/17 0000 11/18/17 0009 11/18/17 0500 11/18/17 0813  BP: (!) 98/52 (!) 98/52 120/62 (!) 110/53  Pulse:  (!) 56 (!) 57 (!) 54  Resp:  16 17 18   Temp:  97.6 F (36.4 C) (!) 97.5 F (36.4 C) 98 F (36.7 C)  TempSrc:  Oral Oral Oral  SpO2:  98% 99% 97%  Weight:  158 lb 15.2 oz (72.1 kg)    Height:        Intake/Output Summary (Last 24 hours) at 11/18/2017 0936 Last data filed at 11/18/2017 0600 Gross per 24 hour  Intake 1988.79 ml  Output 1900 ml  Net 88.79 ml   Filed Weights   11/16/17 0003 11/18/17 0009  Weight: 156 lb (70.8 kg) 158 lb 15.2 oz (72.1 kg)    Telemetry    NSR since 2100 h last night - Personally Reviewed  ECG    Sinus brady,  otherwise normal - Personally Reviewed  Physical Exam  Appears comfortable GEN: No acute distress.   Neck: No JVD Cardiac: RRR, no murmurs, rubs, or gallops.  Respiratory: Clear to auscultation bilaterally. GI: Soft, nontender, non-distended  MS: No edema; No deformity. Neuro:  Nonfocal  Psych: Normal affect   Labs    Chemistry Recent Labs  Lab 11/16/17 0540 11/17/17 0646 11/17/17 1205  NA 135 139 137  K 4.4 5.2* 4.9  CL 102 104 104  CO2 25 29 28   GLUCOSE 108* 96 89  BUN 21* 17 14  CREATININE 0.97 1.11* 1.05*  CALCIUM 8.7* 9.5 9.2  PROT 6.0*  --   --   ALBUMIN 3.7  --   --   AST 29  --   --   ALT 18  --   --   ALKPHOS 62  --   --   BILITOT 1.6*  --   --   GFRNONAA 54* 46* 49*  GFRAA >60 53* 57*  ANIONGAP 8 6 5      Hematology Recent Labs  Lab 11/15/17 2114 11/16/17 0540 11/17/17 0646  WBC 8.2 8.1 6.1  RBC 3.55* 3.43* 3.37*  HGB 11.9* 11.3* 11.2*  HCT 35.9* 34.2* 34.6*  MCV 101.1* 99.7 102.7*  MCH 33.5 32.9 33.2  MCHC 33.1 33.0 32.4  RDW 14.3 14.3 13.7  PLT 248 242 220    Cardiac EnzymesNo results for input(s): TROPONINI in the last 168 hours. No results for input(s): TROPIPOC in the last 168 hours.   BNPNo results for input(s): BNP, PROBNP in the last 168 hours.   DDimer No results for input(s): DDIMER in the last 168 hours.   Radiology    No results found.  Cardiac Studies   Echo pending  Patient Profile     83 y.o. female with postoperative atrial fibrillation with RVR, spontaneously resolved.  Assessment & Plan    1.AFib: spontaneously resolved. Baseline sinus bradycardia, do not recommend daily rate control or antiarrhythmic meds. Recommend anticoagulation with Eliquis 2.5 mg BID (adjusted for age and small body size) and further outpatient monitoring to assess prevalence of arrhythmia. OK to DC unless echo shows an unexpected structural abnormality.  For questions or updates, please contact [place] Please consult [email or web address]  for contact info under Cardiology/STEMI.      Signed, Sanda Klein, MD  11/18/2017, 9:36 AM

## 2017-11-18 NOTE — Progress Notes (Signed)
Subjective: 1 Day Post-Op Procedure(s) (LRB): OPEN TREATMENT OF CALCANEOUS FRACTURE (Left) ACHILLES TENDON REPAIR (Left)  Patient resting comfortably in bed. She is still getting good relief from her block before surgery yesterday.   Activity level:  Non weightbearing left leg Diet tolerance:  ok Voiding:  ok Patient reports pain as mild.    Objective: Vital signs in last 24 hours: Temp:  [97.1 F (36.2 C)-98.2 F (36.8 C)] 97.5 F (36.4 C) (06/07 0500) Pulse Rate:  [48-125] 57 (06/07 0500) Resp:  [11-24] 17 (06/07 0500) BP: (91-128)/(21-102) 120/62 (06/07 0500) SpO2:  [94 %-100 %] 99 % (06/07 0500) Weight:  [72.1 kg (158 lb 15.2 oz)] 72.1 kg (158 lb 15.2 oz) (06/07 0009)  Labs: Recent Labs    11/15/17 2114 11/16/17 0540 11/17/17 0646  HGB 11.9* 11.3* 11.2*   Recent Labs    11/16/17 0540 11/17/17 0646  WBC 8.1 6.1  RBC 3.43* 3.37*  HCT 34.2* 34.6*  PLT 242 220   Recent Labs    11/17/17 0646 11/17/17 1205  NA 139 137  K 5.2* 4.9  CL 104 104  CO2 29 28  BUN 17 14  CREATININE 1.11* 1.05*  GLUCOSE 96 89  CALCIUM 9.5 9.2   No results for input(s): LABPT, INR in the last 72 hours.  Physical Exam:  Neurologically intact ABD soft Neurovascular intact Sensation intact distally Intact pulses distally Dorsiflexion/Plantar flexion intact Incision: dressing C/D/I and no drainage No cellulitis present Compartment soft  Assessment/Plan:  1 Day Post-Op Procedure(s) (LRB): OPEN TREATMENT OF CALCANEOUS FRACTURE (Left) ACHILLES TENDON REPAIR (Left) Advance diet Up with therapy  Non-weightbearing left leg. She still has some residual numbness and can not move her toes yet from her block yesterday for surgery. She does have good cap refill and her splint is not too tight. We greatly appreciate medical management.  From an orthopaedic standpoint she is cleared for discharge once she has cleared PT. She will follow up with [place] in the office in 7-14days. Norco Rx  printed for pain control.  Jennifer Goodman, Jennifer Goodman 11/18/2017, 7:51 AM

## 2017-11-18 NOTE — NC FL2 (Signed)
[place] LEVEL OF CARE SCREENING TOOL     IDENTIFICATION  Patient Name: Jennifer Goodman Birthdate: [birth year] Sex: female Admission Date (Current Location): 11/15/2017  [facility] and [place] Number:  Herbalist and Address:  [facility], [place] [address] [place], [place] [address]      Provider Number: [id number]  Attending Physician Name and Address:  Elmarie Shiley, MD  Relative Name and Phone Number:  [name], husband, [phone]    Current Level of Care: Hospital Recommended Level of Care: [place] Prior Approval Number:    Date Approved/Denied:   PASRR Number: [id number]  Discharge Plan: SNF    Current Diagnoses: Patient Active Problem List   Diagnosis Date Noted  . Paroxysmal atrial fibrillation (HCC)   . Fall 11/15/2017  . Displaced fracture of fifth metatarsal bone, left foot, initial encounter for closed fracture 11/15/2017  . Achilles tendon avulsion 11/15/2017  . Fracture, foot, left, closed, initial encounter 11/15/2017  . Hypertension, essential, benign 08/24/2017  . Hypothyroidism (acquired) 08/24/2017  . CKD (chronic kidney disease), stage III ([place]) 08/24/2017  . Infection of urinary tract 05/29/2016  . Primary osteoarthritis of right knee 05/28/2016  . Primary osteoarthritis of left knee 06/20/2015  . Osteoporosis 06/20/2015  . Closed displaced fracture of medial condyle of left femur ([place]) 06/20/2015  . Abdominal pain, RLQ (right lower quadrant) 05/19/2012    Orientation RESPIRATION BLADDER Height & Weight     Self, Time, Place, Situation  Normal Incontinent, External catheter Weight: 158 lb 15.2 oz (72.1 kg) Height:  5\' 4"  (162.6 cm)  BEHAVIORAL SYMPTOMS/MOOD NEUROLOGICAL BOWEL NUTRITION STATUS      Continent Diet(see discharge summary)  AMBULATORY STATUS COMMUNICATION OF NEEDS Skin   Extensive Assist Verbally Skin abrasions, Surgical wounds(skin tears on left anterior arm;  surgical incision on left leg with ace wrap and splint)                       Personal Care Assistance Level of Assistance  Bathing, Feeding, Dressing Bathing Assistance: Maximum assistance Feeding assistance: Independent Dressing Assistance: Maximum assistance     Functional Limitations Info  Hearing, Sight, Speech Sight Info: Adequate Hearing Info: Adequate Speech Info: Adequate    SPECIAL CARE FACTORS FREQUENCY  PT (By licensed PT), OT (By licensed OT)     PT Frequency: 5x week OT Frequency: 5x week            Contractures Contractures Info: Not present    Additional Factors Info  Code Status, Allergies Code Status Info: Full Code Allergies Info: OXYCODONE, SULFAMETHOXAZOLE            Current Medications (11/18/2017):  This is the current hospital active medication list Current Facility-Administered Medications  Medication Dose Route Frequency Provider Last Rate Last Dose  . 0.45 % sodium chloride infusion   Intravenous Continuous Loni Dolly, PA-C 75 mL/hr at 11/18/17 1206    . calcium carbonate (OS-CAL - dosed in mg of elemental calcium) tablet 1,250 mg  1,250 mg Oral Daily Loni Dolly, PA-C   1,250 mg at 11/18/17 1037  . diclofenac sodium (VOLTAREN) 1 % transdermal gel 2 g  2 g Topical QID Regalado, Belkys A, MD   2 g at 11/18/17 1706  . diltiazem (CARDIZEM) 100 mg in dextrose 5 % 100 mL (1 mg/mL) infusion  5-15 mg/hr Intravenous Titrated Regalado, Belkys A, MD   Stopped at 11/18/17 0000  . diphenhydrAMINE (BENADRYL) 12.5  MG/5ML elixir 12.5-25 mg  12.5-25 mg Oral Q4H PRN Loni Dolly, PA-C      . docusate sodium (COLACE) capsule 100 mg  100 mg Oral BID Loni Dolly, PA-C   100 mg at 11/18/17 1037  . enoxaparin (LOVENOX) injection 40 mg  40 mg Subcutaneous Q24H Loni Dolly, PA-C   40 mg at 11/18/17 1038  . folic acid (FOLVITE) tablet 1 mg  1 mg Oral Daily Loni Dolly, PA-C   1 mg at 11/18/17 1037  . HYDROcodone-acetaminophen (NORCO/VICODIN) 5-325 MG per  tablet 1 tablet  1 tablet Oral Q6H PRN Loni Dolly, PA-C   1 tablet at 11/18/17 1054  . HYDROmorphone (DILAUDID) injection 1 mg  1 mg Intravenous Q3H PRN Regalado, Belkys A, MD   1 mg at 11/18/17 1337  . lactated ringers infusion   Intravenous Continuous Loni Dolly, PA-C 10 mL/hr at 11/17/17 1515    . levothyroxine (SYNTHROID, LEVOTHROID) tablet 50 mcg  50 mcg Oral QAC breakfast Loni Dolly, PA-C   50 mcg at 11/18/17 1037  . methocarbamol (ROBAXIN) tablet 500 mg  500 mg Oral Q6H PRN Loni Dolly, PA-C   500 mg at 11/18/17 1054  . metoCLOPramide (REGLAN) tablet 5-10 mg  5-10 mg Oral Q8H PRN Loni Dolly, PA-C       Or  . metoCLOPramide (REGLAN) injection 5-10 mg  5-10 mg Intravenous Q8H PRN Loni Dolly, PA-C      . ondansetron (ZOFRAN) tablet 4 mg  4 mg Oral Q6H PRN Loni Dolly, PA-C       Or  . ondansetron (ZOFRAN) injection 4 mg  4 mg Intravenous Q6H PRN Loni Dolly, PA-C      . polyethylene glycol (MIRALAX / GLYCOLAX) packet 17 g  17 g Oral Daily PRN Loni Dolly, PA-C         Discharge Medications: Please see discharge summary for a list of discharge medications.  Relevant Imaging Results:  Relevant Lab Results:   Additional Information SS# [place], [place]

## 2017-11-18 NOTE — Progress Notes (Signed)
  Echocardiogram 2D Echocardiogram has been performed.  Jennifer Goodman 11/18/2017, 5:08 PM

## 2017-11-18 NOTE — Evaluation (Signed)
Physical Therapy Evaluation Patient Details Name: Jennifer Goodman MRN: [id number] DOB: [birth year] Today's Date: 11/18/2017   History of Present Illness  Pt is an 83 year old woman with PMH of HTN, hypothyroidism, OA, osteoporosis, falls, R elbow fx, CKD, B TKA admitted with Achilles tendon avulsion, calcaneous fx, 5th metatarsal fx. s/p surgical repair 11/17/17. Pt with multiple remote fractures on xray including toes, compression fx and pubic body.   Clinical Impression  Pt admitted with above diagnosis. Pt currently with functional limitations due to the deficits listed below (see PT Problem List). Pt was able to scoot pivot to chair with min assist to hold left LE off floor and with recliner arm dropped.  Pt has a three level home which will be inaccessible without a lot of family assist.  Feel that SNF would give pt time to heal and strengthen prior to d/c home.   Pt will benefit from skilled PT to increase their independence and safety with mobility to allow discharge to the venue listed below.      Follow Up Recommendations SNF;Supervision/Assistance - 24 hour    Equipment Recommendations  Hospital bed;Wheelchair (measurements PT);Wheelchair cushion (measurements PT)    Recommendations for Other Services       Precautions / Restrictions Precautions Precautions: Fall Restrictions Weight Bearing Restrictions: Yes LLE Weight Bearing: Non weight bearing      Mobility  Bed Mobility Overal bed mobility: Needs Assistance Bed Mobility: Supine to Sit     Supine to sit: Min guard     General bed mobility comments: HOB up, use of rail, increased time, cues to avoid pushing through L heel  Transfers Overall transfer level: Needs assistance Equipment used: Rolling walker (2 wheeled) Transfers: Sit to/from Stand;Lateral/Scoot Transfers Sit to Stand: +2 physical assistance;Mod assist        Lateral/Scoot Transfers: Min assist General transfer comment: assist to rise and steady,  cues for hand placement, assist of third person to maintain NWB.  Did attempt stand pivot prior to scoot pivot and pt unable to move right LE due to pain in toes per pt.  Second person held pts foot up for NWB during scoot pivot.   Ambulation/Gait                Stairs            Wheelchair Mobility    Modified Rankin (Stroke Patients Only)       Balance Overall balance assessment: Needs assistance Sitting-balance support: No upper extremity supported;Feet supported Sitting balance-Leahy Scale: Fair     Standing balance support: Bilateral upper extremity supported;During functional activity Standing balance-Leahy Scale: Poor Standing balance comment: unable to hop or pivot on L LE to transfer to chair and needed someone to hold up left LE.                              Pertinent Vitals/Pain Pain Assessment: Faces Faces Pain Scale: Hurts even more Pain Location: R side of back Pain Descriptors / Indicators: Aching Pain Intervention(s): Limited activity within patient's tolerance;Monitored during session;Repositioned;Heat applied  VSS   Home Living Family/patient expects to be discharged to:: Private residence Living Arrangements: Spouse/significant other Available Help at Discharge: Family;Available 24 hours/day Type of Home: House Home Access: Stairs to enter Entrance Stairs-Rails: None Entrance Stairs-Number of Steps: 5 Home Layout: Multi-level;1/2 bath on main level Home Equipment: Cane - single point;Walker - 2 wheels      Prior  Function Level of Independence: Independent with assistive device(s)         Comments: sometimes uses a cane at night     Hand Dominance   Dominant Hand: Right    Extremity/Trunk Assessment   Upper Extremity Assessment Upper Extremity Assessment: Defer to OT evaluation    Lower Extremity Assessment Lower Extremity Assessment: LLE deficits/detail LLE Deficits / Details: hip and knee at least 3/5 LLE:  Unable to fully assess due to pain    Cervical / Trunk Assessment Cervical / Trunk Assessment: Other exceptions(hx of compression fx)  Communication   Communication: No difficulties  Cognition Arousal/Alertness: Awake/alert Behavior During Therapy: WFL for tasks assessed/performed Overall Cognitive Status: Within Functional Limits for tasks assessed                                        General Comments      Exercises General Exercises - Lower Extremity Heel Slides: AROM;Both;5 reps;Supine Hip Flexion/Marching: AROM;Both;5 reps;Standing   Assessment/Plan    PT Assessment Patient needs continued PT services  PT Problem List Decreased strength;Decreased activity tolerance;Decreased balance;Decreased mobility;Decreased knowledge of use of DME;Decreased safety awareness;Decreased knowledge of precautions;Cardiopulmonary status limiting activity;Pain       PT Treatment Interventions DME instruction;Gait training;Functional mobility training;Therapeutic activities;Therapeutic exercise;Balance training;Patient/family education;Wheelchair mobility training    PT Goals (Current goals can be found in the Care Plan section)  Acute Rehab PT Goals Patient Stated Goal: to get back to walking/playing golf PT Goal Formulation: With patient Time For Goal Achievement: 12/02/17 Potential to Achieve Goals: Good    Frequency Min 3X/week   Barriers to discharge Inaccessible home environment      Co-evaluation PT/OT/SLP Co-Evaluation/Treatment: Yes Reason for Co-Treatment: For patient/therapist safety PT goals addressed during session: Mobility/safety with mobility OT goals addressed during session: ADL's and self-care       AM-PAC PT "6 Clicks" Daily Activity  Outcome Measure Difficulty turning over in bed (including adjusting bedclothes, sheets and blankets)?: None Difficulty moving from lying on back to sitting on the side of the bed? : A Little Difficulty sitting  down on and standing up from a chair with arms (e.g., wheelchair, bedside commode, etc,.)?: A Lot Help needed moving to and from a bed to chair (including a wheelchair)?: A Lot Help needed walking in hospital room?: Total Help needed climbing 3-5 steps with a railing? : Total 6 Click Score: 13    End of Session Equipment Utilized During Treatment: Gait belt Activity Tolerance: Patient limited by fatigue;Patient limited by pain Patient left: in chair;with call bell/phone within reach;with chair alarm set;with family/visitor present Nurse Communication: Mobility status PT Visit Diagnosis: Unsteadiness on feet (R26.81);Muscle weakness (generalized) (M62.81);Pain Pain - Right/Left: Left Pain - part of body: Leg    Time: 4650-3546 PT Time Calculation (min) (ACUTE ONLY): 43 min   Charges:   PT Evaluation $PT Eval Moderate Complexity: 1 Mod PT Treatments $Therapeutic Activity: 8-22 mins   PT G Codes:        Ignatius Kloos,PT Acute Rehabilitation [phone] (pager)   Denice Paradise 11/18/2017, 4:23 PM

## 2017-11-18 NOTE — Evaluation (Signed)
Occupational Therapy Evaluation Patient Details Name: Jennifer Goodman MRN: [id number] DOB: 02/12/1935 Today's Date: 11/18/2017    History of Present Illness Pt is an 83 year old woman with PMH of HTN, hypothyroidism, OA, osteoporosis, falls, R elbow fx, CKD, B TKA admitted with Achilles tendon avulsion, calcaneous fx, 5th metatarsal fx. s/p surgical repair 11/17/17. Pt with multiple remote fractures on xray including toes, compression fx and pubic body.    Clinical Impression   Pt was independent prior to admission, she used a cane at night when up to the bathroom. Pt presents with back pain, nerve block in L LE still intact. She was unable to pivot or hop on her R LE and required 2 person assist to stand. Pt performed a lateral transfer to drop arm recliner with min assist to maintain L LE NWB status. Pt requires set up to total assist for ADL. Recommending ST rehab in SNF. Pt lives in a multilevel home that is not accessible. Will follow acutely.    Follow Up Recommendations  SNF;Supervision/Assistance - 24 hour(pt may refuse )    Equipment Recommendations  Wheelchair (measurements OT);Wheelchair cushion (measurements OT);Hospital bed(drop arm commode)    Recommendations for Other Services       Precautions / Restrictions Precautions Precautions: Fall Restrictions Weight Bearing Restrictions: Yes LLE Weight Bearing: Non weight bearing      Mobility Bed Mobility Overal bed mobility: Needs Assistance Bed Mobility: Supine to Sit     Supine to sit: Min guard     General bed mobility comments: HOB up, use of rail, increased time, cues to avoid pushing through L heel  Transfers Overall transfer level: Needs assistance Equipment used: Rolling walker (2 wheeled) Transfers: Sit to/from Stand;Lateral/Scoot Transfers Sit to Stand: +2 physical assistance;Mod assist        Lateral/Scoot Transfers: Min assist General transfer comment: assist to rise and steady, cues for hand  placement, assist of third person to maintain NWB    Balance Overall balance assessment: Needs assistance   Sitting balance-Leahy Scale: Fair       Standing balance-Leahy Scale: Poor Standing balance comment: unable to hop or pivot on L LE to transfer to chair                           ADL either performed or assessed with clinical judgement   ADL Overall ADL's : Needs assistance/impaired Eating/Feeding: Set up;Bed level Eating/Feeding Details (indicate cue type and reason): assist to open containers Grooming: Set up;Sitting   Upper Body Bathing: Minimal assistance;Sitting   Lower Body Bathing: Total assistance;+2 for physical assistance;Sit to/from stand   Upper Body Dressing : Set up;Sitting   Lower Body Dressing: Total assistance;+2 for physical assistance;Sit to/from stand               Functional mobility during ADLs: (unable to ambulate, lateral transfer to chair with min assis)       Vision Patient Visual Report: No change from baseline       Perception     Praxis      Pertinent Vitals/Pain Pain Assessment: Faces Faces Pain Scale: Hurts even more Pain Location: R side of back Pain Descriptors / Indicators: Aching Pain Intervention(s): Heat applied;Premedicated before session;Repositioned     Hand Dominance Right   Extremity/Trunk Assessment Upper Extremity Assessment Upper Extremity Assessment: Generalized weakness(arthritic changes in hands)   Lower Extremity Assessment Lower Extremity Assessment: Defer to PT evaluation   Cervical / Trunk  Assessment Cervical / Trunk Assessment: Other exceptions(hx of compression fx)   Communication Communication Communication: No difficulties   Cognition Arousal/Alertness: Awake/alert Behavior During Therapy: WFL for tasks assessed/performed Overall Cognitive Status: Within Functional Limits for tasks assessed                                     General Comments        Exercises     Shoulder Instructions      Home Living Family/patient expects to be discharged to:: Private residence Living Arrangements: Spouse/significant other Available Help at Discharge: Family;Available 24 hours/day Type of Home: House Home Access: Stairs to enter [employer] of Steps: 5 Entrance Stairs-Rails: None Home Layout: Multi-level;1/2 bath on main level         Bathroom Toilet: Standard     Home Equipment: Cane - single point;Walker - 2 wheels          Prior Functioning/Environment Level of Independence: Independent with assistive device(s)        Comments: sometimes uses a cane at night        OT Problem List: Decreased strength;Decreased activity tolerance;Impaired balance (sitting and/or standing);Decreased knowledge of use of DME or AE;Decreased knowledge of precautions;Pain;Impaired UE functional use      OT Treatment/Interventions: Self-care/ADL training;DME and/or AE instruction;Therapeutic exercise;Patient/family education;Balance training;Therapeutic activities    OT Goals(Current goals can be found in the care plan section) Acute Rehab OT Goals Patient Stated Goal: to get back to walking/playing golf OT Goal Formulation: With patient Time For Goal Achievement: 12/02/17 Potential to Achieve Goals: Good ADL Goals Pt Will Perform Lower Body Bathing: with set-up;with adaptive equipment;sitting/lateral leans Pt Will Perform Lower Body Dressing: with set-up;sitting/lateral leans;with adaptive equipment Pt Will Transfer to Toilet: with transfer board;bedside commode;with min guard assist(drop arm) Pt Will Perform Toileting - Clothing Manipulation and hygiene: with set-up;sit to/from stand Pt/caregiver will Perform Home Exercise Program: Increased strength;Both right and left upper extremity;With theraband;Independently;With written HEP provided  OT Frequency: Min 2X/week   Barriers to D/C: Inaccessible home environment           Co-evaluation PT/OT/SLP Co-Evaluation/Treatment: Yes Reason for Co-Treatment: For patient/therapist safety   OT goals addressed during session: ADL's and self-care      AM-PAC PT "6 Clicks" Daily Activity     Outcome Measure Help from another person eating meals?: A Little Help from another person taking care of personal grooming?: A Little Help from another person toileting, which includes using toliet, bedpan, or urinal?: Total Help from another person bathing (including washing, rinsing, drying)?: A Lot Help from another person to put on and taking off regular upper body clothing?: A Little Help from another person to put on and taking off regular lower body clothing?: Total 6 Click Score: 13   End of Session Equipment Utilized During Treatment: Gait belt;Rolling walker Nurse Communication: Mobility status  Activity Tolerance: Patient tolerated treatment well Patient left: in chair;with call bell/phone within reach;with chair alarm set  OT Visit Diagnosis: Unsteadiness on feet (R26.81);Muscle weakness (generalized) (M62.81);Pain                Time: 1335-1404 OT Time Calculation (min): 29 min Charges:  OT General Charges $OT Visit: 1 Visit OT Evaluation $OT Eval Moderate Complexity: 1 Mod G-Codes:     12/12/2017 Nestor Lewandowsky, OTR/L Pager: [phone]  Pragya Lofaso, Haze Boyden December 12, 2017, 2:23 PM

## 2017-11-18 NOTE — Consult Note (Signed)
[facility] CM Primary Care Navigator  11/18/2017  Jennifer Goodman 04-23-1935 [id number]   Met withpatientandhusband [name]) at the bedside to identify possible discharge needs. Patientreports having a "fall at home and unable to move, get up/ walk" that resulted tothis admission/ surgery.(Open Treatment of Calcaneous Fracture and Achilles tendon repair)  Patientendorses Dr. Betty Martinique with [facility] care provider.    [facility].  Patienthas beenmanagingherownmedications at homeusing "pill box" system filled weekly.  Patient reports that she has beendriving prior to admission but husband will be providingtransportationto herdoctors' appointments after discharge.  Patientliveswith husband whowill serve as the primary caregiver at home as stated.   Anticipateddischargeplan isskilled nursing facility (SNF) per therapy recommendation.  Patient and husbandvoiced understanding to call primary care provider's office when she returns back home, for a post discharge follow-upvisit within1- 2weeksor sooner if needs arise. Patient letter (with PCP's contact number) was provided asareminder.   Discussed with patientand husbandregarding [facility] CM services available for health management at home but she states not feeling the need of any services for now since she has been capable of managing her health issues at home so far. Patientexpressed understandingof needto seek referral from primary care provider to [facility] care management if deemed necessary and appropriate for services in thenearfuture.   [facility] care management information was provided for future needs thatshemay have.  Primary care provider's office is listed as providing transition of care (TOC) follow-up.    For additional questions please  contact:  Edwena Felty A. Taneesha Edgin, BSN, RN-BC [facility] PRIMARY CARE Navigator Cell: [phone]

## 2017-11-18 NOTE — Progress Notes (Signed)
PROGRESS NOTE    Jennifer Goodman  ZJI:[id number] DOB: [birth year] DOA: 11/15/2017 PCP: Martin[place], Betty G, MD   Brief Narrative:  83 y.o.femalewith medical history significant ofhypertension, hypothyroidism, osteoarthritis, osteoporosis,previousfalls with multiple orthopedic surgeries who presented to the year today after sustaining mechanical fall at home.     Assessment & Plan:   Principal Problem:   Achilles tendon avulsion Active Problems:   Hypertension, essential, benign   Hypothyroidism (acquired)   CKD (chronic kidney disease), stage III (HCC)   Fall   Displaced fracture of fifth metatarsal bone, left foot, initial encounter for closed fracture   Fracture, foot, left, closed, initial encounter  Achilles tendon avulsion, calcaneus fracture.  CT ankle; Displaced rotated avulsion fracture of the posterosuperior aspect of the calcaneus at the Achilles insertion. Slightly distracted fracture of the base of the fifth metatarsal. -open treatment of calcaneous and TAR.   A fib RVR; post op Now sinus  Cardiology consulted.  Anticoagulation per cardiology, ortho.  On Cardizem Gtt  HTN; hold irbesartan per op and due to hyperkalemia.  Monitor BP.  Now on Cardizem.   Hypothyroidism; continue with synthroid.   CKD stage III. Follow trend. Labs pending for the morning.   Hyperkalemia. Hold ibersartan.  Resolved. Repeat labs this am.   Right hip pain; x ray negative.  Will order Voltaren   DVT prophylaxis: Lovenox Code Status:full code.  Family Communication: care discussed with patient.  Disposition Plan: for OR today   Consultants:  Ortho   Procedures:  none   Antimicrobials:   none   Subjective: She is alert, complaints of right hip soreness. She had X ray negative for fracture. Denies dyspnea, chest pain.     Objective: Vitals:   11/18/17 0000 11/18/17 0009 11/18/17 0500 11/18/17 0813  BP: (!) 98/52 (!) 98/52 120/62 (!) 110/53  Pulse:  (!) 56  (!) 57 (!) 54  Resp:  16 17 18   Temp:  97.6 F (36.4 C) (!) 97.5 F (36.4 C) 98 F (36.7 C)  TempSrc:  Oral Oral Oral  SpO2:  98% 99% 97%  Weight:  72.1 kg (158 lb 15.2 oz)    Height:        Intake/Output Summary (Last 24 hours) at 11/18/2017 0914 Last data filed at 11/18/2017 0600 Gross per 24 hour  Intake 1988.79 ml  Output 1900 ml  Net 88.79 ml   Filed Weights   11/16/17 0003 11/18/17 0009  Weight: 70.8 kg (156 lb) 72.1 kg (158 lb 15.2 oz)    Examination:  General exam: NAD Respiratory system: Normal respiratory effort. CTA Cardiovascular system: S 1, S 2 RRR Gastrointestinal system: BS present, soft, nt Central nervous system:alert, non focal.  Extremities: Symmetric 5 x 5 power. Left LE dressing.  Skin: No rashes, lesions or ulcers Psychiatry: Mood and affect appropriate   Data Reviewed: I have personally reviewed following labs and imaging studies  CBC: Recent Labs  Lab 11/15/17 2114 11/16/17 0540 11/17/17 0646  WBC 8.2 8.1 6.1  NEUTROABS 6.3  --   --   HGB 11.9* 11.3* 11.2*  HCT 35.9* 34.2* 34.6*  MCV 101.1* 99.7 102.7*  PLT 248 242 175   Basic Metabolic Panel: Recent Labs  Lab 11/15/17 2114 11/16/17 0540 11/16/17 1617 11/17/17 0646 11/17/17 1205  NA 136 135  --  139 137  K 4.1 4.4  --  5.2* 4.9  CL 103 102  --  104 104  CO2 24 25  --  29 28  GLUCOSE  105* 108*  --  96 89  BUN 24* 21*  --  17 14  CREATININE 0.93 0.97  --  1.11* 1.05*  CALCIUM 9.0 8.7*  --  9.5 9.2  MG  --   --  1.8  --   --    GFR: Estimated Creatinine Clearance: 41.6 mL/min (A) (by C-G formula based on SCr of 1.05 mg/dL (H)). Liver Function Tests: Recent Labs  Lab 11/16/17 0540  AST 29  ALT 18  ALKPHOS 62  BILITOT 1.6*  PROT 6.0*  ALBUMIN 3.7   No results for input(s): LIPASE, AMYLASE in the last 168 hours. No results for input(s): AMMONIA in the last 168 hours. Coagulation Profile: No results for input(s): INR, PROTIME in the last 168 hours. Cardiac  Enzymes: No results for input(s): CKTOTAL, CKMB, CKMBINDEX, TROPONINI in the last 168 hours. BNP (last 3 results) No results for input(s): PROBNP in the last 8760 hours. HbA1C: No results for input(s): HGBA1C in the last 72 hours. CBG: No results for input(s): GLUCAP in the last 168 hours. Lipid Profile: No results for input(s): CHOL, HDL, LDLCALC, TRIG, CHOLHDL, LDLDIRECT in the last 72 hours. Thyroid Function Tests: No results for input(s): TSH, T4TOTAL, FREET4, T3FREE, THYROIDAB in the last 72 hours. Anemia Panel: No results for input(s): VITAMINB12, FOLATE, FERRITIN, TIBC, IRON, RETICCTPCT in the last 72 hours. Sepsis Labs: No results for input(s): PROCALCITON, LATICACIDVEN in the last 168 hours.  Recent Results (from the past 240 hour(s))  Surgical pcr screen     Status: None   Collection Time: 11/16/17 10:57 PM  Result Value Ref Range Status   MRSA, PCR NEGATIVE NEGATIVE Final   Staphylococcus aureus NEGATIVE NEGATIVE Final    Comment: (NOTE) The Xpert SA Assay (FDA approved for NASAL specimens in patients 36 years of age and older), is one component of a comprehensive surveillance program. It is not intended to diagnose infection nor to guide or monitor treatment. Performed at [facility], [place] [address] [place], [place] [address]          Radiology Studies: Ct Ankle Left Wo Contrast  Result Date: 11/16/2017 CLINICAL DATA:  Calcaneus fracture. EXAM: CT OF THE LEFT ANKLE WITHOUT CONTRAST TECHNIQUE: Multidetector CT imaging of the left ankle was performed according to the standard protocol. Multiplanar CT image reconstructions were also generated. COMPARISON:  Radiographs dated 11/15/2017 FINDINGS: Bones/Joint/Cartilage There is a displaced rotated fracture of the posterosuperior aspect of the calcaneus at the Achilles insertion. There is a fracture through the base of the fifth metatarsal with slight distraction. Marked diffuse osteopenia. Muscles and Tendons No  discrete abnormality except for proximal retraction of the Achilles tendon due to the avulsion fracture of the posterior calcaneus. No discrete abnormality of the other tendons around the ankle. Soft tissues Multiple small calcifications in the subcutaneous soft tissues of lower leg. Slight soft tissue edema around the ankle and foot. Chronic calcification in the plantar fascia. IMPRESSION: Displaced rotated avulsion fracture of the posterosuperior aspect of the calcaneus at the Achilles insertion. Slightly distracted fracture of the base of the fifth metatarsal. Marked diffuse osteopenia. Electronically Signed   By: Lorriane Shire M.D.   On: 11/16/2017 14:07        Scheduled Meds: . calcium carbonate  1,250 mg Oral Daily  . docusate sodium  100 mg Oral BID  . enoxaparin (LOVENOX) injection  40 mg Subcutaneous Q24H  . folic acid  1 mg Oral Daily  . levothyroxine  50 mcg  Oral QAC breakfast   Continuous Infusions: . sodium chloride 75 mL/hr at 11/17/17 2300  . diltiazem (CARDIZEM) infusion Stopped (11/18/17 0000)  . lactated ringers 10 mL/hr at 11/17/17 1515     LOS: 3 days    Time spent: 35 minutes.     Elmarie Shiley, MD [facility] Pager [phone]  If 7PM-7AM, please contact night-coverage [email or web address] Password [facility] 11/18/2017, 9:14 AM

## 2017-11-19 ENCOUNTER — Other Ambulatory Visit: Payer: Self-pay | Admitting: Nurse Practitioner

## 2017-11-19 DIAGNOSIS — I480 Paroxysmal atrial fibrillation: Secondary | ICD-10-CM

## 2017-11-19 LAB — ECHOCARDIOGRAM COMPLETE
HEIGHTINCHES: 64 in
WEIGHTICAEL: 2543.23 [oz_av]

## 2017-11-19 LAB — BASIC METABOLIC PANEL
Anion gap: 10 (ref 5–15)
BUN: 22 mg/dL — AB (ref 6–20)
CHLORIDE: 100 mmol/L — AB (ref 101–111)
CO2: 21 mmol/L — ABNORMAL LOW (ref 22–32)
CREATININE: 1.09 mg/dL — AB (ref 0.44–1.00)
Calcium: 8.4 mg/dL — ABNORMAL LOW (ref 8.9–10.3)
GFR calc Af Amer: 54 mL/min — ABNORMAL LOW (ref >60)
GFR calc non Af Amer: 47 mL/min — ABNORMAL LOW (ref >60)
Glucose, Bld: 75 mg/dL (ref 65–99)
POTASSIUM: 4.6 mmol/L (ref 3.5–5.1)
Sodium: 131 mmol/L — ABNORMAL LOW (ref 135–145)

## 2017-11-19 LAB — CBC
HEMATOCRIT: 35.1 % — AB (ref 36.0–46.0)
Hemoglobin: 10.9 g/dL — ABNORMAL LOW (ref 12.0–15.0)
MCH: 33.6 pg (ref 26.0–34.0)
MCHC: 31.1 g/dL (ref 30.0–36.0)
MCV: 108.3 fL — AB (ref 78.0–100.0)
PLATELETS: 212 10*3/uL (ref 150–400)
RBC: 3.24 MIL/uL — ABNORMAL LOW (ref 3.87–5.11)
RDW: 14 % (ref 11.5–15.5)
WBC: 8.1 10*3/uL (ref 4.0–10.5)

## 2017-11-19 MED ORDER — POLYETHYLENE GLYCOL 3350 17 G PO PACK: 17.0000 g | PACK | Freq: Every day | ORAL | 0 refills | Status: DC | PRN

## 2017-11-19 MED ORDER — DOCUSATE SODIUM 100 MG PO CAPS: 100.0000 mg | ORAL_CAPSULE | Freq: Two times a day (BID) | ORAL | 0 refills | Status: AC

## 2017-11-19 MED ORDER — APIXABAN 5 MG PO TABS
5.0000 mg | ORAL_TABLET | Freq: Two times a day (BID) | ORAL | Status: DC
  Administered 2017-11-19 – 2017-11-21 (×5): 5 mg via ORAL
  Filled 2017-11-19 (×5): qty 1

## 2017-11-19 NOTE — Clinical Social Work Note (Signed)
Clinical Social Work Assessment  Patient Details  Name: Jennifer Goodman MRN: [id number] Date of Birth: 12-08-1934  Date of referral:  11/19/17               Reason for consult:  Facility Placement                Permission sought to share information with:  Facility Sport and exercise psychologist, Family Supports Permission granted to share information::  Yes, Release of Information Signed  Name::     Jennifer Goodman  Agency::  yes  Relationship::  Husband  Contact Information:  yes  Housing/Transportation Living arrangements for the past 2 months:  [place] of Information:  Patient Patient Interpreter Needed:  None Criminal Activity/Legal Involvement Pertinent to Current Situation/Hospitalization:  No - Comment as needed Significant Relationships:  Spouse Lives with:  Spouse Do you feel safe going back to the place where you live?  Yes Need for family participation in patient care:  No (Coment)  Care giving concerns:  CSW received consult regarding SNF placement.  CSW spoke with patient at bedside.  Patient resides at home with her spouse.  Patient would like to received PT at SNF until she is stronger to return home.   Social Worker assessment / plan:  CSW spoke with patient regarding SNF placement.   Patient resides at home with her spouse.  Patient's bedroom is on the 2nd floor of home.  Patient would like to receive PT from SNF and return home when stronger.  Patient is agreeable to SNF placement to receive PT services.  Employment status:  Retired Advertising copywriter PT Recommendations:  [place], [place] / Referral to community resources:  (NA)  Patient/Family's Response to care:  Patient reports agreement with discharge plan.  Patient/Family's Understanding of and Emotional Response to Diagnosis, Current Treatment, and Prognosis:  Patient is realistic regarding therapy needs and expressed being hopeful for SNF  placement.  Patient expressed understanding of CSW role and discharge process as well as medical condition.  No questions or concerns about plan or treatment.  Emotional Assessment Appearance:  Appears stated age Attitude/Demeanor/Rapport:  Engaged, Gracious Affect (typically observed):  Accepting, Appropriate Orientation:  Oriented to Self, Oriented to Place, Oriented to  Time, Oriented to Situation Alcohol / Substance use:  Not Applicable Psych involvement (Current and /or in the community):  No (Comment)  Discharge Needs  Concerns to be addressed:  Discharge Planning Concerns Readmission within the last 30 days:  No Current discharge risk:  None Barriers to Discharge:  Continued Medical Work up   [employer], [place] 11/19/2017, 11:39 AM

## 2017-11-19 NOTE — Plan of Care (Signed)
  Problem: Coping: Goal: Level of anxiety will decrease Outcome: Progressing   Problem: Pain Managment: Goal: General experience of comfort will improve Outcome: Progressing   Problem: Skin Integrity: Goal: Risk for impaired skin integrity will decrease Outcome: Progressing   

## 2017-11-19 NOTE — Progress Notes (Signed)
PROGRESS NOTE    Jennifer Goodman  [id number]:[id number] DOB: December 21, 1934 DOA: 11/15/2017 PCP: [place], Betty G, MD   Brief Narrative:  83 y.o.femalewith medical history significant ofhypertension, hypothyroidism, osteoarthritis, osteoporosis,previousfalls with multiple orthopedic surgeries who presented to the year today after sustaining mechanical fall at home.     Assessment & Plan:   Principal Problem:   Achilles tendon avulsion Active Problems:   Hypertension, essential, benign   Hypothyroidism (acquired)   CKD (chronic kidney disease), stage III (HCC)   Fall   Displaced fracture of fifth metatarsal bone, left foot, initial encounter for closed fracture   Fracture, foot, left, closed, initial encounter   Paroxysmal atrial fibrillation (HCC)  Achilles tendon avulsion, calcaneus fracture.  CT ankle; Displaced rotated avulsion fracture of the posterosuperior aspect of the calcaneus at the Achilles insertion. Slightly distracted fracture of the base of the fifth metatarsal. -open treatment of calcaneous and TAR.  Stable.   A fib RVR; post op Now sinus  Cardiology consulted.  Anticoagulation per cardiology, ortho.  She was initially on Cardizem gtt.  Now on eliquis   HTN; hold irbesartan per op and due to hyperkalemia.  Monitor BP.    Hypothyroidism; continue with synthroid.   CKD stage III. Follow trend. Stable.   Hyperkalemia. Hold ibersartan.  Resolved. Repeat labs this am.   Right hip pain; x ray negative.   Voltaren PRN  DVT prophylaxis: Lovenox Code Status:full code.  Family Communication: care discussed with patient.  Disposition Plan: SNF when bed available.   Consultants:  Ortho   Procedures:  none   Antimicrobials:   none   Subjective: She is feeling well, denies dyspnea. Mild foot pain   Objective: Vitals:   11/18/17 1712 11/18/17 2026 11/19/17 0523 11/19/17 1310  BP: (!) 118/54 (!) 98/52 (!) 136/55 (!) 128/53  Pulse: (!) 59 (!) 59 60  (!) 59  Resp: 18 20 18 20   Temp: 97.6 F (36.4 C) 97.8 F (36.6 C) (!) 97.4 F (36.3 C) 98 F (36.7 C)  TempSrc: Oral Oral Oral Oral  SpO2: 100% 99% 99% 100%  Weight:      Height:        Intake/Output Summary (Last 24 hours) at 11/19/2017 1440 Last data filed at 11/19/2017 1100 Gross per 24 hour  Intake 2066.25 ml  Output 2000 ml  Net 66.25 ml   Filed Weights   11/16/17 0003 11/18/17 0009  Weight: 70.8 kg (156 lb) 72.1 kg (158 lb 15.2 oz)    Examination:  General exam:NAD Respiratory system: CTA Cardiovascular system: S 1, S 2 RRR Gastrointestinal system: BS present, NT Central nervous system: Alert, non focal.  Extremities: Symmetric 5 x 5 power. Left leg with dressing.  Skin: no rashes.  Psychiatry: Mood and affect appropriate    Data Reviewed: I have personally reviewed following labs and imaging studies  CBC: Recent Labs  Lab 11/15/17 2114 11/16/17 0540 11/17/17 0646 11/18/17 0917 11/19/17 1009  WBC 8.2 8.1 6.1 7.9 8.1  NEUTROABS 6.3  --   --   --   --   HGB 11.9* 11.3* 11.2* 11.3* 10.9*  HCT 35.9* 34.2* 34.6* 34.3* 35.1*  MCV 101.1* 99.7 102.7* 102.1* 108.3*  PLT 248 242 220 218 532   Basic Metabolic Panel: Recent Labs  Lab 11/16/17 0540 11/16/17 1617 11/17/17 0646 11/17/17 1205 11/18/17 0917 11/19/17 0809  NA 135  --  139 137 133* 131*  K 4.4  --  5.2* 4.9 4.2 4.6  CL 102  --  104 104 101 100*  CO2 25  --  29 28 23  21*  GLUCOSE 108*  --  96 89 139* 75  BUN 21*  --  17 14 19  22*  CREATININE 0.97  --  1.11* 1.05* 1.09* 1.09*  CALCIUM 8.7*  --  9.5 9.2 8.5* 8.4*  MG  --  1.8  --   --   --   --    GFR: Estimated Creatinine Clearance: 40.1 mL/min (A) (by C-G formula based on SCr of 1.09 mg/dL (H)). Liver Function Tests: Recent Labs  Lab 11/16/17 0540  AST 29  ALT 18  ALKPHOS 62  BILITOT 1.6*  PROT 6.0*  ALBUMIN 3.7   No results for input(s): LIPASE, AMYLASE in the last 168 hours. No results for input(s): AMMONIA in the last 168  hours. Coagulation Profile: No results for input(s): INR, PROTIME in the last 168 hours. Cardiac Enzymes: No results for input(s): CKTOTAL, CKMB, CKMBINDEX, TROPONINI in the last 168 hours. BNP (last 3 results) No results for input(s): PROBNP in the last 8760 hours. HbA1C: No results for input(s): HGBA1C in the last 72 hours. CBG: No results for input(s): GLUCAP in the last 168 hours. Lipid Profile: No results for input(s): CHOL, HDL, LDLCALC, TRIG, CHOLHDL, LDLDIRECT in the last 72 hours. Thyroid Function Tests: No results for input(s): TSH, T4TOTAL, FREET4, T3FREE, THYROIDAB in the last 72 hours. Anemia Panel: No results for input(s): VITAMINB12, FOLATE, FERRITIN, TIBC, IRON, RETICCTPCT in the last 72 hours. Sepsis Labs: No results for input(s): PROCALCITON, LATICACIDVEN in the last 168 hours.  Recent Results (from the past 240 hour(s))  Surgical pcr screen     Status: None   Collection Time: 11/16/17 10:57 PM  Result Value Ref Range Status   MRSA, PCR NEGATIVE NEGATIVE Final   Staphylococcus aureus NEGATIVE NEGATIVE Final    Comment: (NOTE) The Xpert SA Assay (FDA approved for NASAL specimens in patients 15 years of age and older), is one component of a comprehensive surveillance program. It is not intended to diagnose infection nor to guide or monitor treatment. Performed at [facility], [place] [address] [place], [place] [address]          Radiology Studies: No results found.      Scheduled Meds: . apixaban  5 mg Oral BID  . calcium carbonate  1,250 mg Oral Daily  . diclofenac sodium  2 g Topical QID  . docusate sodium  100 mg Oral BID  . folic acid  1 mg Oral Daily  . levothyroxine  50 mcg Oral QAC breakfast   Continuous Infusions: . lactated ringers 10 mL/hr at 11/17/17 1515     LOS: 4 days    Time spent: 35 minutes.     Elmarie Shiley, MD [facility] Pager [phone]  If 7PM-7AM, please contact  night-coverage [email or web address] Password [email or web address] 11/19/2017, 2:40 PM

## 2017-11-19 NOTE — Progress Notes (Signed)
No recurrent atrial fibrillation. Mildly dilated left atrium, otherwise normal echo. Plan outpatient event monitor (we will schedule), otherwise ready for DC from Cardiology standpoint. Sanda Klein, MD, [facility] [phone] office [phone] pager

## 2017-11-19 NOTE — Progress Notes (Signed)
CSW gave patient list of SNF options.  Will let CSW know of SNF choices possibly this weekend after speaking with spouse.  Reed Breech LCSWA [phone]

## 2017-11-19 NOTE — Progress Notes (Signed)
ANTICOAGULATION CONSULT NOTE - Follow Up Consult  Pharmacy Consult for apixaban Indication: atrial fibrillation  Allergies  Allergen Reactions  . Oxycodone Other (See Comments)    Makes hallucinates  . Sulfamethoxazole Nausea Only    Patient Measurements: Height: 5\' 4"  (162.6 cm) Weight: 158 lb 15.2 oz (72.1 kg) IBW/kg (Calculated) : 54.7   Vital Signs: Temp: 97.4 F (36.3 C) (06/08 0523) Temp Source: Oral (06/08 0523) BP: 136/55 (06/08 0523) Pulse Rate: 60 (06/08 0523)  Labs: Recent Labs    11/17/17 0646 11/17/17 1205 11/18/17 0917  HGB 11.2*  --  11.3*  HCT 34.6*  --  34.3*  PLT 220  --  218  CREATININE 1.11* 1.05* 1.09*    Estimated Creatinine Clearance: 40.1 mL/min (A) (by C-G formula based on SCr of 1.09 mg/dL (H)).   Assessment: Pharmacy consulted to begin apixaban 5mg  PO BID for afib. Noted no anticoagulation prior to admission. Hemoglobin 11.3, platelets stable.   Plan:  Start apixaban 5mg  PO BID  Monitor CBC  Monitor for S/Sx of bleeding   Jalene Mullet, Pharm.D. PGY1 Pharmacy Resident 11/19/2017 9:06 AM Main Pharmacy: [phone]

## 2017-11-19 NOTE — Progress Notes (Addendum)
Subjective: 2 Days Post-Op Procedure(s) (LRB): OPEN TREATMENT OF CALCANEOUS FRACTURE (Left) ACHILLES TENDON REPAIR (Left)  Patient resting comfortably in bed. Reports block mostly resolved, with some numbness and burning feeling on plantar surface of foot  Activity level:  Non weightbearing left leg Diet tolerance:  ok Voiding:  ok Patient reports pain as mild.    Objective: Vital signs in last 24 hours: Temp:  [97.4 F (36.3 C)-98 F (36.7 C)] 97.4 F (36.3 C) (06/08 0523) Pulse Rate:  [54-60] 60 (06/08 0523) Resp:  [18-20] 18 (06/08 0523) BP: (98-136)/(52-55) 136/55 (06/08 0523) SpO2:  [97 %-100 %] 99 % (06/08 0523)  Labs: Recent Labs    11/17/17 0646 11/18/17 0917  HGB 11.2* 11.3*   Recent Labs    11/17/17 0646 11/18/17 0917  WBC 6.1 7.9  RBC 3.37* 3.36*  HCT 34.6* 34.3*  PLT 220 218   Recent Labs    11/17/17 1205 11/18/17 0917  NA 137 133*  K 4.9 4.2  CL 104 101  CO2 28 23  BUN 14 19  CREATININE 1.05* 1.09*  GLUCOSE 89 139*  CALCIUM 9.2 8.5*   No results for input(s): LABPT, INR in the last 72 hours.  Physical Exam:  intact LT dorsal and 1web.  With LT on plantar surface, reports feeling a mild burning feeling +flex/ext toes Toes not markedly swollen, good CR, warm  Assessment/Plan:  2 Days Post-Op Procedure(s) (LRB): OPEN TREATMENT OF CALCANEOUS FRACTURE (Left) ACHILLES TENDON REPAIR (Left)  Non-weightbearing left leg. She still has some residual plantar altered sensibility.  Could be neurapraxia vs. Unresolved block--will re-check at first postop visit. She does have good cap refill and her splint is not too tight. We greatly appreciate medical management.  From an orthopaedic standpoint she is cleared for discharge. She will follow up with Dr. Rhona Raider in the office in 7-14 days. Norco Rx printed for pain control.  Jolyn Nap 11/19/2017, 7:47 AM

## 2017-11-20 NOTE — Progress Notes (Signed)
PROGRESS NOTE    Jennifer Goodman  PJA:[id number] DOB: January 18, 1935 DOA: 11/15/2017 PCP: [place], Betty G, MD   Brief Narrative:  83 y.o.femalewith medical history significant ofhypertension, hypothyroidism, osteoarthritis, osteoporosis,previousfalls with multiple orthopedic surgeries who presented to the year today after sustaining mechanical fall at home.    Assessment & Plan:   Principal Problem:   Achilles tendon avulsion Active Problems:   Hypertension, essential, benign   Hypothyroidism (acquired)   CKD (chronic kidney disease), stage III (HCC)   Fall   Displaced fracture of fifth metatarsal bone, left foot, initial encounter for closed fracture   Fracture, foot, left, closed, initial encounter   Paroxysmal atrial fibrillation (HCC)  Achilles tendon avulsion, calcaneus fracture.  CT ankle; Displaced rotated avulsion fracture of the posterosuperior aspect of the calcaneus at the Achilles insertion. Slightly distracted fracture of the base of the fifth metatarsal. -Open treatment of calcaneous and TAR.  -Stable.   A fib RVR; post op Now sinus  Cardiology consulted.  Anticoagulation per cardiology, ortho.  She was initially on Cardizem gtt. Now off Cardizem.  Started on  eliquis   HTN; hold irbesartan per op and due to hyperkalemia.  Monitor BP.    Hypothyroidism; continue with synthroid.   CKD stage III. Follow trend. Stable.   Hyperkalemia. Hold ibersartan.  Resolved. Repeat labs this am.   Right hip pain; x ray negative.   Voltaren PRN  DVT prophylaxis: Lovenox Code Status:full code.  Family Communication: care discussed with patient.  Disposition Plan: SNF when bed available.   Consultants:  Ortho   Procedures:  none   Antimicrobials:   none   Subjective: Denies dyspnea, chest pain.    Objective: Vitals:   11/19/17 1310 11/19/17 1515 11/20/17 0456 11/20/17 1612  BP: (!) 128/53 122/71 137/60 (!) 141/68  Pulse: (!) 59   64  Resp: 20    18  Temp: 98 F (36.7 C)  97.7 F (36.5 C) 97.6 F (36.4 C)  TempSrc: Oral  Oral Oral  SpO2: 100%   99%  Weight:   72.7 kg (160 lb 4.4 oz)   Height:        Intake/Output Summary (Last 24 hours) at 11/20/2017 1749 Last data filed at 11/20/2017 1614 Gross per 24 hour  Intake 480 ml  Output 3800 ml  Net -3320 ml   Filed Weights   11/16/17 0003 11/18/17 0009 11/20/17 0456  Weight: 70.8 kg (156 lb) 72.1 kg (158 lb 15.2 oz) 72.7 kg (160 lb 4.4 oz)    Examination:  General exam:NAD Respiratory system: CTA Cardiovascular system: S 1, S 2 RRR Gastrointestinal system: BS present , soft, nt Central nervous system: Non focal.  Extremities: Symmetric 5 x 5 power. Left leg with dressing, Skin: no rashes.  Psychiatry: Mood and affect appropriate    Data Reviewed: I have personally reviewed following labs and imaging studies  CBC: Recent Labs  Lab 11/15/17 2114 11/16/17 0540 11/17/17 0646 11/18/17 0917 11/19/17 1009  WBC 8.2 8.1 6.1 7.9 8.1  NEUTROABS 6.3  --   --   --   --   HGB 11.9* 11.3* 11.2* 11.3* 10.9*  HCT 35.9* 34.2* 34.6* 34.3* 35.1*  MCV 101.1* 99.7 102.7* 102.1* 108.3*  PLT 248 242 220 218 341   Basic Metabolic Panel: Recent Labs  Lab 11/16/17 0540 11/16/17 1617 11/17/17 0646 11/17/17 1205 11/18/17 0917 11/19/17 0809  NA 135  --  139 137 133* 131*  K 4.4  --  5.2* 4.9 4.2 4.6  CL 102  --  104 104 101 100*  CO2 25  --  29 28 23  21*  GLUCOSE 108*  --  96 89 139* 75  BUN 21*  --  17 14 19  22*  CREATININE 0.97  --  1.11* 1.05* 1.09* 1.09*  CALCIUM 8.7*  --  9.5 9.2 8.5* 8.4*  MG  --  1.8  --   --   --   --    GFR: Estimated Creatinine Clearance: 40.2 mL/min (A) (by C-G formula based on SCr of 1.09 mg/dL (H)). Liver Function Tests: Recent Labs  Lab 11/16/17 0540  AST 29  ALT 18  ALKPHOS 62  BILITOT 1.6*  PROT 6.0*  ALBUMIN 3.7   No results for input(s): LIPASE, AMYLASE in the last 168 hours. No results for input(s): AMMONIA in the last 168  hours. Coagulation Profile: No results for input(s): INR, PROTIME in the last 168 hours. Cardiac Enzymes: No results for input(s): CKTOTAL, CKMB, CKMBINDEX, TROPONINI in the last 168 hours. BNP (last 3 results) No results for input(s): PROBNP in the last 8760 hours. HbA1C: No results for input(s): HGBA1C in the last 72 hours. CBG: No results for input(s): GLUCAP in the last 168 hours. Lipid Profile: No results for input(s): CHOL, HDL, LDLCALC, TRIG, CHOLHDL, LDLDIRECT in the last 72 hours. Thyroid Function Tests: No results for input(s): TSH, T4TOTAL, FREET4, T3FREE, THYROIDAB in the last 72 hours. Anemia Panel: No results for input(s): VITAMINB12, FOLATE, FERRITIN, TIBC, IRON, RETICCTPCT in the last 72 hours. Sepsis Labs: No results for input(s): PROCALCITON, LATICACIDVEN in the last 168 hours.  Recent Results (from the past 240 hour(s))  Surgical pcr screen     Status: None   Collection Time: 11/16/17 10:57 PM  Result Value Ref Range Status   MRSA, PCR NEGATIVE NEGATIVE Final   Staphylococcus aureus NEGATIVE NEGATIVE Final    Comment: (NOTE) The Xpert SA Assay (FDA approved for NASAL specimens in patients 87 years of age and older), is one component of a comprehensive surveillance program. It is not intended to diagnose infection nor to guide or monitor treatment. Performed at [facility], [place] [address] [place], [place] [address]          Radiology Studies: No results found.      Scheduled Meds: . apixaban  5 mg Oral BID  . calcium carbonate  1,250 mg Oral Daily  . diclofenac sodium  2 g Topical QID  . docusate sodium  100 mg Oral BID  . folic acid  1 mg Oral Daily  . levothyroxine  50 mcg Oral QAC breakfast   Continuous Infusions: . lactated ringers 10 mL/hr at 11/17/17 1515     LOS: 5 days    Time spent: 35 minutes.     Elmarie Shiley, MD [facility] Pager [phone]  If 7PM-7AM, please contact  night-coverage [email or web address] Password [facility] 11/20/2017, 5:49 PM

## 2017-11-20 NOTE — Discharge Instructions (Signed)

## 2017-11-20 NOTE — Progress Notes (Signed)
Paged on call Social Worker to inform that pt has chosen several facilities.

## 2017-11-20 NOTE — Progress Notes (Signed)
Subjective: 3 Days Post-Op Procedure(s) (LRB): OPEN TREATMENT OF CALCANEOUS FRACTURE (Left) ACHILLES TENDON REPAIR (Left)  Patient resting comfortably in bed. Reports didn't receive PT yesterday.  Presently awaiting SNF placement, now on Eliquis  Activity level:  Non weightbearing left leg Diet tolerance:  ok Voiding:  ok Patient reports pain as mild.    Objective: Vital signs in last 24 hours: Temp:  [97.7 F (36.5 C)-98 F (36.7 C)] 97.7 F (36.5 C) (06/09 0456) Pulse Rate:  [59] 59 (06/08 1310) Resp:  [20] 20 (06/08 1310) BP: (122-137)/(53-71) 137/60 (06/09 0456) SpO2:  [100 %] 100 % (06/08 1310) Weight:  [72.7 kg (160 lb 4.4 oz)] 72.7 kg (160 lb 4.4 oz) (06/09 0456)  Labs: Recent Labs    11/18/17 0917 11/19/17 1009  HGB 11.3* 10.9*   Recent Labs    11/18/17 0917 11/19/17 1009  WBC 7.9 8.1  RBC 3.36* 3.24*  HCT 34.3* 35.1*  PLT 218 212   Recent Labs    11/18/17 0917 11/19/17 0809  NA 133* 131*  K 4.2 4.6  CL 101 100*  CO2 23 21*  BUN 19 22*  CREATININE 1.09* 1.09*  GLUCOSE 139* 75  CALCIUM 8.5* 8.4*   No results for input(s): LABPT, INR in the last 72 hours.  Physical Exam:  intact LT dorsal/plantar/1web +flex/ext toes Toes not markedly swollen, good CR, warm Calf soft  Assessment/Plan:  3 Days Post-Op Procedure(s) (LRB): OPEN TREATMENT OF CALCANEOUS FRACTURE (Left) ACHILLES TENDON REPAIR (Left)  Non-weightbearing left leg. Now with intact LT sens thruout. She does have good cap refill and her splint is not too tight.  From an orthopaedic standpoint she is cleared for discharge. She will follow up with Dr. Rhona Raider in the office in 7-14 days.   Jolyn Nap 11/20/2017, 9:58 AM

## 2017-11-20 NOTE — Plan of Care (Signed)
  Problem: Health Behavior/Discharge Planning: Goal: Ability to manage health-related needs will improve Outcome: Progressing   Problem: Clinical Measurements: Goal: Ability to maintain clinical measurements within normal limits will improve Outcome: Progressing Goal: Will remain free from infection Outcome: Progressing Goal: Diagnostic test results will improve Outcome: Progressing Goal: Respiratory complications will improve Outcome: Progressing Goal: Cardiovascular complication will be avoided Outcome: Progressing   Problem: Activity: Goal: Risk for activity intolerance will decrease Outcome: Progressing   Problem: Nutrition: Goal: Adequate nutrition will be maintained Outcome: Progressing   Problem: Coping: Goal: Level of anxiety will decrease Outcome: Progressing   Problem: Elimination: Goal: Will not experience complications related to bowel motility Outcome: Progressing Goal: Will not experience complications related to urinary retention Outcome: Progressing   Problem: Pain Managment: Goal: General experience of comfort will improve Outcome: Progressing   Problem: Safety: Goal: Ability to remain free from injury will improve Outcome: Progressing   Problem: Skin Integrity: Goal: Risk for impaired skin integrity will decrease Outcome: Progressing   Problem: Education: Goal: Knowledge of disease or condition will improve Outcome: Progressing Goal: Understanding of medication regimen will improve Outcome: Progressing   Problem: Activity: Goal: Ability to tolerate increased activity will improve Outcome: Progressing   Problem: Cardiac: Goal: Ability to achieve and maintain adequate cardiopulmonary perfusion will improve Outcome: Progressing   Problem: Health Behavior/Discharge Planning: Goal: Ability to safely manage health-related needs after discharge will improve Outcome: Progressing   

## 2017-11-21 DIAGNOSIS — R2681 Unsteadiness on feet: Secondary | ICD-10-CM | POA: Diagnosis not present

## 2017-11-21 DIAGNOSIS — Z7401 Bed confinement status: Secondary | ICD-10-CM | POA: Diagnosis not present

## 2017-11-21 DIAGNOSIS — S86019A Strain of unspecified Achilles tendon, initial encounter: Secondary | ICD-10-CM | POA: Diagnosis not present

## 2017-11-21 DIAGNOSIS — S86012A Strain of left Achilles tendon, initial encounter: Secondary | ICD-10-CM | POA: Diagnosis not present

## 2017-11-21 DIAGNOSIS — M6281 Muscle weakness (generalized): Secondary | ICD-10-CM | POA: Diagnosis not present

## 2017-11-21 DIAGNOSIS — M2550 Pain in unspecified joint: Secondary | ICD-10-CM | POA: Diagnosis not present

## 2017-11-21 DIAGNOSIS — W19XXXD Unspecified fall, subsequent encounter: Secondary | ICD-10-CM | POA: Diagnosis not present

## 2017-11-21 DIAGNOSIS — I480 Paroxysmal atrial fibrillation: Secondary | ICD-10-CM | POA: Diagnosis not present

## 2017-11-21 DIAGNOSIS — M6389 Disorders of muscle in diseases classified elsewhere, multiple sites: Secondary | ICD-10-CM | POA: Diagnosis not present

## 2017-11-21 DIAGNOSIS — E038 Other specified hypothyroidism: Secondary | ICD-10-CM | POA: Diagnosis not present

## 2017-11-21 DIAGNOSIS — S92032D Displaced avulsion fracture of tuberosity of left calcaneus, subsequent encounter for fracture with routine healing: Secondary | ICD-10-CM | POA: Diagnosis not present

## 2017-11-21 DIAGNOSIS — D8989 Other specified disorders involving the immune mechanism, not elsewhere classified: Secondary | ICD-10-CM | POA: Diagnosis not present

## 2017-11-21 DIAGNOSIS — M79672 Pain in left foot: Secondary | ICD-10-CM | POA: Diagnosis not present

## 2017-11-21 DIAGNOSIS — S92002A Unspecified fracture of left calcaneus, initial encounter for closed fracture: Secondary | ICD-10-CM | POA: Diagnosis not present

## 2017-11-21 DIAGNOSIS — S20211D Contusion of right front wall of thorax, subsequent encounter: Secondary | ICD-10-CM | POA: Diagnosis not present

## 2017-11-21 DIAGNOSIS — M059 Rheumatoid arthritis with rheumatoid factor, unspecified: Secondary | ICD-10-CM | POA: Diagnosis not present

## 2017-11-21 MED ORDER — METHOTREXATE 2.5 MG PO TABS
20.0000 mg | ORAL_TABLET | ORAL | Status: DC
  Administered 2017-11-21: 20 mg via ORAL
  Filled 2017-11-21 (×2): qty 8

## 2017-11-21 MED ORDER — APIXABAN 5 MG PO TABS: 5.0000 mg | ORAL_TABLET | Freq: Two times a day (BID) | ORAL | 0 refills | Status: DC

## 2017-11-21 NOTE — Progress Notes (Signed)
Physical Therapy Treatment Patient Details Name: Jennifer Goodman MRN: [id number] DOB: 1934/12/28 Today's Date: 11/21/2017    History of Present Illness Pt is an 83 year old woman with PMH of HTN, hypothyroidism, OA, osteoporosis, falls, R elbow fx, CKD, B TKA admitted with Achilles tendon avulsion, calcaneous fx, 5th metatarsal fx. s/p surgical repair 11/17/17. Pt with multiple remote fractures on xray including toes, compression fx and pubic body.     PT Comments    Pt admitted with above diagnosis. Pt currently with functional limitations due to balance and endurance deficits. Pt was able to take a few hops but fatigues quickly and needs +2 min to mod assist for safety.  Had to bring chair to pt.  Continue PT.  Pt will benefit from skilled PT to increase their independence and safety with mobility to allow discharge to the venue listed below.     Follow Up Recommendations  SNF;Supervision/Assistance - 24 hour     Equipment Recommendations  Hospital bed;Wheelchair (measurements PT);Wheelchair cushion (measurements PT)    Recommendations for Other Services       Precautions / Restrictions Precautions Precautions: Fall Restrictions Weight Bearing Restrictions: Yes LLE Weight Bearing: Non weight bearing    Mobility  Bed Mobility Overal bed mobility: Needs Assistance Bed Mobility: Supine to Sit     Supine to sit: Supervision     General bed mobility comments: HOB up, use of rail, increased time  Transfers Overall transfer level: Needs assistance Equipment used: Rolling walker (2 wheeled) Transfers: Sit to/from [facility] Sit to Stand: Min assist;+2 safety/equipment Stand pivot transfers: +2 safety/equipment;Min assist      Lateral/Scoot Transfers: Min assist General transfer comment: assist to rise and steady, cues for hand placement, pt transferred to chair and then stood for a second trial and hopped forward with chair close  behind  Ambulation/Gait Ambulation/Gait assistance: Min assist;+2 physical assistance Ambulation Distance (Feet): 3 Feet Assistive device: Rolling walker (2 wheeled) Gait Pattern/deviations: Step-to pattern;Decreased stride length;Trunk flexed;Drifts right/left   Gait velocity interpretation: <1.31 ft/sec, indicative of household ambulator General Gait Details: Pt fatigues quickly and could not stand for a long period of time with RW.  Pt was able to hop today a few steps however.  Fatigues quickly and loses stability in UEs needing more assist. Had to bring chair to pt to sit after a few steps.    Stairs             Wheelchair Mobility    Modified Rankin (Stroke Patients Only)       Balance Overall balance assessment: Needs assistance Sitting-balance support: No upper extremity supported;Feet supported Sitting balance-Leahy Scale: Fair     Standing balance support: Bilateral upper extremity supported;During functional activity Standing balance-Leahy Scale: Poor Standing balance comment: able to maintain NWB on L LE to hop with RW and min assist, but only a few feet                            Cognition Arousal/Alertness: Awake/alert Behavior During Therapy: WFL for tasks assessed/performed Overall Cognitive Status: Within Functional Limits for tasks assessed                                        Exercises General Exercises - Lower Extremity Heel Slides: AROM;Both;5 reps;Supine Hip Flexion/Marching: AROM;Both;5 reps;Standing    General Comments  Pertinent Vitals/Pain Pain Assessment: Faces Faces Pain Scale: Hurts a little bit Pain Location: R low back Pain Descriptors / Indicators: Sore Pain Intervention(s): Limited activity within patient's tolerance;Monitored during session;Repositioned    Home Living                      Prior Function            PT Goals (current goals can now be found in the care plan  section) Acute Rehab PT Goals Patient Stated Goal: to get back to walking/playing golf Progress towards PT goals: Progressing toward goals    Frequency    Min 3X/week      PT Plan Current plan remains appropriate    Co-evaluation PT/OT/SLP Co-Evaluation/Treatment: Yes Reason for Co-Treatment: For patient/therapist safety PT goals addressed during session: Mobility/safety with mobility OT goals addressed during session: ADL's and self-care      AM-PAC PT "6 Clicks" Daily Activity  Outcome Measure  Difficulty turning over in bed (including adjusting bedclothes, sheets and blankets)?: None Difficulty moving from lying on back to sitting on the side of the bed? : A Little Difficulty sitting down on and standing up from a chair with arms (e.g., wheelchair, bedside commode, etc,.)?: A Lot Help needed moving to and from a bed to chair (including a wheelchair)?: A Lot Help needed walking in hospital room?: A Lot Help needed climbing 3-5 steps with a railing? : Total 6 Click Score: 14    End of Session Equipment Utilized During Treatment: Gait belt Activity Tolerance: Patient limited by fatigue;Patient limited by pain Patient left: in chair;with call bell/phone within reach;with chair alarm set Nurse Communication: Mobility status PT Visit Diagnosis: Unsteadiness on feet (R26.81);Muscle weakness (generalized) (M62.81);Pain Pain - Right/Left: Left Pain - part of body: Leg     Time: 8242-3536 PT Time Calculation (min) (ACUTE ONLY): 25 min  Charges:  $Gait Training: 8-22 mins                    G Codes:       Jennifer Goodman,PT Acute Rehabilitation [phone] (pager)    Denice Paradise 11/21/2017, 3:38 PM

## 2017-11-21 NOTE — Discharge Summary (Signed)
Physician Discharge Summary  Jennifer Goodman VQQ:[id number] DOB: Sep 28, 1934 DOA: 11/15/2017  PCP: [place], Betty G, MD  Admit date: 11/15/2017 Discharge date: 11/21/2017  Admitted From: Home  Disposition:  SNF  Recommendations for Outpatient Follow-up:  1. Follow up with PCP in 1-2 weeks 2. Please obtain BMP/CBC in one week 3. Needs to follow up with Dr Rhona Raider in 7 to 14 days.  4. Non Weightbearing left leg  5. Follow up with cardiology, needs Holter monitor.     Discharge Condition: stable.  CODE STATUS: Full code.  Diet recommendation: Heart Healthy   Brief/Interim Summary: 83 y.o.femalewith medical history significant ofhypertension, hypothyroidism, osteoarthritis, osteoporosis,previousfalls with multiple orthopedic surgeries who presented to the year today after sustaining mechanical fall at home.    Assessment & Plan:   Principal Problem:   Achilles tendon avulsion Active Problems:   Hypertension, essential, benign   Hypothyroidism (acquired)   CKD (chronic kidney disease), stage III (HCC)   Fall   Displaced fracture of fifth metatarsal bone, left foot, initial encounter for closed fracture   Fracture, foot, left, closed, initial encounter   Paroxysmal atrial fibrillation (HCC)  Achilles tendon avulsion, calcaneus fracture.  -CT ankle; Displaced rotated avulsion fracture of the posterosuperior aspect of the calcaneus at the Achilles insertion. -Slightly distracted fracture of the base of the fifth metatarsal. -open treatment of calcaneous and TAR.  Stable.  -Needs to follow up with Dr Rhona Raider in 7 to 14 days.  -Non Weightbearing left leg.  A fib RVR; post op Now sinus  Cardiology consulted.  Anticoagulation per cardiology, ortho.  She was initially on Cardizem gtt.  Now on eliquis  Needs to follow with cardiology for holter   HTN; hold irbesartan per op and due to hyperkalemia.  Monitor BP.    Hypothyroidism; continue with synthroid.   CKD  stage III. Follow trend. Stable.   Hyperkalemia. Hold ibersartan.  Resolved. Repeat labs this am.   Right hip pain; x ray negative.   Voltaren PRN   Discharge Diagnoses:  Principal Problem:   Achilles tendon avulsion Active Problems:   Hypertension, essential, benign   Hypothyroidism (acquired)   CKD (chronic kidney disease), stage III ([place])   Fall   Displaced fracture of fifth metatarsal bone, left foot, initial encounter for closed fracture   Fracture, foot, left, closed, initial encounter   Paroxysmal atrial fibrillation [facility])    Discharge Instructions  Discharge Instructions    Diet - low sodium heart healthy   Complete by:  As directed    Increase activity slowly   Complete by:  As directed    Non weight bearing   Complete by:  As directed    Laterality:  left   Extremity:  Lower     Allergies as of 11/21/2017      Reactions   Oxycodone Other (See Comments)   Makes hallucinates   Sulfamethoxazole Nausea Only      Medication List    STOP taking these medications   doxycycline 100 MG capsule Commonly known as:  VIBRAMYCIN   irbesartan 150 MG tablet Commonly known as:  AVAPRO     TAKE these medications   apixaban 5 MG Tabs tablet Commonly known as:  ELIQUIS Take 1 tablet (5 mg total) by mouth 2 (two) times daily.   CALCIUM 600 600 MG Tabs tablet Generic drug:  calcium carbonate Take 1,200 mg by mouth daily.   cyanocobalamin 1000 MCG/ML injection Commonly known as:  (VITAMIN B-12) Inject 1,000 mcg into the  muscle every 30 (thirty) days.   docusate sodium 100 MG capsule Commonly known as:  COLACE Take 1 capsule (100 mg total) by mouth 2 (two) times daily.   ENBREL 50 MG/ML injection Generic drug:  etanercept Inject 50 mg into the skin once a week. Thursdays   folic acid 1 MG tablet Commonly known as:  FOLVITE Take 1 mg by mouth daily.   HYDROcodone-acetaminophen 5-325 MG tablet Commonly known as:  NORCO/VICODIN Take 1 tablet by mouth  every 6 (six) hours as needed for moderate pain. What changed:  when to take this   levothyroxine 50 MCG tablet Commonly known as:  SYNTHROID, LEVOTHROID Take 50 mcg by mouth daily before breakfast.   methotrexate 2.5 MG tablet Commonly known as:  RHEUMATREX Take 20 mg by mouth every Monday.   polyethylene glycol packet Commonly known as:  MIRALAX / GLYCOLAX Take 17 g by mouth daily as needed for mild constipation.   Vitamin D3 5000 units Tabs Take 10,000 tablets by mouth daily.            Discharge Care Instructions  (From admission, onward)        Start     Ordered   11/19/17 0000  Non weight bearing    Question Answer Comment  Laterality left   Extremity Lower      11/19/17 0751     Follow-up Information    Melrose Nakayama, MD. Schedule an appointment as soon as possible for a visit in 1 week.   Specialty:  Orthopedic Surgery Contact information: [place] [address] [phone]          Allergies  Allergen Reactions  . Oxycodone Other (See Comments)    Makes hallucinates  . Sulfamethoxazole Nausea Only    Consultations:  Ortho  Cardiology    Procedures/Studies: Dg Ribs Unilateral W/chest Right  Result Date: 11/15/2017 CLINICAL DATA:  Fall today coming in from garage.  Right rib pain. EXAM: RIGHT RIBS AND CHEST - 3+ VIEW COMPARISON:  Chest radiograph 10/10/2017 FINDINGS: No fracture or other bone lesions are seen involving the right ribs. Remote left rib fracture, as seen on prior exam. There is no evidence of pneumothorax or pleural effusion. Calcified granuloma in the right lung. Heart size and mediastinal contours are within normal limits. IMPRESSION: Negative radiographs of the chest and right ribs. Electronically Signed   By: Jeb Levering M.D.   On: 11/15/2017 21:55   Dg Lumbar Spine Complete  Result Date: 11/15/2017 CLINICAL DATA:  Fall coming in from garage, lumbosacral back pain. EXAM: LUMBAR SPINE - COMPLETE 4+ VIEW  COMPARISON:  Lumbar spine MRI 09/06/2017 FINDINGS: Moderate levo scoliotic curvature of the lumbar spine. Anterolisthesis of L4 on L5 retrolisthesis of L2 on L3 appears degenerative. Remote L1 compression fracture. Remaining lumbar vertebral body heights are preserved. Diffuse disc space narrowing and endplate spurring and facet arthropathy. The bones are under mineralized. Aorto bi-iliac atherosclerosis is incidentally noted. IMPRESSION: 1. No fracture or acute osseous abnormality of the lumbar spine. 2. Scoliosis and multilevel degenerative change. Remote L1 compression fracture. Electronically Signed   By: Jeb Levering M.D.   On: 11/15/2017 21:57   Dg Pelvis 1-2 Views  Result Date: 11/15/2017 CLINICAL DATA:  Fall coming in from garage tonight. EXAM: PELVIS - 1-2 VIEW COMPARISON:  Abdominopelvic CT 05/05/2015 FINDINGS: Remote right pubic body fracture, pubic symphysis is congruent. No acute fracture. Both femoral heads are well seated in the respective acetabula. Sacroiliac joints are congruent. IMPRESSION: 1.  No acute pelvic fracture. 2. Remote right pubic body fracture. Electronically Signed   By: Jeb Levering M.D.   On: 11/15/2017 21:58   Dg Ankle Complete Left  Result Date: 11/15/2017 CLINICAL DATA:  Fall, in from garage.  Left foot and ankle pain. EXAM: LEFT ANKLE COMPLETE - 3+ VIEW COMPARISON:  None. FINDINGS: Displaced avulsion fracture at the posterior calcaneus at the Achilles tendon insertion. Displaced fracture fragment measures 2.4 cm, there is approximately 2.7 cm distraction posteriorly. Apex dorsal angulation. Base of the fifth metatarsal fracture better assessed on concurrent foot radiographs. No additional fracture of the ankle. Prominent plantar calcaneal spur. The ankle mortise is preserved. Diffuse bony under mineralization. IMPRESSION: 1. Displaced posterior calcaneal fracture at the Achilles insertion with 2.7 cm osseous distraction. 2. Base of the fifth metatarsal fracture,  better assessed on concurrent foot radiographs. Electronically Signed   By: Jeb Levering M.D.   On: 11/15/2017 22:05   Ct Ankle Left Wo Contrast  Result Date: 11/16/2017 CLINICAL DATA:  Calcaneus fracture. EXAM: CT OF THE LEFT ANKLE WITHOUT CONTRAST TECHNIQUE: Multidetector CT imaging of the left ankle was performed according to the standard protocol. Multiplanar CT image reconstructions were also generated. COMPARISON:  Radiographs dated 11/15/2017 FINDINGS: Bones/Joint/Cartilage There is a displaced rotated fracture of the posterosuperior aspect of the calcaneus at the Achilles insertion. There is a fracture through the base of the fifth metatarsal with slight distraction. Marked diffuse osteopenia. Muscles and Tendons No discrete abnormality except for proximal retraction of the Achilles tendon due to the avulsion fracture of the posterior calcaneus. No discrete abnormality of the other tendons around the ankle. Soft tissues Multiple small calcifications in the subcutaneous soft tissues of lower leg. Slight soft tissue edema around the ankle and foot. Chronic calcification in the plantar fascia. IMPRESSION: Displaced rotated avulsion fracture of the posterosuperior aspect of the calcaneus at the Achilles insertion. Slightly distracted fracture of the base of the fifth metatarsal. Marked diffuse osteopenia. Electronically Signed   By: Lorriane Shire M.D.   On: 11/16/2017 14:07   Dg Foot Complete Left  Result Date: 11/15/2017 CLINICAL DATA:  Status post fall, with bilateral foot pain. Initial encounter. EXAM: LEFT FOOT - COMPLETE 3+ VIEW COMPARISON:  None. FINDINGS: There is a significantly displaced avulsion fracture at the posterosuperior calcaneus, reflecting avulsion of the Achilles tendon. There appear to be fractures through the base of the fifth metatarsal and base of the fifth proximal phalanx, with underlying lucency, suggesting these may be subacute in nature. No additional fractures are seen.  There is mild osteopenia of visualized osseous structures. An os peroneum is noted. A plantar calcaneal spur is noted. IMPRESSION: 1. Significantly displaced avulsion fracture at the posterosuperior calcaneus, reflecting avulsion of the Achilles tendon. 2. Fractures through the base of the fifth metatarsal and base of the fifth proximal phalanx, with underlying lucency, suggesting these may be subacute in nature. 3. Mild osteopenia of visualized osseous structures. 4. Os peroneum noted. Electronically Signed   By: Garald Balding M.D.   On: 11/15/2017 23:01   Dg Foot Complete Right  Result Date: 11/15/2017 CLINICAL DATA:  Fall coming in from garage tonight. Right foot pain. EXAM: RIGHT FOOT COMPLETE - 3+ VIEW COMPARISON:  None. FINDINGS: Fracture at the base of the fifth metatarsal has sclerotic margins and is likely chronic. Remote fractures of the first, second, and third metatarsal shafts. No acute fracture. Bones significantly under mineralized. Small plantar calcaneal spur and Achilles tendon enthesophyte. IMPRESSION: 1. Fracture at the  base of the fifth metatarsal has sclerotic margins and is likely chronic, recommend correlation with point tenderness. 2. Remote first, second, and third metatarsal shaft fractures. Electronically Signed   By: Jeb Levering M.D.   On: 11/15/2017 22:59      Subjective: Feeling well, wants to go to Rehab  Discharge Exam: Vitals:   11/20/17 2128 11/21/17 0532  BP: (!) 146/68 (!) 117/57  Pulse: 65 64  Resp:  18  Temp: 97.8 F (36.6 C) 97.8 F (36.6 C)  SpO2: 97% 95%   Vitals:   11/20/17 1612 11/20/17 2128 11/21/17 0532 11/21/17 0534  BP: (!) 141/68 (!) 146/68 (!) 117/57   Pulse: 64 65 64   Resp: 18  18   Temp: 97.6 F (36.4 C) 97.8 F (36.6 C) 97.8 F (36.6 C)   TempSrc: Oral Oral Oral   SpO2: 99% 97% 95%   Weight:    75.5 kg (166 lb 7.2 oz)  Height:        General: Pt is alert, awake, not in acute distress Cardiovascular: RRR, S1/S2 +, no  rubs, no gallops Respiratory: CTA bilaterally, no wheezing, no rhonchi Abdominal: Soft, NT, ND, bowel sounds + Extremities: no edema, no cyanosis. Left LE with dressing.     The results of significant diagnostics from this hospitalization (including imaging, microbiology, ancillary and laboratory) are listed below for reference.     Microbiology: Recent Results (from the past 240 hour(s))  Surgical pcr screen     Status: None   Collection Time: 11/16/17 10:57 PM  Result Value Ref Range Status   MRSA, PCR NEGATIVE NEGATIVE Final   Staphylococcus aureus NEGATIVE NEGATIVE Final    Comment: (NOTE) The Xpert SA Assay (FDA approved for NASAL specimens in patients 74 years of age and older), is one component of a comprehensive surveillance program. It is not intended to diagnose infection nor to guide or monitor treatment. Performed at [facility], [place] [address], [place], [place] [address]      Labs: BNP (last 3 results) No results for input(s): BNP in the last 8760 hours. Basic Metabolic Panel: Recent Labs  Lab 11/16/17 0540 11/16/17 1617 11/17/17 0646 11/17/17 1205 11/18/17 0917 11/19/17 0809  NA 135  --  139 137 133* 131*  K 4.4  --  5.2* 4.9 4.2 4.6  CL 102  --  104 104 101 100*  CO2 25  --  29 28 23  21*  GLUCOSE 108*  --  96 89 139* 75  BUN 21*  --  17 14 19  22*  CREATININE 0.97  --  1.11* 1.05* 1.09* 1.09*  CALCIUM 8.7*  --  9.5 9.2 8.5* 8.4*  MG  --  1.8  --   --   --   --    Liver Function Tests: Recent Labs  Lab 11/16/17 0540  AST 29  ALT 18  ALKPHOS 62  BILITOT 1.6*  PROT 6.0*  ALBUMIN 3.7   No results for input(s): LIPASE, AMYLASE in the last 168 hours. No results for input(s): AMMONIA in the last 168 hours. CBC: Recent Labs  Lab 11/15/17 2114 11/16/17 0540 11/17/17 0646 11/18/17 0917 11/19/17 1009  WBC 8.2 8.1 6.1 7.9 8.1  NEUTROABS 6.3  --   --   --   --   HGB 11.9* 11.3* 11.2* 11.3* 10.9*  HCT 35.9* 34.2* 34.6* 34.3* 35.1*  MCV  101.1* 99.7 102.7* 102.1* 108.3*  PLT 248 242 220 218 212   Cardiac Enzymes:  No results for input(s): CKTOTAL, CKMB, CKMBINDEX, TROPONINI in the last 168 hours. BNP: Invalid input(s): POCBNP CBG: No results for input(s): GLUCAP in the last 168 hours. D-Dimer No results for input(s): DDIMER in the last 72 hours. Hgb A1c No results for input(s): HGBA1C in the last 72 hours. Lipid Profile No results for input(s): CHOL, HDL, LDLCALC, TRIG, CHOLHDL, LDLDIRECT in the last 72 hours. Thyroid function studies No results for input(s): TSH, T4TOTAL, T3FREE, THYROIDAB in the last 72 hours.  Invalid input(s): FREET3 Anemia work up No results for input(s): VITAMINB12, FOLATE, FERRITIN, TIBC, IRON, RETICCTPCT in the last 72 hours. Urinalysis    Component Value Date/Time   COLORURINE YELLOW 05/29/2016 0700   APPEARANCEUR CLOUDY (A) 05/29/2016 0700   LABSPEC 1.023 05/29/2016 0700   PHURINE 5.0 05/29/2016 0700   GLUCOSEU 50 (A) 05/29/2016 0700   HGBUR SMALL (A) 05/29/2016 0700   BILIRUBINUR NEGATIVE 05/29/2016 0700   KETONESUR 5 (A) 05/29/2016 0700   PROTEINUR 30 (A) 05/29/2016 0700   NITRITE NEGATIVE 05/29/2016 0700   LEUKOCYTESUR LARGE (A) 05/29/2016 0700   Sepsis Labs Invalid input(s): PROCALCITONIN,  WBC,  LACTICIDVEN Microbiology Recent Results (from the past 240 hour(s))  Surgical pcr screen     Status: None   Collection Time: 11/16/17 10:57 PM  Result Value Ref Range Status   MRSA, PCR NEGATIVE NEGATIVE Final   Staphylococcus aureus NEGATIVE NEGATIVE Final    Comment: (NOTE) The Xpert SA Assay (FDA approved for NASAL specimens in patients 87 years of age and older), is one component of a comprehensive surveillance program. It is not intended to diagnose infection nor to guide or monitor treatment. Performed at [facility], [place] [address], [place], [place] [address]      Time coordinating discharge: 35 minutes  SIGNED:   Elmarie Shiley, MD  [facility] 11/21/2017, 8:38 AM Pager [phone]  If 7PM-7AM, please contact night-coverage [email or web address] Password [email or web address]

## 2017-11-21 NOTE — Progress Notes (Signed)
Subjective: 4 Days Post-Op Procedure(s) (LRB): OPEN TREATMENT OF CALCANEOUS FRACTURE (Left) ACHILLES TENDON REPAIR (Left)   Patient resting comfortably in bed this morning. She can wiggle her toes and is excited to be getting out of the hospital toady. Her goal is to eventually get back home and take care of herself.  Activity level:  Non weightbearing left leg Diet tolerance:  ok Voiding:  ok Patient reports pain as mild.    Objective: Vital signs in last 24 hours: Temp:  [97.6 F (36.4 C)-97.8 F (36.6 C)] 97.8 F (36.6 C) (06/10 0532) Pulse Rate:  [64-65] 64 (06/10 0532) Resp:  [18] 18 (06/10 0532) BP: (117-146)/(57-68) 117/57 (06/10 0532) SpO2:  [95 %-99 %] 95 % (06/10 0532) Weight:  [75.5 kg (166 lb 7.2 oz)] 75.5 kg (166 lb 7.2 oz) (06/10 0534)  Labs: Recent Labs    11/19/17 1009  HGB 10.9*   Recent Labs    11/19/17 1009  WBC 8.1  RBC 3.24*  HCT 35.1*  PLT 212   Recent Labs    11/19/17 0809  NA 131*  K 4.6  CL 100*  CO2 21*  BUN 22*  CREATININE 1.09*  GLUCOSE 75  CALCIUM 8.4*   No results for input(s): LABPT, INR in the last 72 hours.  Physical Exam:  Neurologically intact ABD soft Neurovascular intact Sensation intact distally No cellulitis present Compartment soft  Assessment/Plan:  4 Days Post-Op Procedure(s) (LRB): OPEN TREATMENT OF CALCANEOUS FRACTURE (Left) ACHILLES TENDON REPAIR (Left) Advance diet Up with therapy Discharge to SNF today. Keep splint clean and dry until follow up. Follow up in office either this Friday or next Monday. We greatly appreciate medical management. Continue nonweightbearing left leg.  Jennifer Goodman, Jennifer Goodman 11/21/2017, 9:29 AM

## 2017-11-21 NOTE — Clinical Social Work Placement (Signed)
   CLINICAL SOCIAL WORK PLACEMENT  NOTE  Date:  11/21/2017  Patient Details  Name: Jennifer Goodman MRN: [id number] Date of Birth: 1934/12/30  Clinical Social Work is seeking post-discharge placement for this patient at the [place] level of care (*CSW will initial, date and re-position this form in  chart as items are completed):  Yes   Patient/family provided with [facility] Work Department's list of facilities offering this level of care within the geographic area requested by the patient (or if unable, by the patient's family).  Yes   Patient/family informed of their freedom to choose among providers that offer the needed level of care, that participate in Medicare, Medicaid or managed care program needed by the patient, have an available bed and are willing to accept the patient.  Yes   Patient/family informed of [facility]'s ownership interest in [facility] and [facility], as well as of the fact that they are under no obligation to receive care at these facilities.  PASRR submitted to EDS on 11/18/17     PASRR number received on 11/18/17     Existing PASRR number confirmed on       FL2 transmitted to all facilities in geographic area requested by pt/family on 11/18/17     FL2 transmitted to all facilities within larger geographic area on       Patient informed that his/her managed care company has contracts with or will negotiate with certain facilities, including the following:  [facility], [place] [facility]     Yes   Patient/family informed of bed offers received.  Patient chooses bed at [facility] [place]     Physician recommends and patient chooses bed at      Patient to be transferred to [facility], [place] on 11/21/17.  Patient to be transferred to facility by [facility]     Patient family notified on 11/21/17 of transfer.  Name of family member notified:  Dalanie Kisner, spouse     PHYSICIAN       Additional Comment:     _______________________________________________ Estanislado Emms, LCSW 11/21/2017, 10:05 AM

## 2017-11-21 NOTE — Care Management Important Message (Signed)
Important Message  Patient Details  Name: Jennifer Goodman MRN: [id number] Date of Birth: 12-Dec-1934   Medicare Important Message Given:  Yes    Perkins Molina P [place] 11/21/2017, 3:46 PM

## 2017-11-21 NOTE — Progress Notes (Signed)
Report called to receiving RN at [facility]; all questions answered.

## 2017-11-21 NOTE — Progress Notes (Addendum)
SNF has received [facility] authorization for patient to admit today. Josem Kaufmann #[id number].   Patient will discharge to [facility]. Anticipated discharge date: 11/21/17 Family notified: Jimmy Stipes, spouse Transportation by: [facility]  Nurse to call report to [phone]. Patient will go to room 103A at the facility.   CSW signing off.  Estanislado Emms, [facility]  Clinical Social Worker

## 2017-11-21 NOTE — Progress Notes (Signed)
Occupational Therapy Treatment Patient Details Name: Jennifer Goodman MRN: [id number] DOB: [birth year] Today's Date: 11/21/2017    History of present illness Pt is an 83 year old woman with PMH of HTN, hypothyroidism, OA, osteoporosis, falls, R elbow fx, CKD, B TKA admitted with Achilles tendon avulsion, calcaneous fx, 5th metatarsal fx. s/p surgical repair 11/17/17. Pt with multiple remote fractures on xray including toes, compression fx and pubic body.    OT comments  Pt with improved strength and mobility. Able to hop with RW, maintaining NWB status on L LE with min assist and second person for safety. Requiring set up for grooming, min assist for UB dressing and total assist for pericare in standing. Pt reports she is discharging to SNF for rehab this pm.   Follow Up Recommendations  SNF;Supervision/Assistance - 24 hour    Equipment Recommendations  3 in 1 bedside commode;Wheelchair (measurements OT);Wheelchair cushion (measurements OT)    Recommendations for Other Services      Precautions / Restrictions Precautions Precautions: Fall Restrictions Weight Bearing Restrictions: Yes LLE Weight Bearing: Non weight bearing       Mobility Bed Mobility Overal bed mobility: Needs Assistance Bed Mobility: Supine to Sit     Supine to sit: Supervision     General bed mobility comments: HOB up, use of rail, increased time  Transfers Overall transfer level: Needs assistance Equipment used: Rolling walker (2 wheeled) Transfers: Sit to/from [facility] Sit to Stand: Min assist;+2 safety/equipment Stand pivot transfers: +2 safety/equipment;Min assist       General transfer comment: assist to rise and steady, cues for hand placement, pt transferred to chair and then stood for a second trial and hopped forward with chair close behind    Balance Overall balance assessment: Needs assistance   Sitting balance-Leahy Scale: Fair       Standing balance-Leahy Scale:  Poor Standing balance comment: able to maintain NWB on L LE to hop with RW and min assist, but only a few feet                           ADL either performed or assessed with clinical judgement   ADL Overall ADL's : Needs assistance/impaired     Grooming: Brushing hair;Sitting;Set up           Upper Body Dressing : Minimal assistance;Sitting   Lower Body Dressing: Total assistance;Bed level   Toilet Transfer: Minimal assistance;+2 for safety/equipment;Stand-pivot;RW   Toileting- Clothing Manipulation and Hygiene: Total assistance;+2 for safety/equipment;Sit to/from stand Toileting - Clothing Manipulation Details (indicate cue type and reason): pt with urinary incontinence     Functional mobility during ADLs: Minimal assistance;+2 for safety/equipment;Rolling walker;Cueing for sequencing(pt able to hop with RW and min assist)       Vision       Perception     Praxis      Cognition Arousal/Alertness: Awake/alert Behavior During Therapy: WFL for tasks assessed/performed Overall Cognitive Status: Within Functional Limits for tasks assessed                                          Exercises     Shoulder Instructions       General Comments      Pertinent Vitals/ Pain       Pain Assessment: Faces Faces Pain Scale: Hurts a little bit Pain Location:  R low back Pain Descriptors / Indicators: Sore Pain Intervention(s): Monitored during session;Repositioned  Home Living                                          Prior Functioning/Environment              Frequency  Min 2X/week        Progress Toward Goals  OT Goals(current goals can now be found in the care plan section)  Progress towards OT goals: Progressing toward goals  Acute Rehab OT Goals Patient Stated Goal: to get back to walking/playing golf OT Goal Formulation: With patient Time For Goal Achievement: 12/02/17 Potential to Achieve Goals: Good   Plan Discharge plan remains appropriate    Co-evaluation    PT/OT/SLP Co-Evaluation/Treatment: Yes Reason for Co-Treatment: For patient/therapist safety   OT goals addressed during session: ADL's and self-care      AM-PAC PT "6 Clicks" Daily Activity     Outcome Measure   Help from another person eating meals?: None Help from another person taking care of personal grooming?: A Little Help from another person toileting, which includes using toliet, bedpan, or urinal?: Total Help from another person bathing (including washing, rinsing, drying)?: A Lot Help from another person to put on and taking off regular upper body clothing?: A Little Help from another person to put on and taking off regular lower body clothing?: Total 6 Click Score: 14    End of Session Equipment Utilized During Treatment: Gait belt;Rolling walker  OT Visit Diagnosis: Unsteadiness on feet (R26.81);Muscle weakness (generalized) (M62.81);Pain   Activity Tolerance Patient tolerated treatment well   Patient Left in chair;with call bell/phone within reach;with chair alarm set   Nurse Communication          Time: [phone] OT Time Calculation (min): 32 min  Charges: OT General Charges $OT Visit: 1 Visit OT Treatments $Self Care/Home Management : 8-22 mins  11/21/2017 Jennifer Goodman, OTR/L Pager: [phone]   Jennifer Goodman 11/21/2017, 12:21 PM

## 2017-11-26 DIAGNOSIS — M059 Rheumatoid arthritis with rheumatoid factor, unspecified: Secondary | ICD-10-CM | POA: Diagnosis not present

## 2017-11-26 DIAGNOSIS — S92002A Unspecified fracture of left calcaneus, initial encounter for closed fracture: Secondary | ICD-10-CM | POA: Diagnosis not present

## 2017-11-26 DIAGNOSIS — E038 Other specified hypothyroidism: Secondary | ICD-10-CM | POA: Diagnosis not present

## 2017-11-26 DIAGNOSIS — I480 Paroxysmal atrial fibrillation: Secondary | ICD-10-CM | POA: Diagnosis not present

## 2017-11-26 DIAGNOSIS — D8989 Other specified disorders involving the immune mechanism, not elsewhere classified: Secondary | ICD-10-CM | POA: Diagnosis not present

## 2017-11-28 NOTE — Anesthesia Postprocedure Evaluation (Signed)
Anesthesia Post Note  Patient: Jennifer Goodman  Procedure(s) Performed: OPEN TREATMENT OF CALCANEOUS FRACTURE (Left ) ACHILLES TENDON REPAIR (Left )     Patient location during evaluation: PACU Anesthesia Type: General Level of consciousness: awake and alert Pain management: pain level controlled Vital Signs Assessment: post-procedure vital signs reviewed and stable Respiratory status: spontaneous breathing, nonlabored ventilation, respiratory function stable and patient connected to nasal cannula oxygen Cardiovascular status: blood pressure returned to baseline and stable Postop Assessment: no apparent nausea or vomiting Anesthetic complications: no    Last Vitals:  Vitals:   11/20/17 2128 11/21/17 0532  BP: (!) 146/68 (!) 117/57  Pulse: 65 64  Resp:  18  Temp: 36.6 C 36.6 C  SpO2: 97% 95%    Last Pain:  Vitals:   11/21/17 1430  TempSrc:   PainSc: 2                  Jennifer Goodman

## 2017-11-30 DIAGNOSIS — S86012A Strain of left Achilles tendon, initial encounter: Secondary | ICD-10-CM | POA: Diagnosis not present

## 2017-12-01 ENCOUNTER — Ambulatory Visit (INDEPENDENT_AMBULATORY_CARE_PROVIDER_SITE_OTHER): Payer: Medicare Other

## 2017-12-01 DIAGNOSIS — I480 Paroxysmal atrial fibrillation: Secondary | ICD-10-CM | POA: Diagnosis not present

## 2017-12-07 DIAGNOSIS — S86012A Strain of left Achilles tendon, initial encounter: Secondary | ICD-10-CM | POA: Diagnosis not present

## 2017-12-09 DIAGNOSIS — N183 Chronic kidney disease, stage 3 (moderate): Secondary | ICD-10-CM | POA: Diagnosis not present

## 2017-12-09 DIAGNOSIS — M069 Rheumatoid arthritis, unspecified: Secondary | ICD-10-CM | POA: Diagnosis not present

## 2017-12-09 DIAGNOSIS — E538 Deficiency of other specified B group vitamins: Secondary | ICD-10-CM | POA: Diagnosis not present

## 2017-12-09 DIAGNOSIS — E559 Vitamin D deficiency, unspecified: Secondary | ICD-10-CM | POA: Diagnosis not present

## 2017-12-09 DIAGNOSIS — Z9181 History of falling: Secondary | ICD-10-CM | POA: Diagnosis not present

## 2017-12-09 DIAGNOSIS — Z7901 Long term (current) use of anticoagulants: Secondary | ICD-10-CM | POA: Diagnosis not present

## 2017-12-09 DIAGNOSIS — I480 Paroxysmal atrial fibrillation: Secondary | ICD-10-CM | POA: Diagnosis not present

## 2017-12-09 DIAGNOSIS — Z471 Aftercare following joint replacement surgery: Secondary | ICD-10-CM | POA: Diagnosis not present

## 2017-12-09 DIAGNOSIS — D899 Disorder involving the immune mechanism, unspecified: Secondary | ICD-10-CM | POA: Diagnosis not present

## 2017-12-09 DIAGNOSIS — Z79891 Long term (current) use of opiate analgesic: Secondary | ICD-10-CM | POA: Diagnosis not present

## 2017-12-09 DIAGNOSIS — E039 Hypothyroidism, unspecified: Secondary | ICD-10-CM | POA: Diagnosis not present

## 2017-12-09 DIAGNOSIS — M810 Age-related osteoporosis without current pathological fracture: Secondary | ICD-10-CM | POA: Diagnosis not present

## 2017-12-09 DIAGNOSIS — S92352D Displaced fracture of fifth metatarsal bone, left foot, subsequent encounter for fracture with routine healing: Secondary | ICD-10-CM | POA: Diagnosis not present

## 2017-12-09 DIAGNOSIS — S92032D Displaced avulsion fracture of tuberosity of left calcaneus, subsequent encounter for fracture with routine healing: Secondary | ICD-10-CM | POA: Diagnosis not present

## 2017-12-14 DIAGNOSIS — S92032D Displaced avulsion fracture of tuberosity of left calcaneus, subsequent encounter for fracture with routine healing: Secondary | ICD-10-CM | POA: Diagnosis not present

## 2017-12-14 DIAGNOSIS — Z7901 Long term (current) use of anticoagulants: Secondary | ICD-10-CM | POA: Diagnosis not present

## 2017-12-14 DIAGNOSIS — E559 Vitamin D deficiency, unspecified: Secondary | ICD-10-CM | POA: Diagnosis not present

## 2017-12-14 DIAGNOSIS — N183 Chronic kidney disease, stage 3 (moderate): Secondary | ICD-10-CM | POA: Diagnosis not present

## 2017-12-14 DIAGNOSIS — E039 Hypothyroidism, unspecified: Secondary | ICD-10-CM | POA: Diagnosis not present

## 2017-12-14 DIAGNOSIS — D899 Disorder involving the immune mechanism, unspecified: Secondary | ICD-10-CM | POA: Diagnosis not present

## 2017-12-14 DIAGNOSIS — I480 Paroxysmal atrial fibrillation: Secondary | ICD-10-CM | POA: Diagnosis not present

## 2017-12-14 DIAGNOSIS — M810 Age-related osteoporosis without current pathological fracture: Secondary | ICD-10-CM | POA: Diagnosis not present

## 2017-12-14 DIAGNOSIS — Z9181 History of falling: Secondary | ICD-10-CM | POA: Diagnosis not present

## 2017-12-14 DIAGNOSIS — Z79891 Long term (current) use of opiate analgesic: Secondary | ICD-10-CM | POA: Diagnosis not present

## 2017-12-14 DIAGNOSIS — M069 Rheumatoid arthritis, unspecified: Secondary | ICD-10-CM | POA: Diagnosis not present

## 2017-12-14 DIAGNOSIS — E538 Deficiency of other specified B group vitamins: Secondary | ICD-10-CM | POA: Diagnosis not present

## 2017-12-14 DIAGNOSIS — S92352D Displaced fracture of fifth metatarsal bone, left foot, subsequent encounter for fracture with routine healing: Secondary | ICD-10-CM | POA: Diagnosis not present

## 2017-12-16 ENCOUNTER — Inpatient Hospital Stay: Payer: Medicare Other | Admitting: Family Medicine

## 2017-12-16 DIAGNOSIS — I480 Paroxysmal atrial fibrillation: Secondary | ICD-10-CM | POA: Diagnosis not present

## 2017-12-16 DIAGNOSIS — N183 Chronic kidney disease, stage 3 (moderate): Secondary | ICD-10-CM | POA: Diagnosis not present

## 2017-12-16 DIAGNOSIS — M069 Rheumatoid arthritis, unspecified: Secondary | ICD-10-CM | POA: Diagnosis not present

## 2017-12-16 DIAGNOSIS — Z7901 Long term (current) use of anticoagulants: Secondary | ICD-10-CM | POA: Diagnosis not present

## 2017-12-16 DIAGNOSIS — E039 Hypothyroidism, unspecified: Secondary | ICD-10-CM | POA: Diagnosis not present

## 2017-12-16 DIAGNOSIS — S92352D Displaced fracture of fifth metatarsal bone, left foot, subsequent encounter for fracture with routine healing: Secondary | ICD-10-CM | POA: Diagnosis not present

## 2017-12-16 DIAGNOSIS — Z0289 Encounter for other administrative examinations: Secondary | ICD-10-CM

## 2017-12-16 DIAGNOSIS — S92032D Displaced avulsion fracture of tuberosity of left calcaneus, subsequent encounter for fracture with routine healing: Secondary | ICD-10-CM | POA: Diagnosis not present

## 2017-12-16 DIAGNOSIS — E559 Vitamin D deficiency, unspecified: Secondary | ICD-10-CM | POA: Diagnosis not present

## 2017-12-16 DIAGNOSIS — Z79891 Long term (current) use of opiate analgesic: Secondary | ICD-10-CM | POA: Diagnosis not present

## 2017-12-16 DIAGNOSIS — D899 Disorder involving the immune mechanism, unspecified: Secondary | ICD-10-CM | POA: Diagnosis not present

## 2017-12-16 DIAGNOSIS — Z9181 History of falling: Secondary | ICD-10-CM | POA: Diagnosis not present

## 2017-12-16 DIAGNOSIS — E538 Deficiency of other specified B group vitamins: Secondary | ICD-10-CM | POA: Diagnosis not present

## 2017-12-16 DIAGNOSIS — M810 Age-related osteoporosis without current pathological fracture: Secondary | ICD-10-CM | POA: Diagnosis not present

## 2017-12-21 DIAGNOSIS — Z9889 Other specified postprocedural states: Secondary | ICD-10-CM | POA: Diagnosis not present

## 2017-12-26 DIAGNOSIS — Z4789 Encounter for other orthopedic aftercare: Secondary | ICD-10-CM | POA: Diagnosis not present

## 2017-12-26 DIAGNOSIS — S86002D Unspecified injury of left Achilles tendon, subsequent encounter: Secondary | ICD-10-CM | POA: Diagnosis not present

## 2017-12-26 DIAGNOSIS — M25572 Pain in left ankle and joints of left foot: Secondary | ICD-10-CM | POA: Diagnosis not present

## 2017-12-27 DIAGNOSIS — M0589 Other rheumatoid arthritis with rheumatoid factor of multiple sites: Secondary | ICD-10-CM | POA: Diagnosis not present

## 2017-12-27 DIAGNOSIS — G894 Chronic pain syndrome: Secondary | ICD-10-CM | POA: Diagnosis not present

## 2017-12-27 DIAGNOSIS — M150 Primary generalized (osteo)arthritis: Secondary | ICD-10-CM | POA: Diagnosis not present

## 2017-12-27 DIAGNOSIS — Z79899 Other long term (current) drug therapy: Secondary | ICD-10-CM | POA: Diagnosis not present

## 2017-12-28 DIAGNOSIS — Z4789 Encounter for other orthopedic aftercare: Secondary | ICD-10-CM | POA: Diagnosis not present

## 2017-12-28 DIAGNOSIS — M25572 Pain in left ankle and joints of left foot: Secondary | ICD-10-CM | POA: Diagnosis not present

## 2017-12-28 DIAGNOSIS — S86002D Unspecified injury of left Achilles tendon, subsequent encounter: Secondary | ICD-10-CM | POA: Diagnosis not present

## 2018-01-03 ENCOUNTER — Other Ambulatory Visit: Payer: Self-pay | Admitting: Physician Assistant

## 2018-01-03 DIAGNOSIS — R5381 Other malaise: Secondary | ICD-10-CM

## 2018-01-03 DIAGNOSIS — M25572 Pain in left ankle and joints of left foot: Secondary | ICD-10-CM | POA: Diagnosis not present

## 2018-01-03 DIAGNOSIS — S86002D Unspecified injury of left Achilles tendon, subsequent encounter: Secondary | ICD-10-CM | POA: Diagnosis not present

## 2018-01-03 DIAGNOSIS — Z4789 Encounter for other orthopedic aftercare: Secondary | ICD-10-CM | POA: Diagnosis not present

## 2018-01-05 ENCOUNTER — Telehealth: Payer: Self-pay | Admitting: Cardiovascular Disease

## 2018-01-05 DIAGNOSIS — M25572 Pain in left ankle and joints of left foot: Secondary | ICD-10-CM | POA: Diagnosis not present

## 2018-01-05 DIAGNOSIS — S86002D Unspecified injury of left Achilles tendon, subsequent encounter: Secondary | ICD-10-CM | POA: Diagnosis not present

## 2018-01-05 DIAGNOSIS — Z4789 Encounter for other orthopedic aftercare: Secondary | ICD-10-CM | POA: Diagnosis not present

## 2018-01-05 NOTE — Telephone Encounter (Signed)
es recorded by Fidel Levy, RN on 01/05/2018 at 2:28 PM EDT [facility] on [phone] (Home) *Preferred* Per verbal from MD, OK to stop eliquis based on results ------  Notes recorded by Sanda Klein, MD on 01/05/2018 at 12:33 PM EDT One year please ------  Notes recorded by Sanda Klein, MD on 01/04/2018 at 9:25 AM EDT As far as we can tell, atrial fibrillation may have been a single event related to acute illness. We can discuss stopping the Eliquis.     -patient aware and verbalized understanding.

## 2018-01-05 NOTE — Telephone Encounter (Signed)
New Message:      Pt is returning a call for her monitor results

## 2018-01-10 DIAGNOSIS — S86002D Unspecified injury of left Achilles tendon, subsequent encounter: Secondary | ICD-10-CM | POA: Diagnosis not present

## 2018-01-10 DIAGNOSIS — Z4789 Encounter for other orthopedic aftercare: Secondary | ICD-10-CM | POA: Diagnosis not present

## 2018-01-10 DIAGNOSIS — M25572 Pain in left ankle and joints of left foot: Secondary | ICD-10-CM | POA: Diagnosis not present

## 2018-01-12 DIAGNOSIS — M25572 Pain in left ankle and joints of left foot: Secondary | ICD-10-CM | POA: Diagnosis not present

## 2018-01-12 DIAGNOSIS — Z4789 Encounter for other orthopedic aftercare: Secondary | ICD-10-CM | POA: Diagnosis not present

## 2018-01-12 DIAGNOSIS — S86002D Unspecified injury of left Achilles tendon, subsequent encounter: Secondary | ICD-10-CM | POA: Diagnosis not present

## 2018-01-15 ENCOUNTER — Other Ambulatory Visit: Payer: Self-pay | Admitting: Family Medicine

## 2018-01-17 DIAGNOSIS — Z4789 Encounter for other orthopedic aftercare: Secondary | ICD-10-CM | POA: Diagnosis not present

## 2018-01-17 DIAGNOSIS — S86002D Unspecified injury of left Achilles tendon, subsequent encounter: Secondary | ICD-10-CM | POA: Diagnosis not present

## 2018-01-17 DIAGNOSIS — M25572 Pain in left ankle and joints of left foot: Secondary | ICD-10-CM | POA: Diagnosis not present

## 2018-01-18 DIAGNOSIS — M25572 Pain in left ankle and joints of left foot: Secondary | ICD-10-CM | POA: Diagnosis not present

## 2018-01-19 DIAGNOSIS — Z4789 Encounter for other orthopedic aftercare: Secondary | ICD-10-CM | POA: Diagnosis not present

## 2018-01-19 DIAGNOSIS — S86002D Unspecified injury of left Achilles tendon, subsequent encounter: Secondary | ICD-10-CM | POA: Diagnosis not present

## 2018-01-19 DIAGNOSIS — M25572 Pain in left ankle and joints of left foot: Secondary | ICD-10-CM | POA: Diagnosis not present

## 2018-01-24 DIAGNOSIS — M25572 Pain in left ankle and joints of left foot: Secondary | ICD-10-CM | POA: Diagnosis not present

## 2018-01-24 DIAGNOSIS — S86002D Unspecified injury of left Achilles tendon, subsequent encounter: Secondary | ICD-10-CM | POA: Diagnosis not present

## 2018-01-24 DIAGNOSIS — Z4789 Encounter for other orthopedic aftercare: Secondary | ICD-10-CM | POA: Diagnosis not present

## 2018-01-26 DIAGNOSIS — S86002D Unspecified injury of left Achilles tendon, subsequent encounter: Secondary | ICD-10-CM | POA: Diagnosis not present

## 2018-01-26 DIAGNOSIS — M25572 Pain in left ankle and joints of left foot: Secondary | ICD-10-CM | POA: Diagnosis not present

## 2018-01-26 DIAGNOSIS — Z4789 Encounter for other orthopedic aftercare: Secondary | ICD-10-CM | POA: Diagnosis not present

## 2018-01-31 DIAGNOSIS — M25572 Pain in left ankle and joints of left foot: Secondary | ICD-10-CM | POA: Diagnosis not present

## 2018-01-31 DIAGNOSIS — S86002D Unspecified injury of left Achilles tendon, subsequent encounter: Secondary | ICD-10-CM | POA: Diagnosis not present

## 2018-01-31 DIAGNOSIS — Z4789 Encounter for other orthopedic aftercare: Secondary | ICD-10-CM | POA: Diagnosis not present

## 2018-02-02 ENCOUNTER — Other Ambulatory Visit: Payer: Self-pay | Admitting: Physician Assistant

## 2018-02-02 DIAGNOSIS — Z4789 Encounter for other orthopedic aftercare: Secondary | ICD-10-CM | POA: Diagnosis not present

## 2018-02-02 DIAGNOSIS — E2839 Other primary ovarian failure: Secondary | ICD-10-CM

## 2018-02-02 DIAGNOSIS — S86002D Unspecified injury of left Achilles tendon, subsequent encounter: Secondary | ICD-10-CM | POA: Diagnosis not present

## 2018-02-02 DIAGNOSIS — M25572 Pain in left ankle and joints of left foot: Secondary | ICD-10-CM | POA: Diagnosis not present

## 2018-02-07 DIAGNOSIS — Z4789 Encounter for other orthopedic aftercare: Secondary | ICD-10-CM | POA: Diagnosis not present

## 2018-02-07 DIAGNOSIS — S86002D Unspecified injury of left Achilles tendon, subsequent encounter: Secondary | ICD-10-CM | POA: Diagnosis not present

## 2018-02-07 DIAGNOSIS — M25572 Pain in left ankle and joints of left foot: Secondary | ICD-10-CM | POA: Diagnosis not present

## 2018-02-09 DIAGNOSIS — M25572 Pain in left ankle and joints of left foot: Secondary | ICD-10-CM | POA: Diagnosis not present

## 2018-02-09 DIAGNOSIS — Z4789 Encounter for other orthopedic aftercare: Secondary | ICD-10-CM | POA: Diagnosis not present

## 2018-02-09 DIAGNOSIS — S86002D Unspecified injury of left Achilles tendon, subsequent encounter: Secondary | ICD-10-CM | POA: Diagnosis not present

## 2018-02-14 DIAGNOSIS — Z4789 Encounter for other orthopedic aftercare: Secondary | ICD-10-CM | POA: Diagnosis not present

## 2018-02-14 DIAGNOSIS — S86002D Unspecified injury of left Achilles tendon, subsequent encounter: Secondary | ICD-10-CM | POA: Diagnosis not present

## 2018-02-14 DIAGNOSIS — M25572 Pain in left ankle and joints of left foot: Secondary | ICD-10-CM | POA: Diagnosis not present

## 2018-02-15 DIAGNOSIS — M25572 Pain in left ankle and joints of left foot: Secondary | ICD-10-CM | POA: Diagnosis not present

## 2018-02-16 DIAGNOSIS — S86002D Unspecified injury of left Achilles tendon, subsequent encounter: Secondary | ICD-10-CM | POA: Diagnosis not present

## 2018-02-16 DIAGNOSIS — M25572 Pain in left ankle and joints of left foot: Secondary | ICD-10-CM | POA: Diagnosis not present

## 2018-02-16 DIAGNOSIS — Z4789 Encounter for other orthopedic aftercare: Secondary | ICD-10-CM | POA: Diagnosis not present

## 2018-02-24 ENCOUNTER — Ambulatory Visit (INDEPENDENT_AMBULATORY_CARE_PROVIDER_SITE_OTHER): Payer: Medicare Other | Admitting: Family Medicine

## 2018-02-24 ENCOUNTER — Encounter: Payer: Self-pay | Admitting: Family Medicine

## 2018-02-24 VITALS — BP 120/78 | HR 76 | Temp 97.8°F | Resp 12 | Ht 64.0 in | Wt 147.5 lb

## 2018-02-24 DIAGNOSIS — N183 Chronic kidney disease, stage 3 unspecified: Secondary | ICD-10-CM

## 2018-02-24 DIAGNOSIS — R300 Dysuria: Secondary | ICD-10-CM

## 2018-02-24 DIAGNOSIS — E039 Hypothyroidism, unspecified: Secondary | ICD-10-CM

## 2018-02-24 DIAGNOSIS — E7800 Pure hypercholesterolemia, unspecified: Secondary | ICD-10-CM | POA: Insufficient documentation

## 2018-02-24 DIAGNOSIS — I10 Essential (primary) hypertension: Secondary | ICD-10-CM

## 2018-02-24 DIAGNOSIS — Z23 Encounter for immunization: Secondary | ICD-10-CM

## 2018-02-24 LAB — POC URINALSYSI DIPSTICK (AUTOMATED)
BILIRUBIN UA: NEGATIVE
GLUCOSE UA: NEGATIVE
KETONES UA: NEGATIVE
Nitrite, UA: NEGATIVE
Protein, UA: NEGATIVE
RBC UA: NEGATIVE
SPEC GRAV UA: 1.025 (ref 1.010–1.025)
Urobilinogen, UA: 0.2 E.U./dL
pH, UA: 6.5 (ref 5.0–8.0)

## 2018-02-24 LAB — VITAMIN D 25 HYDROXY (VIT D DEFICIENCY, FRACTURES): VITD: 89.52 ng/mL (ref 30.00–100.00)

## 2018-02-24 LAB — BASIC METABOLIC PANEL
BUN: 22 mg/dL (ref 6–23)
CALCIUM: 9.8 mg/dL (ref 8.4–10.5)
CHLORIDE: 103 meq/L (ref 96–112)
CO2: 24 meq/L (ref 19–32)
CREATININE: 1.08 mg/dL (ref 0.40–1.20)
GFR: 51.76 mL/min — ABNORMAL LOW (ref >60.00)
GLUCOSE: 96 mg/dL (ref 70–99)
Potassium: 4.9 mEq/L (ref 3.5–5.1)
Sodium: 139 mEq/L (ref 135–145)

## 2018-02-24 MED ORDER — IRBESARTAN 150 MG PO TABS: 150.0000 mg | ORAL_TABLET | Freq: Every day | ORAL | 2 refills | Status: DC

## 2018-02-24 NOTE — Assessment & Plan Note (Signed)
It has been a stable. Avoid oral NSAIDs. Continue low-salt diet. Continue Avapro 150 mg daily.  She has had intermittent episodes of hyperkalemia, we discussed some side effects of Avapro. Follow-up in 6 months.

## 2018-02-24 NOTE — Assessment & Plan Note (Signed)
Adequately controlled. No changes in current management. Continue low-salt diet. Eye exam is current. F/U in 6 months, before if needed.  

## 2018-02-24 NOTE — Progress Notes (Signed)
HPI:   Jennifer Goodman is a 83 y.o. female, who is here today for 6 months follow up.   She was last seen on 08/24/17  Since her last OV she has had Achilles tendon avulsion,left calcaneous avulsion fracture, and displays fracture of fifth metatarsal bone after she suffered a fall at home. She underwent open reduction of left calcaneus avulsion fracture and left Achilles tendon repair. She completed 8 weeks of physical therapy and overall she is doing much better. She is still wearing a ankle bracelet when she goes out and she is using a cane for assistance.    Hypertension: Currently on Avapro 150 mg daily.  Lab Results  Component Value Date   CREATININE 1.09 (H) 11/19/2017   BUN 22 (H) 11/19/2017   NA 131 (L) 11/19/2017   K 4.6 11/19/2017   CL 100 (L) 11/19/2017   CO2 21 (L) 11/19/2017   CKD III ,she has not noted gross hematuria,foam in urine,or decreased urine output.  Denies severe/frequent headache, visual changes, chest pain, dyspnea, palpitation, claudication, focal weakness, or edema.  She takes Vit D 1000 U daily.   She thinks she might have a UTI. For a couple days she is having dysuria and urinary frequency as well as urgency. She states that in the past she has developed UTIs after surgical treatments.  She was recently treated for UTI, right after surgery.   She denies associated fevers, chills, abdominal pain, unusual back pain, vaginal discharge/bleeding. She has not tried OTC medications. Symptoms seem to be stable.   Review of Systems  Constitutional: Negative for activity change, appetite change, fatigue and fever.  HENT: Negative for mouth sores, nosebleeds and trouble swallowing.   Eyes: Negative for redness and visual disturbance.  Respiratory: Negative for cough, shortness of breath and wheezing.   Cardiovascular: Negative for chest pain, palpitations and leg swelling.  Gastrointestinal: Negative for abdominal pain, nausea and vomiting.        Negative for changes in bowel habits.  Genitourinary: Positive for dysuria, frequency and urgency. Negative for decreased urine volume, difficulty urinating and hematuria.  Musculoskeletal: Positive for arthralgias and gait problem.  Neurological: Negative for syncope, weakness and headaches.      Current Outpatient Medications on File Prior to Visit  Medication Sig Dispense Refill  . calcium carbonate (CALCIUM 600) 600 MG TABS tablet Take 1,200 mg by mouth daily.    . Cholecalciferol (VITAMIN D3) 5000 UNITS TABS Take 10,000 tablets by mouth daily.     . cyanocobalamin (,VITAMIN B-12,) 1000 MCG/ML injection Inject 1,000 mcg into the muscle every 30 (thirty) days.    Marland Kitchen docusate sodium (COLACE) 100 MG capsule Take 1 capsule (100 mg total) by mouth 2 (two) times daily. 10 capsule 0  . etanercept (ENBREL) 50 MG/ML injection Inject 50 mg into the skin once a week. Thursdays    . folic acid (FOLVITE) 1 MG tablet Take 1 mg by mouth daily.    Marland Kitchen HYDROcodone-acetaminophen (NORCO/VICODIN) 5-325 MG tablet Take 1 tablet by mouth every 6 (six) hours as needed for moderate pain. 30 tablet 0  . levothyroxine (SYNTHROID, LEVOTHROID) 50 MCG tablet TAKE 1 TABLET IN THE MORNING ON AN EMPTY STOMACH. 90 tablet 2  . methotrexate (RHEUMATREX) 2.5 MG tablet Take 20 mg by mouth every Monday.      No current facility-administered medications on file prior to visit.      Past Medical History:  Diagnosis Date  . Anemia   .  Arthritis    Rhuematoid-takes Enbrel and Methorexate   . Back pain    states in Jan 2017 spinal was attempted 4 times and has had back pain since.  . Cancer ([place])    skin cancer on head  . Chicken pox   . Diverticulosis   . History of colon polyps    benign  . Hypertension    takes Avapro daily  . Hypothyroidism    takes Synthroid daily  . Joint pain   . Pneumonia    at age 71  . PONV (postoperative nausea and vomiting)    the patch helps!  . Urinary frequency   . Urinary  urgency    was taking Myrbetriq but stopped.Plans to start back   Allergies  Allergen Reactions  . Oxycodone Other (See Comments)    Makes hallucinates  . Sulfamethoxazole Nausea Only    Social History   Socioeconomic History  . Marital status: Married    Spouse name: Not on file  . Number of children: 2  . Years of education: Not on file  . Highest education level: Not on file  Occupational History  . Occupation: Retired  Scientific laboratory technician  . Financial resource strain: Not on file  . Food insecurity:    Worry: Not on file    Inability: Not on file  . Transportation needs:    Medical: Not on file    Non-medical: Not on file  Tobacco Use  . Smoking status: Former Smoker    Last attempt to quit: 06/14/1978    Years since quitting: 39.7  . Smokeless tobacco: Never Used  . Tobacco comment: quit 32 yrs ago  Substance and Sexual Activity  . Alcohol use: Yes    Comment: occ  . Drug use: No  . Sexual activity: Never  Lifestyle  . Physical activity:    Days per week: Not on file    Minutes per session: Not on file  . Stress: Not on file  Relationships  . Social connections:    Talks on phone: Not on file    Gets together: Not on file    Attends religious service: Not on file    Active member of club or organization: Not on file    Attends meetings of clubs or organizations: Not on file    Relationship status: Not on file  Other Topics Concern  . Not on file  Social History Narrative   She lives in [place] with her husband.    Vitals:   02/24/18 1028  BP: 120/78  Pulse: 76  Resp: 12  Temp: 97.8 F (36.6 C)  SpO2: 98%   Body mass index is 25.32 kg/m.   Physical Exam  Nursing note and vitals reviewed. Constitutional: She is oriented to person, place, and time. She appears well-developed and well-nourished. No distress.  HENT:  Head: Normocephalic and atraumatic.  Mouth/Throat: Oropharynx is clear and moist and mucous membranes are normal.  Eyes: Pupils are  equal, round, and reactive to light. Conjunctivae are normal.  Cardiovascular: Normal rate and regular rhythm.  No murmur heard. Pulses:      Posterior tibial pulses are 2+ on the right side.  Respiratory: Effort normal and breath sounds normal. No respiratory distress.  GI: Soft. She exhibits no mass. There is no hepatomegaly. There is no tenderness.  Musculoskeletal: She exhibits no edema.  Lymphadenopathy:    She has no cervical adenopathy.  Neurological: She is alert and oriented to person, place, and time. She  has normal strength. No cranial nerve deficit. Gait normal.  Skin: Skin is warm. No rash noted. No erythema.  Psychiatric: She has a normal mood and affect.  Well groomed, good eye contact.       ASSESSMENT AND PLAN:   Ms. Missey Hasley was seen today for 6 months follow-up.  Orders Placed This Encounter  Procedures  . Culture, Urine  . Flu vaccine HIGH DOSE PF  . VITAMIN D 25 Hydroxy (Vit-D Deficiency, Fractures)  . Basic metabolic panel  . POCT Urinalysis Dipstick (Automated)   Lab Results  Component Value Date   CREATININE 1.08 02/24/2018   BUN 22 02/24/2018   NA 139 02/24/2018   K 4.9 02/24/2018   CL 103 02/24/2018   CO2 24 02/24/2018    CKD (chronic kidney disease), stage III (HCC) It has been a stable. Avoid oral NSAIDs. Continue low-salt diet. Continue Avapro 150 mg daily.  She has had intermittent episodes of hyperkalemia, we discussed some side effects of Avapro. Follow-up in 6 months.  Hypertension, essential, benign Adequately controlled. No changes in current management. Continue low-salt diet Eye exam is current. F/U in 6 months, before if needed.   Dysuria  Possible etiologies discussed. Reporting completing abx treatment recently to treat UTI. Side effects of quinolones discussed. We will follow Ucx and treat accordingly.  - POCT Urinalysis Dipstick (Automated) - Culture, Urine  Encounter for immunization - Flu vaccine  HIGH DOSE PF   CPE 08/2017,fasting labs a week before visit.   Keondre Markson G. [place], MD  [facility]. [place] office.

## 2018-02-24 NOTE — Patient Instructions (Addendum)
A few things to remember from today's visit:   Hypertension, essential, benign  CKD (chronic kidney disease), stage III ([place]) - Plan: VITAMIN D 25 Hydroxy (Vit-D Deficiency, Fractures), Basic metabolic panel  Dysuria - Plan: POCT Urinalysis Dipstick (Automated), Culture, Urine  No changes today.  Urine test pending.  Fall precautions.   Please be sure medication list is accurate. If a new problem present, please set up appointment sooner than planned today.

## 2018-02-26 LAB — URINE CULTURE
MICRO NUMBER:: 91100351
Result:: NO GROWTH
SPECIMEN QUALITY: ADEQUATE

## 2018-03-15 DIAGNOSIS — M25572 Pain in left ankle and joints of left foot: Secondary | ICD-10-CM | POA: Diagnosis not present

## 2018-03-28 DIAGNOSIS — C44622 Squamous cell carcinoma of skin of right upper limb, including shoulder: Secondary | ICD-10-CM | POA: Diagnosis not present

## 2018-03-28 DIAGNOSIS — D692 Other nonthrombocytopenic purpura: Secondary | ICD-10-CM | POA: Diagnosis not present

## 2018-03-28 DIAGNOSIS — Z85828 Personal history of other malignant neoplasm of skin: Secondary | ICD-10-CM | POA: Diagnosis not present

## 2018-03-28 DIAGNOSIS — D225 Melanocytic nevi of trunk: Secondary | ICD-10-CM | POA: Diagnosis not present

## 2018-03-28 DIAGNOSIS — L570 Actinic keratosis: Secondary | ICD-10-CM | POA: Diagnosis not present

## 2018-03-29 ENCOUNTER — Ambulatory Visit
Admission: RE | Admit: 2018-03-29 | Discharge: 2018-03-29 | Disposition: A | Discharge status: Home / Self Care | Payer: Medicare Other | Source: Ambulatory Visit | Attending: Physician Assistant | Admitting: Physician Assistant

## 2018-03-29 DIAGNOSIS — M8589 Other specified disorders of bone density and structure, multiple sites: Secondary | ICD-10-CM | POA: Diagnosis not present

## 2018-03-29 DIAGNOSIS — Z780 Asymptomatic menopausal state: Secondary | ICD-10-CM | POA: Diagnosis not present

## 2018-03-29 DIAGNOSIS — S86012D Strain of left Achilles tendon, subsequent encounter: Secondary | ICD-10-CM | POA: Diagnosis not present

## 2018-03-29 DIAGNOSIS — M150 Primary generalized (osteo)arthritis: Secondary | ICD-10-CM | POA: Diagnosis not present

## 2018-03-29 DIAGNOSIS — E2839 Other primary ovarian failure: Secondary | ICD-10-CM

## 2018-03-29 DIAGNOSIS — G894 Chronic pain syndrome: Secondary | ICD-10-CM | POA: Diagnosis not present

## 2018-03-29 DIAGNOSIS — M0589 Other rheumatoid arthritis with rheumatoid factor of multiple sites: Secondary | ICD-10-CM | POA: Diagnosis not present

## 2018-05-04 DIAGNOSIS — L988 Other specified disorders of the skin and subcutaneous tissue: Secondary | ICD-10-CM | POA: Diagnosis not present

## 2018-05-04 DIAGNOSIS — C44622 Squamous cell carcinoma of skin of right upper limb, including shoulder: Secondary | ICD-10-CM | POA: Diagnosis not present

## 2018-05-15 DIAGNOSIS — Z4802 Encounter for removal of sutures: Secondary | ICD-10-CM | POA: Diagnosis not present

## 2018-05-15 DIAGNOSIS — M25572 Pain in left ankle and joints of left foot: Secondary | ICD-10-CM | POA: Diagnosis not present

## 2018-06-26 ENCOUNTER — Other Ambulatory Visit: Payer: Self-pay | Admitting: Family Medicine

## 2018-06-26 DIAGNOSIS — N183 Chronic kidney disease, stage 3 unspecified: Secondary | ICD-10-CM

## 2018-06-26 DIAGNOSIS — I10 Essential (primary) hypertension: Secondary | ICD-10-CM

## 2018-06-29 DIAGNOSIS — M150 Primary generalized (osteo)arthritis: Secondary | ICD-10-CM | POA: Diagnosis not present

## 2018-06-29 DIAGNOSIS — M0589 Other rheumatoid arthritis with rheumatoid factor of multiple sites: Secondary | ICD-10-CM | POA: Diagnosis not present

## 2018-06-29 DIAGNOSIS — Z79899 Other long term (current) drug therapy: Secondary | ICD-10-CM | POA: Diagnosis not present

## 2018-06-29 DIAGNOSIS — S86012D Strain of left Achilles tendon, subsequent encounter: Secondary | ICD-10-CM | POA: Diagnosis not present

## 2018-06-29 DIAGNOSIS — M542 Cervicalgia: Secondary | ICD-10-CM | POA: Diagnosis not present

## 2018-06-29 DIAGNOSIS — G894 Chronic pain syndrome: Secondary | ICD-10-CM | POA: Diagnosis not present

## 2018-08-18 ENCOUNTER — Other Ambulatory Visit: Payer: Medicare Other

## 2018-08-18 DIAGNOSIS — M542 Cervicalgia: Secondary | ICD-10-CM | POA: Diagnosis not present

## 2018-08-18 DIAGNOSIS — I10 Essential (primary) hypertension: Secondary | ICD-10-CM | POA: Diagnosis not present

## 2018-08-23 ENCOUNTER — Other Ambulatory Visit (INDEPENDENT_AMBULATORY_CARE_PROVIDER_SITE_OTHER): Payer: Medicare Other

## 2018-08-23 ENCOUNTER — Other Ambulatory Visit: Payer: Self-pay

## 2018-08-23 DIAGNOSIS — E039 Hypothyroidism, unspecified: Secondary | ICD-10-CM | POA: Diagnosis not present

## 2018-08-23 DIAGNOSIS — N183 Chronic kidney disease, stage 3 unspecified: Secondary | ICD-10-CM

## 2018-08-23 DIAGNOSIS — E7800 Pure hypercholesterolemia, unspecified: Secondary | ICD-10-CM

## 2018-08-23 DIAGNOSIS — I10 Essential (primary) hypertension: Secondary | ICD-10-CM | POA: Diagnosis not present

## 2018-08-23 LAB — BASIC METABOLIC PANEL
BUN: 15 mg/dL (ref 6–23)
CO2: 26 meq/L (ref 19–32)
Calcium: 8.9 mg/dL (ref 8.4–10.5)
Chloride: 104 mEq/L (ref 96–112)
Creatinine, Ser: 1 mg/dL (ref 0.40–1.20)
GFR: 53.16 mL/min — AB (ref >60.00)
GLUCOSE: 87 mg/dL (ref 70–99)
POTASSIUM: 4.7 meq/L (ref 3.5–5.1)
SODIUM: 138 meq/L (ref 135–145)

## 2018-08-23 LAB — TSH: TSH: 2.95 u[IU]/mL (ref 0.35–4.50)

## 2018-08-23 LAB — LIPID PANEL
CHOLESTEROL: 186 mg/dL (ref 0–200)
HDL: 100.7 mg/dL (ref >39.00)
LDL CALC: 72 mg/dL (ref 0–99)
NonHDL: 85.21
Total CHOL/HDL Ratio: 2
Triglycerides: 68 mg/dL (ref 0.0–149.0)
VLDL: 13.6 mg/dL (ref 0.0–40.0)

## 2018-08-23 LAB — MICROALBUMIN / CREATININE URINE RATIO
CREATININE, U: 172.2 mg/dL
MICROALB UR: 1 mg/dL (ref 0.0–1.9)
MICROALB/CREAT RATIO: 0.6 mg/g (ref 0.0–30.0)

## 2018-08-25 ENCOUNTER — Encounter: Payer: Medicare Other | Admitting: Family Medicine

## 2018-08-29 ENCOUNTER — Ambulatory Visit (INDEPENDENT_AMBULATORY_CARE_PROVIDER_SITE_OTHER): Payer: Medicare Other | Admitting: Family Medicine

## 2018-08-29 ENCOUNTER — Encounter: Payer: Self-pay | Admitting: Family Medicine

## 2018-08-29 ENCOUNTER — Other Ambulatory Visit: Payer: Self-pay

## 2018-08-29 VITALS — BP 118/80 | HR 80 | Temp 97.7°F | Resp 12 | Ht 64.0 in | Wt 154.4 lb

## 2018-08-29 DIAGNOSIS — N183 Chronic kidney disease, stage 3 unspecified: Secondary | ICD-10-CM

## 2018-08-29 DIAGNOSIS — E7800 Pure hypercholesterolemia, unspecified: Secondary | ICD-10-CM

## 2018-08-29 DIAGNOSIS — I10 Essential (primary) hypertension: Secondary | ICD-10-CM

## 2018-08-29 DIAGNOSIS — Z0000 Encounter for general adult medical examination without abnormal findings: Secondary | ICD-10-CM

## 2018-08-29 DIAGNOSIS — M25561 Pain in right knee: Secondary | ICD-10-CM | POA: Diagnosis not present

## 2018-08-29 DIAGNOSIS — E039 Hypothyroidism, unspecified: Secondary | ICD-10-CM | POA: Diagnosis not present

## 2018-08-29 MED ORDER — IRBESARTAN 150 MG PO TABS: 150.0000 mg | ORAL_TABLET | Freq: Every day | ORAL | 3 refills | Status: DC

## 2018-08-29 MED ORDER — LEVOTHYROXINE SODIUM 50 MCG PO TABS: 50.0000 ug | ORAL_TABLET | Freq: Every day | ORAL | 3 refills | Status: DC

## 2018-08-29 NOTE — Progress Notes (Signed)
HPI:   Jennifer Goodman is a 84 y.o. female, who is here today for her routine physical and Medicare preventive visit.  Last CPE: 04/14/2017  She had labs done on 08/23/18.  Regular exercise 3 or more time per week: Until 2 months ago she was exercising regularly, playing golf and walking.  She has not been able to do so since she injured left knee.  Following a healthy diet: Yes. She lives with her husband.  Chronic medical problems: Paroxymal trial fib,HTN,RA,CKD III,OA,hypothyrodism,osteoporosis,and HLD.  HLD, she is on non pharmacologic treatment. She has not tolerated statin meds.  Lab Results  Component Value Date   CHOL 186 08/23/2018   HDL 100.70 08/23/2018   LDLCALC 72 08/23/2018   TRIG 68.0 08/23/2018   CHOLHDL 2 08/23/2018   HTN and CKD 3. She has not noted decreased in urine output,foam in urine,or gross hematuria.on Avapro 150 mg daily.  Lab Results  Component Value Date   CREATININE 1.00 08/23/2018   BUN 15 08/23/2018   NA 138 08/23/2018   K 4.7 08/23/2018   CL 104 08/23/2018   CO2 26 08/23/2018    Hypothyroidism on Levothyroxine 50 mcg daily. Lab Results  Component Value Date   TSH 2.95 08/23/2018    RA, follows every 3 months with rheuma,she is on Enbrel and Methotrexate.   Immunization History  Administered Date(s) Administered  . Influenza, High Dose Seasonal PF 02/24/2018    Mammogram: 04/2016. Colonoscopy: 03/2009. DEXA: 03/29/18. Osteoporosis , 6 months ago she was started on Fosamax.  She has no concerns today.  Last AWV on 04/11/17.  Functional Status Survey: Is the patient deaf or have difficulty hearing?: No Does the patient have difficulty seeing, even when wearing glasses/contacts?: No Does the patient have difficulty concentrating, remembering, or making decisions?: No Does the patient have difficulty walking or climbing stairs?: Yes(Uses a cane,recent knee injury) Does the patient have difficulty dressing or  bathing?: No Does the patient have difficulty doing errands alone such as visiting a doctor's office or shopping?: No  Fall Risk  08/29/2018 02/24/2018  Falls in the past year? 1 Yes  Comment - tripped in her kitchen.  Number falls in past yr: 0 1  Injury with Fall? 1 Yes  Risk for fall due to : Impaired balance/gait;Orthopedic patient Impaired mobility  Follow up Education provided -    Providers she sees regularly: Eye care provider: Dr Delman Cheadle Rheumatologist, Dr Amil Amen, last Alexis 06/2018. Orthopedist for OA and cervicalgia Dr Berenice Primas and Dr Genelle Gather. She has appt today to address knee pain. Dermatologist,Dr [employer], last seen in 03/2018. Dentist,Dr Gloriann Loan.   Depression screen [facility] 2/9 08/29/2018  Decreased Interest 0  Down, Depressed, Hopeless 0  PHQ - 2 Score 0    Mini-Cog - 08/29/18 1305    Normal clock drawing test?  yes    How many words correct?  3        Visual Acuity Screening   Right eye Left eye Both eyes  Without correction: 20/25 20/25 20/25  With correction:       Review of Systems  Constitutional: Negative for activity change, appetite change, fatigue and fever.  HENT: Negative for mouth sores, nosebleeds and sore throat.   Eyes: Negative for redness and visual disturbance.  Respiratory: Negative for cough, shortness of breath and wheezing.   Cardiovascular: Negative for chest pain, palpitations and leg swelling.  Gastrointestinal: Negative for abdominal pain, nausea and vomiting.  Negative for changes in bowel habits.  Endocrine: Negative for polydipsia, polyphagia and polyuria.  Genitourinary: Negative for decreased urine volume, dysuria and hematuria.  Musculoskeletal: Positive for arthralgias, gait problem and neck pain.  Skin: Negative for rash and wound.  Neurological: Negative for syncope, weakness and headaches.  Psychiatric/Behavioral: Negative for confusion. The patient is not nervous/anxious.   All other systems reviewed and are negative.    Current Outpatient Medications on File Prior to Visit  Medication Sig Dispense Refill  . alendronate (FOSAMAX) 70 MG tablet     . calcium carbonate (CALCIUM 600) 600 MG TABS tablet Take 1,200 mg by mouth daily.    . Cholecalciferol (VITAMIN D3) 5000 UNITS TABS Take 10,000 tablets by mouth daily.     . cyanocobalamin (,VITAMIN B-12,) 1000 MCG/ML injection Inject 1,000 mcg into the muscle every 30 (thirty) days.    Marland Kitchen docusate sodium (COLACE) 100 MG capsule Take 1 capsule (100 mg total) by mouth 2 (two) times daily. 10 capsule 0  . etanercept (ENBREL) 50 MG/ML injection Inject 50 mg into the skin once a week. Thursdays    . folic acid (FOLVITE) 1 MG tablet Take 1 mg by mouth daily.    Marland Kitchen HYDROcodone-acetaminophen (NORCO/VICODIN) 5-325 MG tablet Take 1 tablet by mouth every 6 (six) hours as needed for moderate pain. 30 tablet 0  . methotrexate (RHEUMATREX) 2.5 MG tablet Take 20 mg by mouth every Monday.      No current facility-administered medications on file prior to visit.      Past Medical History:  Diagnosis Date  . Anemia   . Arthritis    Rhuematoid-takes Enbrel and Methorexate   . Back pain    states in Jan 2017 spinal was attempted 4 times and has had back pain since.  . Cancer ([place])    skin cancer on head  . Chicken pox   . Diverticulosis   . History of colon polyps    benign  . Hypertension    takes Avapro daily  . Hypothyroidism    takes Synthroid daily  . Joint pain   . Pneumonia    at age 62  . PONV (postoperative nausea and vomiting)    the patch helps!  . Urinary frequency   . Urinary urgency    was taking Myrbetriq but stopped.Plans to start back    Past Surgical History:  Procedure Laterality Date  . ACHILLES TENDON SURGERY Left 11/17/2017   Procedure: ACHILLES TENDON REPAIR;  Surgeon: Melrose Nakayama, MD;  Location: [place];  Service: Orthopedics;  Laterality: Left;  . CHOLECYSTECTOMY  2000  . COLONOSCOPY    . EYE SURGERY Left    tear duct surgery  .  HAND TENDON SURGERY     Dr. Amedeo Plenty  . KNEE ARTHROSCOPY    . ORIF CALCANEOUS FRACTURE Left 11/17/2017   Procedure: OPEN TREATMENT OF CALCANEOUS FRACTURE;  Surgeon: Melrose Nakayama, MD;  Location: [place];  Service: Orthopedics;  Laterality: Left;  Patient is now an inpatient at [facility] will request that she be moved to [facility] and admitted there for this procedure to be done there.  . ORIF ELBOW FRACTURE Right 02/25/2017   Procedure: OPEN REDUCTION INTERNAL FIXATION (ORIF) RIGHT PROXIMAL OLECRANON FRACTURE;  Surgeon: Iran Planas, MD;  Location: [place];  Service: Orthopedics;  Laterality: Right;  . TOTAL KNEE ARTHROPLASTY Left 06/20/2015  . TOTAL KNEE ARTHROPLASTY Left 06/20/2015   Procedure: TOTAL KNEE ARTHROPLASTY;  Surgeon: Dorna Leitz, MD;  Location: [place];  Service:  Orthopedics;  Laterality: Left;  . TOTAL KNEE ARTHROPLASTY Right 05/28/2016   Procedure: TOTAL KNEE ARTHROPLASTY;  Surgeon: Dorna Leitz, MD;  Location: [place];  Service: Orthopedics;  Laterality: Right;    Allergies  Allergen Reactions  . Oxycodone Other (See Comments)    Makes hallucinates  . Sulfamethoxazole Nausea Only    Family History  Problem Relation Age of Onset  . Cancer Father        colon ca/ lung ca  . Heart disease Father     Social History   Socioeconomic History  . Marital status: Married    Spouse name: Not on file  . Number of children: 2  . Years of education: Not on file  . Highest education level: Not on file  Occupational History  . Occupation: Retired  Scientific laboratory technician  . Financial resource strain: Not on file  . Food insecurity:    Worry: Not on file    Inability: Not on file  . Transportation needs:    Medical: Not on file    Non-medical: Not on file  Tobacco Use  . Smoking status: Former Smoker    Last attempt to quit: 06/14/1978    Years since quitting: 40.2  . Smokeless tobacco: Never Used  . Tobacco comment: quit 32 yrs ago  Substance and Sexual Activity  . Alcohol use: Yes    Comment: occ  .  Drug use: No  . Sexual activity: Never  Lifestyle  . Physical activity:    Days per week: Not on file    Minutes per session: Not on file  . Stress: Not on file  Relationships  . Social connections:    Talks on phone: Not on file    Gets together: Not on file    Attends religious service: Not on file    Active member of club or organization: Not on file    Attends meetings of clubs or organizations: Not on file    Relationship status: Not on file  Other Topics Concern  . Not on file  Social History Narrative   She lives in [place] with her husband.     Vitals:   08/29/18 1033  BP: 118/80  Pulse: 80  Resp: 12  Temp: 97.7 F (36.5 C)  SpO2: 97%   Body mass index is 26.5 kg/m.   Wt Readings from Last 3 Encounters:  08/29/18 154 lb 6 oz (70 kg)  02/24/18 147 lb 8 oz (66.9 kg)  11/21/17 166 lb 7.2 oz (75.5 kg)    Physical Exam  Nursing note and vitals reviewed. Constitutional: She is oriented to person, place, and time. She appears well-developed and well-nourished. No distress.  HENT:  Head: Normocephalic and atraumatic.  Mouth/Throat: Oropharynx is clear and moist and mucous membranes are normal.  Eyes: Pupils are equal, round, and reactive to light. Conjunctivae are normal.  Neck: No tracheal deviation present. No thyroid mass present.  Cardiovascular: Normal rate and regular rhythm.  No murmur heard. DP pulses present.  Respiratory: Effort normal and breath sounds normal. No respiratory distress.  GI: Soft. She exhibits no mass. There is no hepatomegaly. There is no abdominal tenderness.  Musculoskeletal:        General: No edema.  Lymphadenopathy:    She has no cervical adenopathy.  Neurological: She is alert and oriented to person, place, and time. She has normal strength. No cranial nerve deficit. Gait abnormal.  Reflex Scores:      Bicep reflexes are 2+ on the  right side and 2+ on the left side.      Patellar reflexes are 2+ on the right side and 2+  on the left side. Gait assisted by a cane, antalgic.  Skin: Skin is warm. No rash noted. No erythema.  Psychiatric: She has a normal mood and affect.  Well groomed, good eye contact.    ASSESSMENT AND PLAN:  Ms. Achol Azpeitia was here today annual physical examination.  Diagnoses and all orders for this visit:  Routine general medical examination at a health care facility We discussed the importance of regular physical activity and healthy diet for prevention of chronic illness and/or complications. Preventive guidelines reviewed. Vaccination up to date. Fall precautions. Ca++ and vit D supplementation recommended.She is following with rheuma for osteoporosis. Next CPE in a year.  Medicare annual wellness visit, subsequent We discussed the importance of staying active, physically and mentally, as well as the benefits of a healthy/balnace diet. Low impact exercise that involve stretching and strengthing are ideal.  We discussed preventive screening for the next 5-10 years, summery of recommendations given in AVS: Fall prevention.  Advance directives and end of life discussed, she has POA and living will.   Hypertension, essential, benign BP adequately controlled. No changes in current management. Recommend monitoring BP at home regularly. Eye exam is current. Because she is following with rheumatologist every 3 months + lab work, I will see her back in a year, before if needed.  CKD (chronic kidney disease), stage III ([place]) Probably has been stable. Continue low-salt diet and adequate hydration. Creatinine baseline from 1.0 to 1.9, for the past few months he has been in normal range. eGFR in the low 50s.    Hypothyroidism (acquired) Well-controlled. No changes in current management. Annual follow-up.  Hypercholesterolemia Well-controlled on nonpharmacologic treatment.  Copy of lab results given.   Return in about 1 year (around 08/29/2019) for CPE and F/U.       G. Martinique, MD  [facility]. [place] office.

## 2018-08-29 NOTE — Assessment & Plan Note (Signed)
Well-controlled on nonpharmacologic treatment.

## 2018-08-29 NOTE — Assessment & Plan Note (Signed)
Probably has been stable. Continue low-salt diet and adequate hydration. Creatinine baseline from 1.0 to 1.9, for the past few months he has been in normal range. eGFR in the low 50s.

## 2018-08-29 NOTE — Assessment & Plan Note (Signed)
Well-controlled. No changes in current management. Annual follow-up.

## 2018-08-29 NOTE — Patient Instructions (Addendum)
A few things to remember from today's visit:   No diagnosis found.   A few tips:  -As we age balance is not as good as it was, so there is a higher risks for falls. Please remove small rugs and furniture that is "in your way" and could increase the risk of falls. Stretching exercises may help with fall prevention: Yoga and Tai Chi are some examples. Low impact exercise is better, so you are not very achy the next day.  -Sun screen and avoidance of direct sun light recommended. Caution with dehydration, if working outdoors be sure to drink enough fluids.  - Some medications are not safe as we age, increases the risk of side effects and can potentially interact with other medication you are also taken;  including some of over the counter medications. Be sure to let me know when you start a new medication even if it is a dietary/vitamin supplement.   -Healthy diet low in red meet/animal fat and sugar + regular physical activity is recommended.       Screening schedule for the next 5-10 years:  Glaucoma screening/eye exam every annually.  Flu vaccine annually.  Fall prevention  Please be sure medication list is accurate. If a new problem present, please set up appointment sooner than planned today.

## 2018-08-29 NOTE — Assessment & Plan Note (Signed)
BP adequately controlled. No changes in current management. Recommend monitoring BP at home regularly. Eye exam is current. Because she is following with rheumatologist every 3 months + lab work, I will see her back in a year, before if needed.

## 2018-09-14 DIAGNOSIS — R350 Frequency of micturition: Secondary | ICD-10-CM | POA: Diagnosis not present

## 2018-09-27 DIAGNOSIS — L578 Other skin changes due to chronic exposure to nonionizing radiation: Secondary | ICD-10-CM | POA: Diagnosis not present

## 2018-09-27 DIAGNOSIS — D044 Carcinoma in situ of skin of scalp and neck: Secondary | ICD-10-CM | POA: Diagnosis not present

## 2018-09-27 DIAGNOSIS — L570 Actinic keratosis: Secondary | ICD-10-CM | POA: Diagnosis not present

## 2018-09-27 DIAGNOSIS — D1801 Hemangioma of skin and subcutaneous tissue: Secondary | ICD-10-CM | POA: Diagnosis not present

## 2018-09-27 DIAGNOSIS — C4442 Squamous cell carcinoma of skin of scalp and neck: Secondary | ICD-10-CM | POA: Diagnosis not present

## 2018-09-27 DIAGNOSIS — Z85828 Personal history of other malignant neoplasm of skin: Secondary | ICD-10-CM | POA: Diagnosis not present

## 2018-10-05 DIAGNOSIS — M0589 Other rheumatoid arthritis with rheumatoid factor of multiple sites: Secondary | ICD-10-CM | POA: Diagnosis not present

## 2018-10-05 DIAGNOSIS — Z79899 Other long term (current) drug therapy: Secondary | ICD-10-CM | POA: Diagnosis not present

## 2018-10-31 IMAGING — CR DG PELVIS 1-2V
1 series · 1 of 1 positions shown · non-contrast
Comparison: Abdominopelvic CT 05/05/2015

CLINICAL DATA: Fall coming in from garage tonight.

EXAM:
PELVIS - 1-2 VIEW

[t pelvis ap]
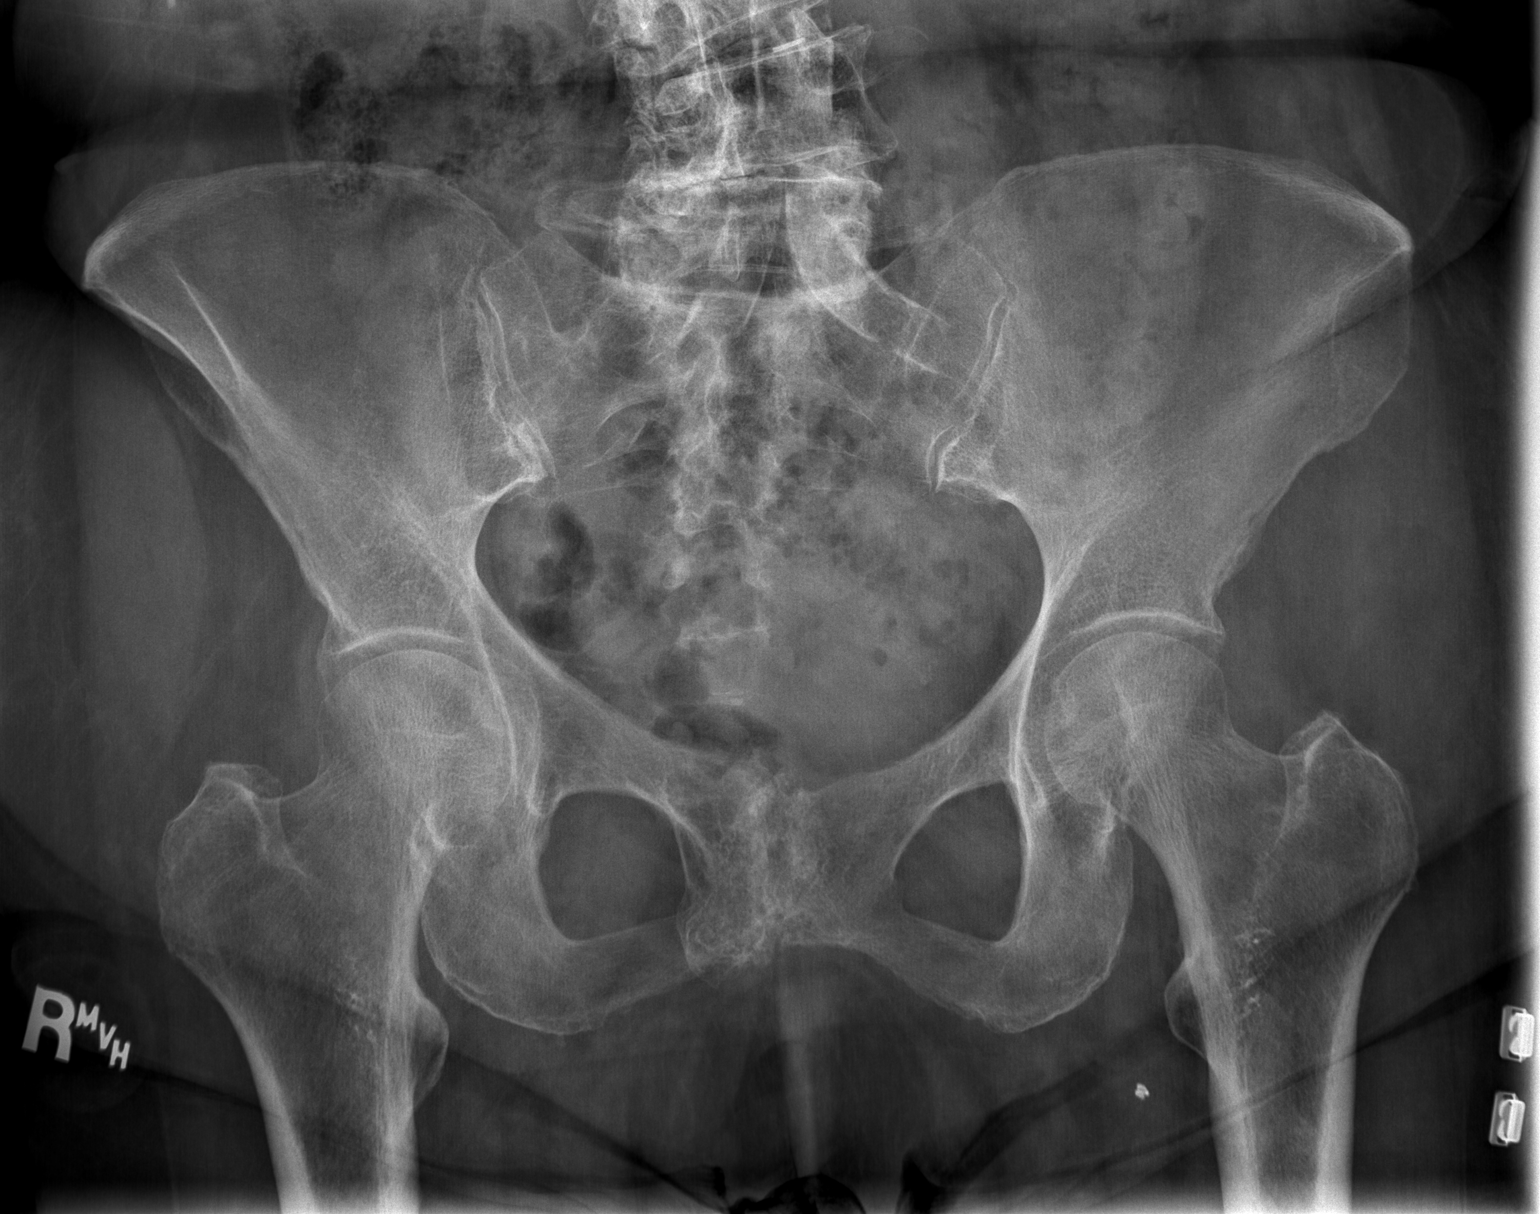

[1 of 1 positions shown; findings below may reference images not displayed]

FINDINGS: Remote right pubic body fracture, pubic symphysis is congruent. No
acute fracture. Both femoral heads are well seated in the respective
acetabula. Sacroiliac joints are congruent.
IMPRESSION: 1. No acute pelvic fracture.
2. Remote right pubic body fracture.

## 2018-10-31 IMAGING — CR DG LUMBAR SPINE COMPLETE 4+V
5 series · 5 of 5 positions shown · non-contrast
Comparison: Lumbar spine MRI 09/06/2017

CLINICAL DATA: Fall coming in from garage, lumbosacral back pain.

EXAM:
LUMBAR SPINE - COMPLETE 4+ VIEW

[t lumbar spine ap]
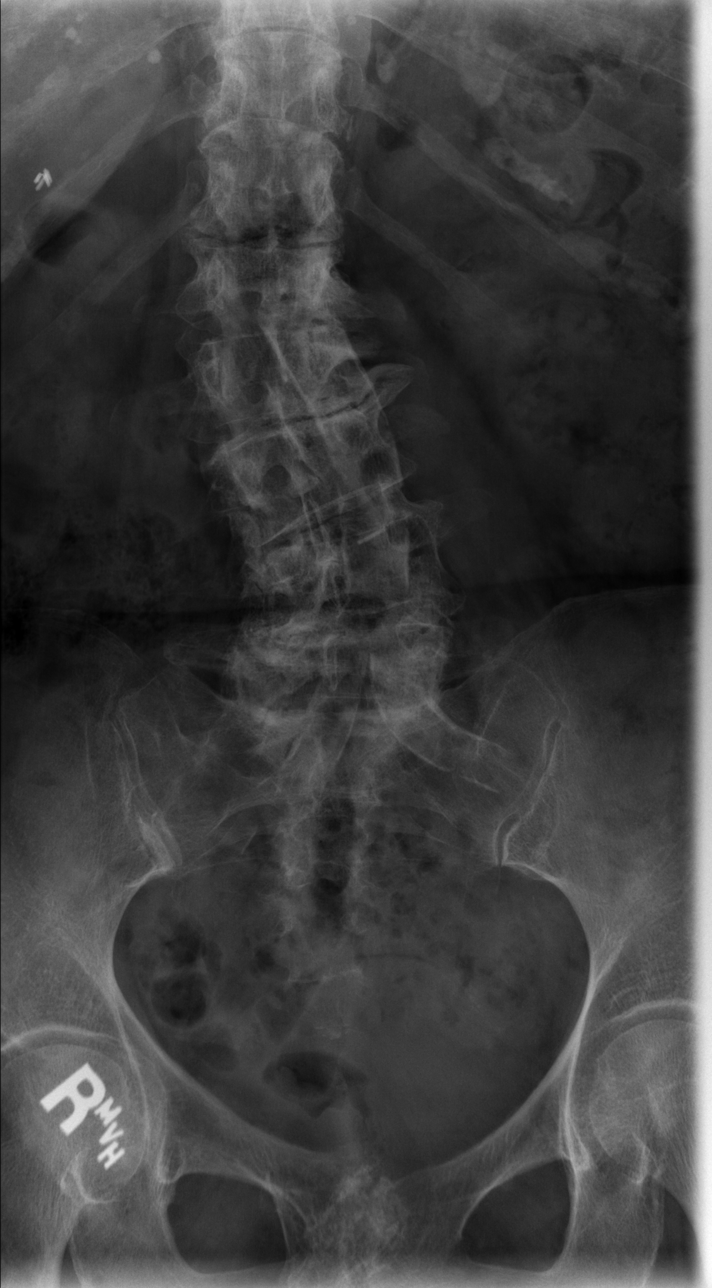

[t lumbar spine obl (1 of 2)]
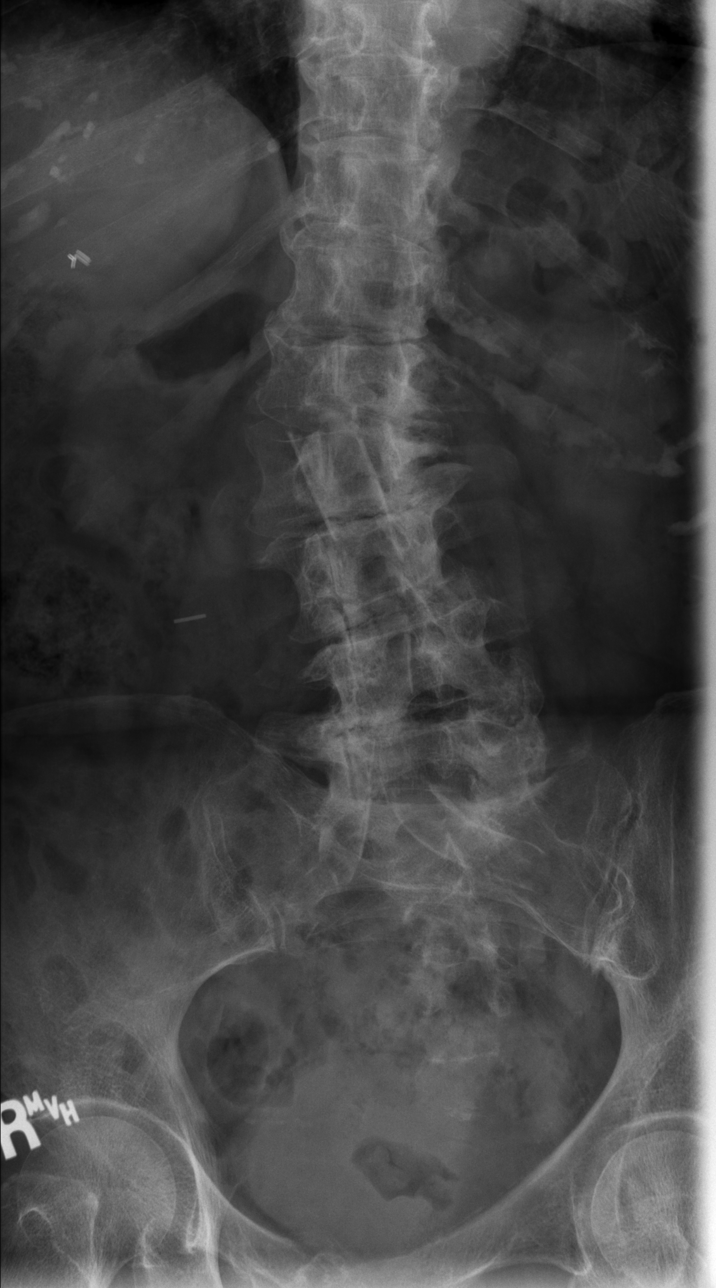

[t lumbar spine obl (2 of 2)]
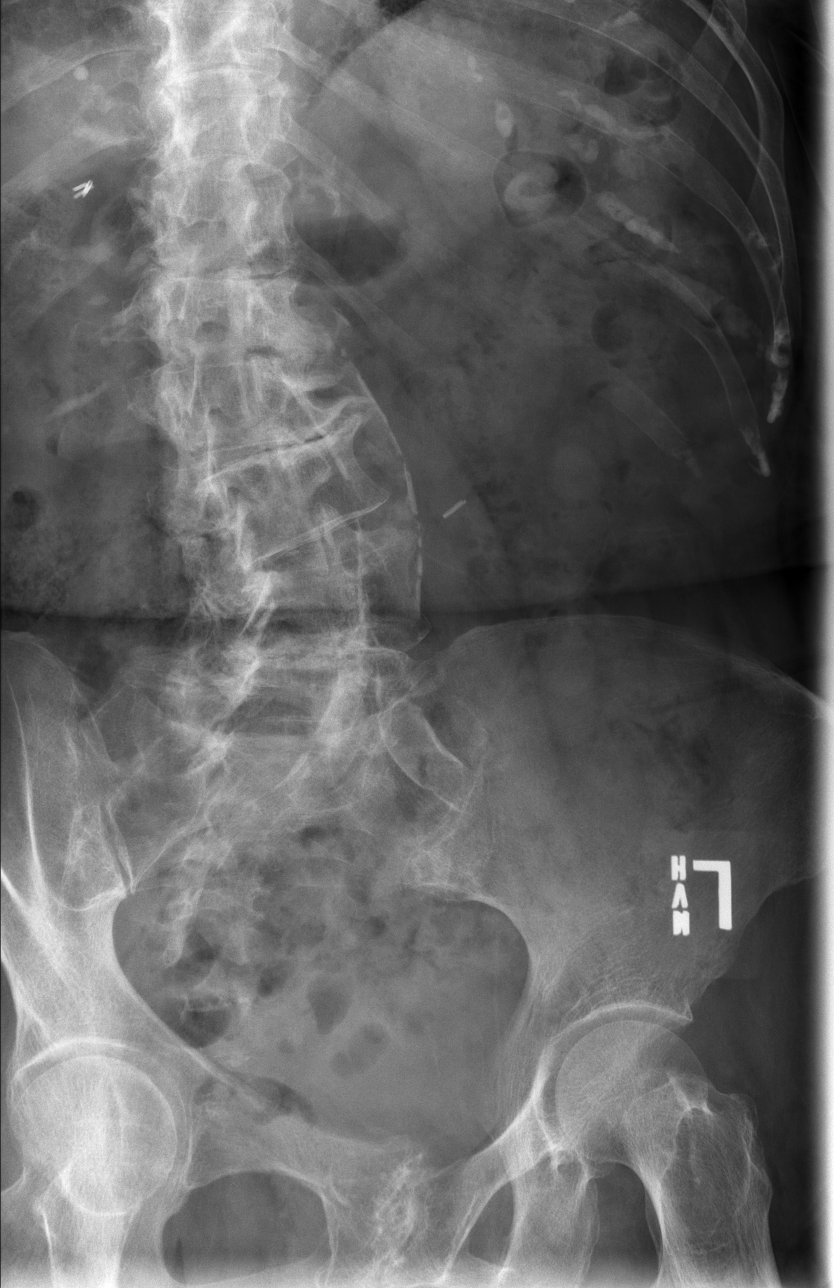

[t lumbar spine lat]
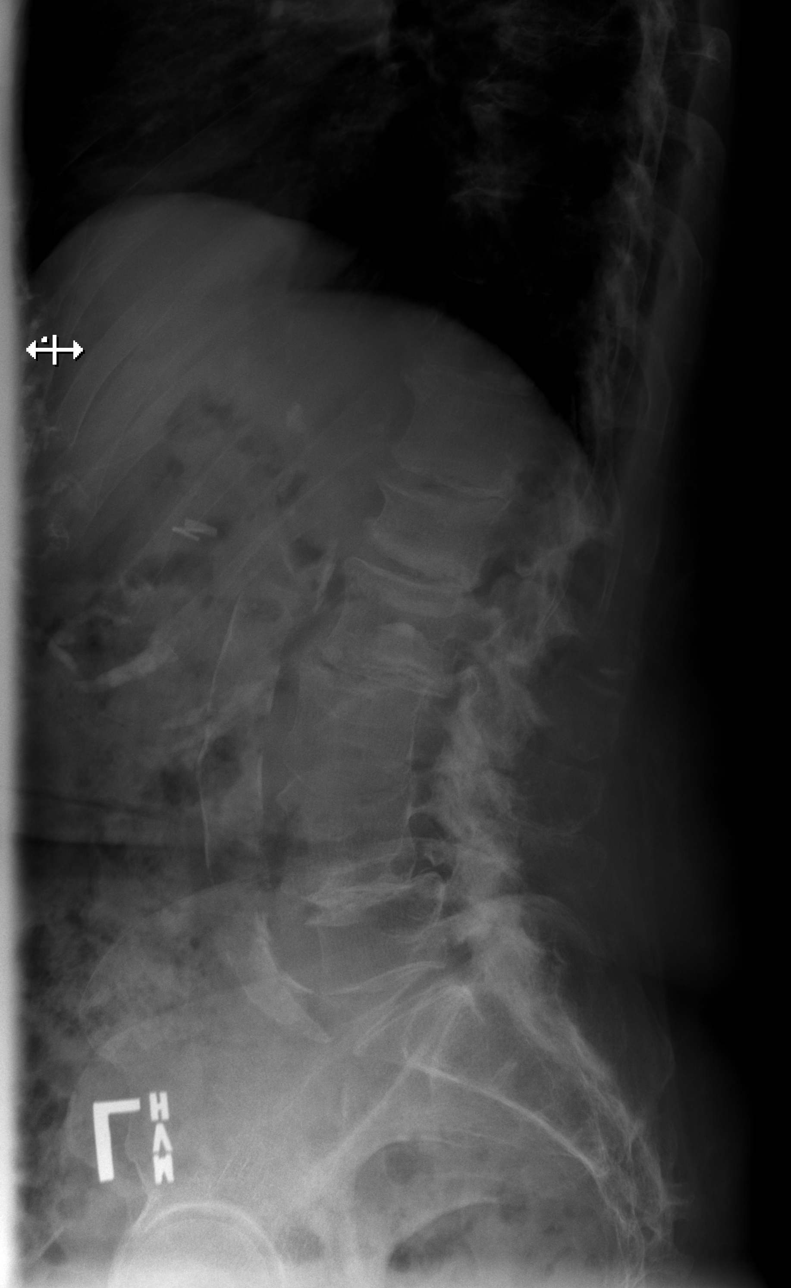

[t lumbar l-5 s-1 spot]
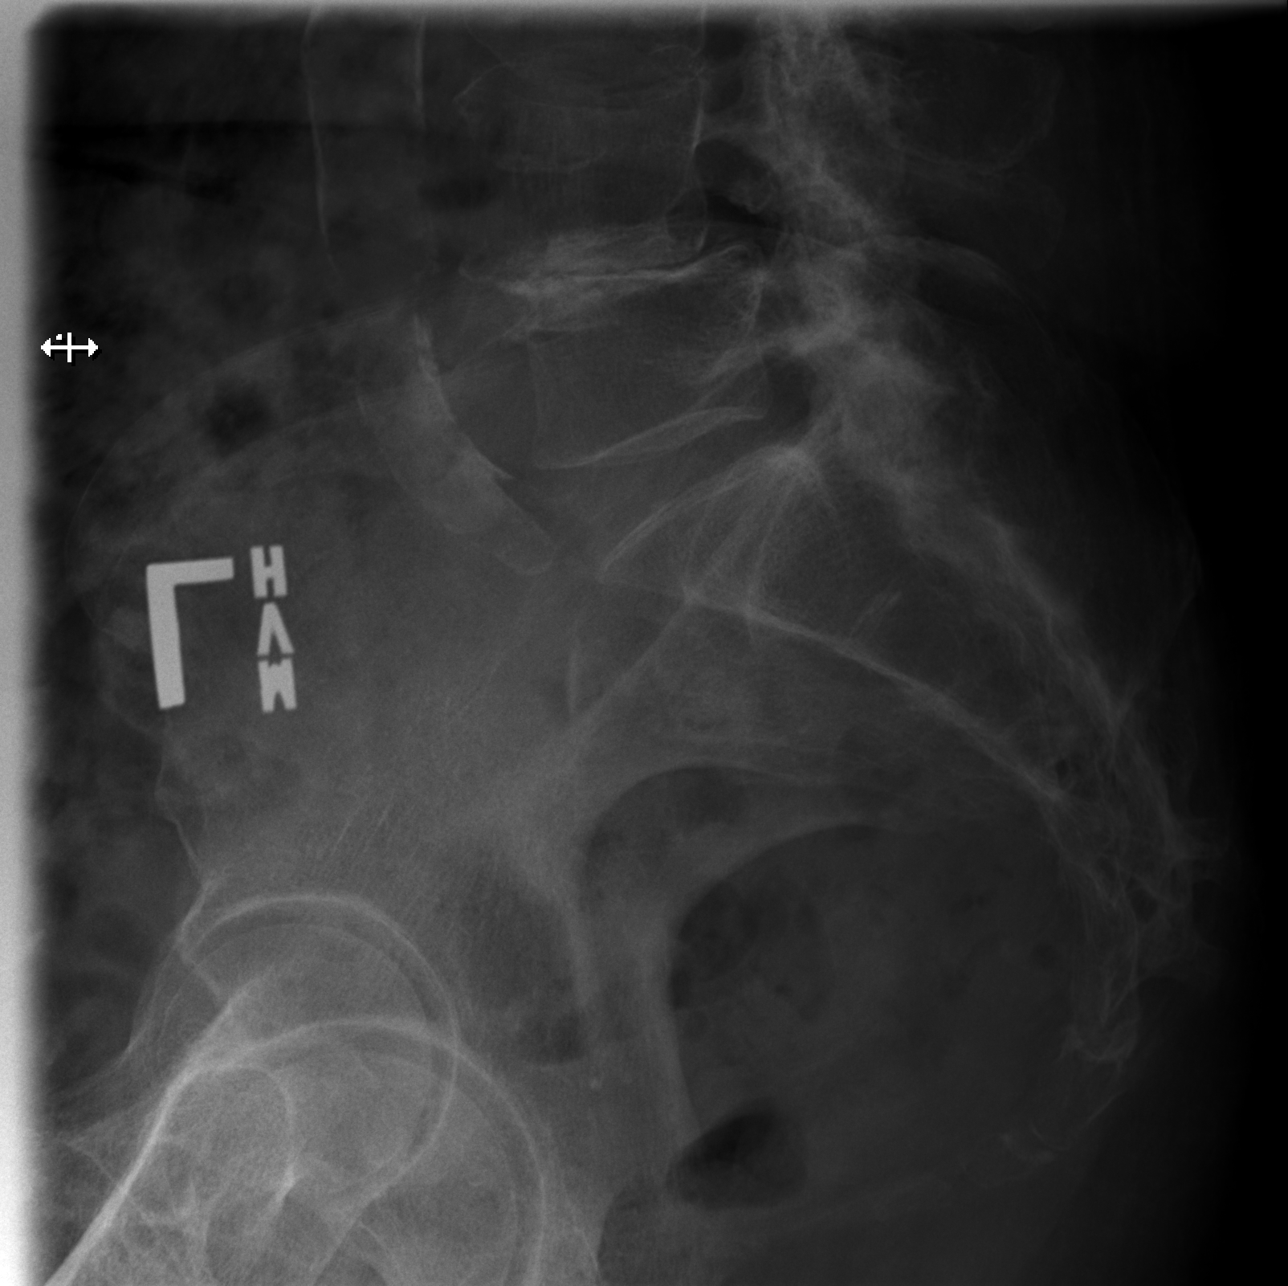

[5 of 5 positions shown; findings below may reference images not displayed]

FINDINGS: Moderate levo scoliotic curvature of the lumbar spine.
Anterolisthesis of L4 on L5 retrolisthesis of L2 on L3 appears
degenerative. Remote L1 compression fracture. Remaining lumbar
vertebral body heights are preserved. Diffuse disc space narrowing
and endplate spurring and facet arthropathy. The bones are under
mineralized. Aorto bi-iliac atherosclerosis is incidentally noted.
IMPRESSION: 1. No fracture or acute osseous abnormality of the lumbar spine.
2. Scoliosis and multilevel degenerative change. Remote L1
compression fracture.

## 2018-11-02 DIAGNOSIS — R350 Frequency of micturition: Secondary | ICD-10-CM | POA: Diagnosis not present

## 2018-11-21 DIAGNOSIS — M0589 Other rheumatoid arthritis with rheumatoid factor of multiple sites: Secondary | ICD-10-CM | POA: Diagnosis not present

## 2018-11-21 DIAGNOSIS — G894 Chronic pain syndrome: Secondary | ICD-10-CM | POA: Diagnosis not present

## 2018-11-21 DIAGNOSIS — S86012D Strain of left Achilles tendon, subsequent encounter: Secondary | ICD-10-CM | POA: Diagnosis not present

## 2018-11-21 DIAGNOSIS — M150 Primary generalized (osteo)arthritis: Secondary | ICD-10-CM | POA: Diagnosis not present

## 2018-11-21 DIAGNOSIS — Z79899 Other long term (current) drug therapy: Secondary | ICD-10-CM | POA: Diagnosis not present

## 2018-12-13 DIAGNOSIS — L570 Actinic keratosis: Secondary | ICD-10-CM | POA: Diagnosis not present

## 2018-12-26 ENCOUNTER — Other Ambulatory Visit: Payer: Self-pay | Admitting: Family Medicine

## 2019-02-12 DIAGNOSIS — H26492 Other secondary cataract, left eye: Secondary | ICD-10-CM | POA: Diagnosis not present

## 2019-02-12 DIAGNOSIS — H5203 Hypermetropia, bilateral: Secondary | ICD-10-CM | POA: Diagnosis not present

## 2019-02-21 DIAGNOSIS — R350 Frequency of micturition: Secondary | ICD-10-CM | POA: Diagnosis not present

## 2019-02-23 DIAGNOSIS — G894 Chronic pain syndrome: Secondary | ICD-10-CM | POA: Diagnosis not present

## 2019-02-23 DIAGNOSIS — M0589 Other rheumatoid arthritis with rheumatoid factor of multiple sites: Secondary | ICD-10-CM | POA: Diagnosis not present

## 2019-02-23 DIAGNOSIS — M150 Primary generalized (osteo)arthritis: Secondary | ICD-10-CM | POA: Diagnosis not present

## 2019-02-23 DIAGNOSIS — S86012D Strain of left Achilles tendon, subsequent encounter: Secondary | ICD-10-CM | POA: Diagnosis not present

## 2019-02-23 DIAGNOSIS — Z79899 Other long term (current) drug therapy: Secondary | ICD-10-CM | POA: Diagnosis not present

## 2019-03-12 DIAGNOSIS — M47816 Spondylosis without myelopathy or radiculopathy, lumbar region: Secondary | ICD-10-CM | POA: Diagnosis not present

## 2019-03-21 ENCOUNTER — Other Ambulatory Visit: Payer: Self-pay | Admitting: Family Medicine

## 2019-03-22 DIAGNOSIS — R350 Frequency of micturition: Secondary | ICD-10-CM | POA: Diagnosis not present

## 2019-03-26 NOTE — Telephone Encounter (Signed)
I have not addressed B12 deficiency with Ms Jennifer Goodman. It seems like Rx was given in 12/2017. I do not see a recent B12 level.  Please arrange lab appt. Thanks, BJ

## 2019-03-27 ENCOUNTER — Other Ambulatory Visit: Payer: Self-pay | Admitting: Family Medicine

## 2019-03-27 DIAGNOSIS — R350 Frequency of micturition: Secondary | ICD-10-CM | POA: Diagnosis not present

## 2019-03-27 NOTE — Telephone Encounter (Signed)
Left message to return call to office to schedule appointment. 

## 2019-03-28 ENCOUNTER — Other Ambulatory Visit: Payer: Self-pay | Admitting: *Deleted

## 2019-03-28 ENCOUNTER — Telehealth: Payer: Self-pay | Admitting: *Deleted

## 2019-03-28 DIAGNOSIS — E538 Deficiency of other specified B group vitamins: Secondary | ICD-10-CM

## 2019-03-28 NOTE — Telephone Encounter (Signed)
Returned call to patient and informed her that per Dr. Martinique she needed to have B 12 level checked. Patient verbalized understanding and scheduled lab appointment for 04/11/2019. Lab ordered placed.   Copied from [place] [id number]. Topic: General - Other >> Mar 28, 2019 11:39 AM Celene Kras A wrote: Reason for CRM: Pt called and is requesting to know why her medication was denied. Please advise.

## 2019-04-03 DIAGNOSIS — Z85828 Personal history of other malignant neoplasm of skin: Secondary | ICD-10-CM | POA: Diagnosis not present

## 2019-04-03 DIAGNOSIS — D225 Melanocytic nevi of trunk: Secondary | ICD-10-CM | POA: Diagnosis not present

## 2019-04-03 DIAGNOSIS — L814 Other melanin hyperpigmentation: Secondary | ICD-10-CM | POA: Diagnosis not present

## 2019-04-03 DIAGNOSIS — L570 Actinic keratosis: Secondary | ICD-10-CM | POA: Diagnosis not present

## 2019-04-03 DIAGNOSIS — L821 Other seborrheic keratosis: Secondary | ICD-10-CM | POA: Diagnosis not present

## 2019-04-04 DIAGNOSIS — M47816 Spondylosis without myelopathy or radiculopathy, lumbar region: Secondary | ICD-10-CM | POA: Diagnosis not present

## 2019-04-11 ENCOUNTER — Other Ambulatory Visit (INDEPENDENT_AMBULATORY_CARE_PROVIDER_SITE_OTHER): Payer: Medicare Other

## 2019-04-11 ENCOUNTER — Other Ambulatory Visit: Payer: Self-pay

## 2019-04-11 DIAGNOSIS — Z23 Encounter for immunization: Secondary | ICD-10-CM

## 2019-04-11 DIAGNOSIS — E538 Deficiency of other specified B group vitamins: Secondary | ICD-10-CM

## 2019-04-12 LAB — VITAMIN B12: Vitamin B-12: 627 pg/mL (ref 211–911)

## 2019-04-24 DIAGNOSIS — R350 Frequency of micturition: Secondary | ICD-10-CM | POA: Diagnosis not present

## 2019-04-26 DIAGNOSIS — M67912 Unspecified disorder of synovium and tendon, left shoulder: Secondary | ICD-10-CM | POA: Diagnosis not present

## 2019-04-26 DIAGNOSIS — M542 Cervicalgia: Secondary | ICD-10-CM | POA: Diagnosis not present

## 2019-04-26 DIAGNOSIS — M5442 Lumbago with sciatica, left side: Secondary | ICD-10-CM | POA: Diagnosis not present

## 2019-04-26 DIAGNOSIS — M545 Low back pain: Secondary | ICD-10-CM | POA: Diagnosis not present

## 2019-05-03 DIAGNOSIS — M25612 Stiffness of left shoulder, not elsewhere classified: Secondary | ICD-10-CM | POA: Diagnosis not present

## 2019-05-03 DIAGNOSIS — M67912 Unspecified disorder of synovium and tendon, left shoulder: Secondary | ICD-10-CM | POA: Diagnosis not present

## 2019-05-15 DIAGNOSIS — M67912 Unspecified disorder of synovium and tendon, left shoulder: Secondary | ICD-10-CM | POA: Diagnosis not present

## 2019-05-15 DIAGNOSIS — M25612 Stiffness of left shoulder, not elsewhere classified: Secondary | ICD-10-CM | POA: Diagnosis not present

## 2019-05-16 DIAGNOSIS — N390 Urinary tract infection, site not specified: Secondary | ICD-10-CM | POA: Diagnosis not present

## 2019-05-16 DIAGNOSIS — R350 Frequency of micturition: Secondary | ICD-10-CM | POA: Diagnosis not present

## 2019-05-17 DIAGNOSIS — M67912 Unspecified disorder of synovium and tendon, left shoulder: Secondary | ICD-10-CM | POA: Diagnosis not present

## 2019-05-17 DIAGNOSIS — M25612 Stiffness of left shoulder, not elsewhere classified: Secondary | ICD-10-CM | POA: Diagnosis not present

## 2019-05-22 DIAGNOSIS — M25612 Stiffness of left shoulder, not elsewhere classified: Secondary | ICD-10-CM | POA: Diagnosis not present

## 2019-05-22 DIAGNOSIS — M67912 Unspecified disorder of synovium and tendon, left shoulder: Secondary | ICD-10-CM | POA: Diagnosis not present

## 2019-05-24 DIAGNOSIS — M67912 Unspecified disorder of synovium and tendon, left shoulder: Secondary | ICD-10-CM | POA: Diagnosis not present

## 2019-05-24 DIAGNOSIS — M25612 Stiffness of left shoulder, not elsewhere classified: Secondary | ICD-10-CM | POA: Diagnosis not present

## 2019-05-25 DIAGNOSIS — M0589 Other rheumatoid arthritis with rheumatoid factor of multiple sites: Secondary | ICD-10-CM | POA: Diagnosis not present

## 2019-05-25 DIAGNOSIS — S86012D Strain of left Achilles tendon, subsequent encounter: Secondary | ICD-10-CM | POA: Diagnosis not present

## 2019-05-25 DIAGNOSIS — Z79899 Other long term (current) drug therapy: Secondary | ICD-10-CM | POA: Diagnosis not present

## 2019-05-25 DIAGNOSIS — G894 Chronic pain syndrome: Secondary | ICD-10-CM | POA: Diagnosis not present

## 2019-05-25 DIAGNOSIS — M150 Primary generalized (osteo)arthritis: Secondary | ICD-10-CM | POA: Diagnosis not present

## 2019-05-25 DIAGNOSIS — E538 Deficiency of other specified B group vitamins: Secondary | ICD-10-CM | POA: Diagnosis not present

## 2019-05-25 LAB — CBC AND DIFFERENTIAL
HCT: 34 — AB (ref 36–46)
Hemoglobin: 11.6 — AB (ref 12.0–16.0)
Platelets: 310 (ref 150–399)
WBC: 7

## 2019-05-25 LAB — BASIC METABOLIC PANEL
BUN: 22 — AB (ref 4–21)
CO2: 21 (ref 13–22)
Chloride: 104 (ref 99–108)
Creatinine: 1.1 (ref 0.5–1.1)
Glucose: 105
Potassium: 4.4 (ref 3.4–5.3)
Sodium: 140 (ref 137–147)

## 2019-05-25 LAB — COMPREHENSIVE METABOLIC PANEL
Albumin: 4 (ref 3.5–5.0)
Calcium: 9 (ref 8.7–10.7)
Globulin: 2

## 2019-05-25 LAB — CBC: RBC: 3.56 — AB (ref 3.87–5.11)

## 2019-05-25 LAB — HEPATIC FUNCTION PANEL
ALT: 31 (ref 7–35)
AST: 33 (ref 13–35)
Alkaline Phosphatase: 63 (ref 25–125)
Bilirubin, Total: 0.7

## 2019-05-25 LAB — IRON,TIBC AND FERRITIN PANEL
%SAT: 13
Ferritin: 52
Iron: 42
TIBC: 332
UIBC: 290

## 2019-05-29 ENCOUNTER — Encounter: Payer: Self-pay | Admitting: Family Medicine

## 2019-05-29 DIAGNOSIS — M67912 Unspecified disorder of synovium and tendon, left shoulder: Secondary | ICD-10-CM | POA: Diagnosis not present

## 2019-05-29 DIAGNOSIS — M25612 Stiffness of left shoulder, not elsewhere classified: Secondary | ICD-10-CM | POA: Diagnosis not present

## 2019-05-31 DIAGNOSIS — M67912 Unspecified disorder of synovium and tendon, left shoulder: Secondary | ICD-10-CM | POA: Diagnosis not present

## 2019-05-31 DIAGNOSIS — M25612 Stiffness of left shoulder, not elsewhere classified: Secondary | ICD-10-CM | POA: Diagnosis not present

## 2019-06-05 DIAGNOSIS — M25612 Stiffness of left shoulder, not elsewhere classified: Secondary | ICD-10-CM | POA: Diagnosis not present

## 2019-06-05 DIAGNOSIS — M67912 Unspecified disorder of synovium and tendon, left shoulder: Secondary | ICD-10-CM | POA: Diagnosis not present

## 2019-06-07 DIAGNOSIS — M25612 Stiffness of left shoulder, not elsewhere classified: Secondary | ICD-10-CM | POA: Diagnosis not present

## 2019-06-07 DIAGNOSIS — M67912 Unspecified disorder of synovium and tendon, left shoulder: Secondary | ICD-10-CM | POA: Diagnosis not present

## 2019-06-12 DIAGNOSIS — M25612 Stiffness of left shoulder, not elsewhere classified: Secondary | ICD-10-CM | POA: Diagnosis not present

## 2019-06-12 DIAGNOSIS — M67912 Unspecified disorder of synovium and tendon, left shoulder: Secondary | ICD-10-CM | POA: Diagnosis not present

## 2019-06-19 DIAGNOSIS — M25612 Stiffness of left shoulder, not elsewhere classified: Secondary | ICD-10-CM | POA: Diagnosis not present

## 2019-06-19 DIAGNOSIS — M67912 Unspecified disorder of synovium and tendon, left shoulder: Secondary | ICD-10-CM | POA: Diagnosis not present

## 2019-06-21 DIAGNOSIS — M67912 Unspecified disorder of synovium and tendon, left shoulder: Secondary | ICD-10-CM | POA: Diagnosis not present

## 2019-06-21 DIAGNOSIS — M25612 Stiffness of left shoulder, not elsewhere classified: Secondary | ICD-10-CM | POA: Diagnosis not present

## 2019-06-21 DIAGNOSIS — R350 Frequency of micturition: Secondary | ICD-10-CM | POA: Diagnosis not present

## 2019-06-26 DIAGNOSIS — M67912 Unspecified disorder of synovium and tendon, left shoulder: Secondary | ICD-10-CM | POA: Diagnosis not present

## 2019-06-26 DIAGNOSIS — M25612 Stiffness of left shoulder, not elsewhere classified: Secondary | ICD-10-CM | POA: Diagnosis not present

## 2019-06-28 DIAGNOSIS — M25612 Stiffness of left shoulder, not elsewhere classified: Secondary | ICD-10-CM | POA: Diagnosis not present

## 2019-06-28 DIAGNOSIS — M67912 Unspecified disorder of synovium and tendon, left shoulder: Secondary | ICD-10-CM | POA: Diagnosis not present

## 2019-07-03 DIAGNOSIS — M67912 Unspecified disorder of synovium and tendon, left shoulder: Secondary | ICD-10-CM | POA: Diagnosis not present

## 2019-07-03 DIAGNOSIS — M25612 Stiffness of left shoulder, not elsewhere classified: Secondary | ICD-10-CM | POA: Diagnosis not present

## 2019-07-05 DIAGNOSIS — M25612 Stiffness of left shoulder, not elsewhere classified: Secondary | ICD-10-CM | POA: Diagnosis not present

## 2019-07-05 DIAGNOSIS — M67912 Unspecified disorder of synovium and tendon, left shoulder: Secondary | ICD-10-CM | POA: Diagnosis not present

## 2019-07-10 DIAGNOSIS — M67912 Unspecified disorder of synovium and tendon, left shoulder: Secondary | ICD-10-CM | POA: Diagnosis not present

## 2019-07-10 DIAGNOSIS — M25612 Stiffness of left shoulder, not elsewhere classified: Secondary | ICD-10-CM | POA: Diagnosis not present

## 2019-07-12 DIAGNOSIS — M25612 Stiffness of left shoulder, not elsewhere classified: Secondary | ICD-10-CM | POA: Diagnosis not present

## 2019-07-12 DIAGNOSIS — M67912 Unspecified disorder of synovium and tendon, left shoulder: Secondary | ICD-10-CM | POA: Diagnosis not present

## 2019-07-29 ENCOUNTER — Ambulatory Visit: Payer: Medicare Other | Attending: Internal Medicine

## 2019-07-29 DIAGNOSIS — Z23 Encounter for immunization: Secondary | ICD-10-CM | POA: Insufficient documentation

## 2019-07-29 NOTE — Progress Notes (Signed)
   Covid-19 Vaccination Clinic  Name:  Jennifer Goodman    MRN: [id number] DOB: 08-Mar-1935  07/29/2019  Jennifer Goodman was observed post Covid-19 immunization for 15 minutes without incidence. She was provided with Vaccine Information Sheet and instruction to access the V-Safe system.   Jennifer Goodman was instructed to call 911 with any severe reactions post vaccine: Marland Kitchen Difficulty breathing  . Swelling of your face and throat  . A fast heartbeat  . A bad rash all over your body  . Dizziness and weakness    Immunizations Administered    Name Date Dose VIS Date Route   Pfizer COVID-19 Vaccine 07/29/2019  1:19 PM 0.3 mL 05/25/2019 Intramuscular   Manufacturer: [place]   Lot: [id number]   [place]: [id number]

## 2019-08-15 DIAGNOSIS — R350 Frequency of micturition: Secondary | ICD-10-CM | POA: Diagnosis not present

## 2019-08-21 ENCOUNTER — Ambulatory Visit: Payer: Medicare Other | Attending: Internal Medicine

## 2019-08-21 DIAGNOSIS — Z23 Encounter for immunization: Secondary | ICD-10-CM

## 2019-08-21 NOTE — Progress Notes (Signed)
   Covid-19 Vaccination Clinic  Name:  Jennifer Goodman    MRN: [id number] DOB: Oct 01, 1934  08/21/2019  [name] was observed post Covid-19 immunization for 15 minutes without incident. She was provided with Vaccine Information Sheet and instruction to access the V-Safe system.   [name] was instructed to call 911 with any severe reactions post vaccine: Marland Kitchen Difficulty breathing  . Swelling of face and throat  . A fast heartbeat  . A bad rash all over body  . Dizziness and weakness   Immunizations Administered    Name Date Dose VIS Date Route   Pfizer COVID-19 Vaccine 08/21/2019  8:27 AM 0.3 mL 05/25/2019 Intramuscular   Manufacturer: [place]   Lot: [id number]   [place]: [id number]

## 2019-08-22 ENCOUNTER — Ambulatory Visit: Payer: Medicare Other

## 2019-08-24 DIAGNOSIS — M0589 Other rheumatoid arthritis with rheumatoid factor of multiple sites: Secondary | ICD-10-CM | POA: Diagnosis not present

## 2019-08-24 DIAGNOSIS — G894 Chronic pain syndrome: Secondary | ICD-10-CM | POA: Diagnosis not present

## 2019-08-24 DIAGNOSIS — M150 Primary generalized (osteo)arthritis: Secondary | ICD-10-CM | POA: Diagnosis not present

## 2019-08-24 DIAGNOSIS — Z79899 Other long term (current) drug therapy: Secondary | ICD-10-CM | POA: Diagnosis not present

## 2019-08-24 DIAGNOSIS — S86012D Strain of left Achilles tendon, subsequent encounter: Secondary | ICD-10-CM | POA: Diagnosis not present

## 2019-08-29 ENCOUNTER — Encounter: Payer: Medicare Other | Admitting: Family Medicine

## 2019-09-03 ENCOUNTER — Other Ambulatory Visit: Payer: Self-pay

## 2019-09-04 ENCOUNTER — Encounter: Payer: Self-pay | Admitting: Family Medicine

## 2019-09-04 ENCOUNTER — Ambulatory Visit (INDEPENDENT_AMBULATORY_CARE_PROVIDER_SITE_OTHER): Payer: Medicare Other | Admitting: Family Medicine

## 2019-09-04 VITALS — BP 138/70 | HR 74 | Temp 95.4°F | Resp 16 | Ht 64.0 in | Wt 152.8 lb

## 2019-09-04 DIAGNOSIS — E538 Deficiency of other specified B group vitamins: Secondary | ICD-10-CM

## 2019-09-04 DIAGNOSIS — I480 Paroxysmal atrial fibrillation: Secondary | ICD-10-CM | POA: Diagnosis not present

## 2019-09-04 DIAGNOSIS — E039 Hypothyroidism, unspecified: Secondary | ICD-10-CM | POA: Diagnosis not present

## 2019-09-04 DIAGNOSIS — I10 Essential (primary) hypertension: Secondary | ICD-10-CM

## 2019-09-04 DIAGNOSIS — N1831 Chronic kidney disease, stage 3a: Secondary | ICD-10-CM

## 2019-09-04 DIAGNOSIS — Z0000 Encounter for general adult medical examination without abnormal findings: Secondary | ICD-10-CM

## 2019-09-04 LAB — TSH: TSH: 2.54 u[IU]/mL (ref 0.35–4.50)

## 2019-09-04 LAB — VITAMIN B12: Vitamin B-12: 628 pg/mL (ref 211–911)

## 2019-09-04 MED ORDER — IRBESARTAN 150 MG PO TABS: 150.0000 mg | ORAL_TABLET | Freq: Every day | ORAL | 3 refills | Status: DC

## 2019-09-04 NOTE — Progress Notes (Signed)
HPI:   Jennifer Goodman is a 85 y.o. female, who is here today for her routine physical and AWV No new problems since her last visit.  Regular exercise 3 or more time per week: She is not exercising as much as she did before since 08/2018.  3 days ago she started walking the treadmill. Following a healthy diet: For the past year she has been eating more meat and fast food.  She is still cooking at home and eating plenty of vegetables. She lives with her husband.  Independent ADL's and IADL's. Negative for falls in the past year and denies depression symptoms.  Functional Status Survey: Is the patient deaf or have difficulty hearing?: No Does the patient have difficulty seeing, even when wearing glasses/contacts?: No Does the patient have difficulty concentrating, remembering, or making decisions?: No Does the patient have difficulty walking or climbing stairs?: Yes(Hx of OA) Does the patient have difficulty dressing or bathing?: No Does the patient have difficulty doing errands alone such as visiting a doctor's office or shopping?: No  She enjoys reading and cooking.  Fall Risk  09/04/2019 08/29/2018 02/24/2018  Falls in the past year? 0 1 Yes  Comment - - tripped in her kitchen.  Number falls in past yr: 0 0 1  Injury with Fall? 0 1 Yes  Risk for fall due to : Orthopedic patient Impaired balance/gait;Orthopedic patient Impaired mobility  Follow up Education provided Education provided -   Providers she sees regularly: Eye care provider: Dr. Could, she follows annually, next visit this coming this Fall. Orthopedics: Dr. Berenice Primas for cervical pain.  Since her last visit she received a "shot" and completed PT in 07/2019. Rheumatologist, Dr. Amil Amen.  She follows every 3 months, last visit about 2 weeks ago, she had blood work done. Dermatologist: Dr. Derrel Nip, follows every 1 to 2 years.Appt next month. Dentist, Dr. Gloriann Loan.  Depression screen [facility] 2/9 09/04/2019  Decreased  Interest 0  Down, Depressed, Hopeless 0  PHQ - 2 Score 0    Mini-Cog - 09/04/19 1325    Normal clock drawing test?  yes    How many words correct?  3        Hearing Screening   125Hz 250Hz 500Hz 1000Hz 2000Hz 3000Hz 4000Hz 6000Hz 8000Hz  Right ear:           Left ear:             Visual Acuity Screening   Right eye Left eye Both eyes  Without correction: 20/30 20/20 20/30  With correction:       Last CPE: 08/29/18.  Chronic medical problems: Paroxysmal atrial fibrillation, hypertension, RA, CKD 3, hypothyroidism, osteoporosis, hyperlipidemia, and CKD 3.  Immunization History  Administered Date(s) Administered  . Fluad Quad(high Dose 65+) 04/11/2019  . Influenza, High Dose Seasonal PF 02/24/2018  . PFIZER SARS-COV-2 Vaccination 07/29/2019, 08/21/2019  . Pneumococcal Polysaccharide-23 12/21/2008   Concerns today:  Still complaining of fatigue.  She thinks it is due to B12 deficiency. Last B12 level was in normal range after being off of B12 supplementation for a few months. She doe snot agree with not recommending B12 injections.  Lab Results  Component Value Date   [id number] 04/11/2019   Mild anemia. She has not noted blood in stool,gross hematuria,or melena.  Lab Results  Component Value Date   WBC 7.0 05/25/2019   HGB 11.6 (A) 05/25/2019   HCT 34 (A) 05/25/2019   MCV 108.3 (H) 11/19/2017  PLT 310 05/25/2019   Hypothyroidism: Currently she is on levothyroxine 50 mcg daily.  Lab Results  Component Value Date   TSH 2.95 08/23/2018   Hyperlipidemia: She is on no pharmacologic treatment. She has not tolerated statins in the past.  Lab Results  Component Value Date   CHOL 186 08/23/2018   HDL 100.70 08/23/2018   LDLCALC 72 08/23/2018   TRIG 68.0 08/23/2018   CHOLHDL 2 08/23/2018   Hypertension: She is on Avapro 150 mg daily. BP checked during office visits. History of paroxysmal atrial fibrillation, she has not felt palpitations or CP. She is  not on chronic anticoagulation.She was on Eliquis, discontinued in 12/2017.  Lab Results  Component Value Date   CREATININE 1.1 05/25/2019   BUN 22 (A) 05/25/2019   NA 140 05/25/2019   K 4.4 05/25/2019   CL 104 05/25/2019   CO2 21 05/25/2019    Review of Systems  Constitutional: Negative for appetite change and fever.  HENT: Negative for hearing loss, mouth sores and sore throat.   Eyes: Negative for redness and visual disturbance.  Respiratory: Negative for cough, shortness of breath and wheezing.   Cardiovascular: Negative for leg swelling.  Gastrointestinal: Negative for abdominal pain, nausea and vomiting.       No changes in bowel habits.  Endocrine: Negative for cold intolerance, heat intolerance, polydipsia, polyphagia and polyuria.  Genitourinary: Negative for decreased urine volume, dysuria and hematuria.  Musculoskeletal: Positive for arthralgias and neck pain. Negative for joint swelling.  Skin: Negative for color change and rash.  Neurological: Negative for syncope, weakness and headaches.  Psychiatric/Behavioral: Negative for confusion and sleep disturbance. The patient is not nervous/anxious.   All other systems reviewed and are negative.  Current Outpatient Medications on File Prior to Visit  Medication Sig Dispense Refill  . alendronate (FOSAMAX) 70 MG tablet     . B-D 3CC LUER-LOK SYR 25GX1" 25G X 1" 3 ML MISC USE AS DIRECTED 4 each 0  . calcium carbonate (CALCIUM 600) 600 MG TABS tablet Take 1,200 mg by mouth daily.    . Cholecalciferol (VITAMIN D3) 5000 UNITS TABS Take 10,000 tablets by mouth daily.     . cyanocobalamin (,VITAMIN B-12,) 1000 MCG/ML injection INJECT 1 ML EVERY 3 WEEKS. 4 mL 0  . docusate sodium (COLACE) 100 MG capsule Take 1 capsule (100 mg total) by mouth 2 (two) times daily. 10 capsule 0  . etanercept (ENBREL) 50 MG/ML injection Inject 50 mg into the skin once a week. Thursdays    . folic acid (FOLVITE) 1 MG tablet Take 1 mg by mouth daily.     Marland Kitchen HYDROcodone-acetaminophen (NORCO/VICODIN) 5-325 MG tablet Take 1 tablet by mouth every 6 (six) hours as needed for moderate pain. 30 tablet 0  . methotrexate (RHEUMATREX) 2.5 MG tablet Take 20 mg by mouth every Monday.      No current facility-administered medications on file prior to visit.   Past Medical History:  Diagnosis Date  . Anemia   . Arthritis    Rhuematoid-takes Enbrel and Methorexate   . Back pain    states in Jan 2017 spinal was attempted 4 times and has had back pain since.  . Cancer ([place])    skin cancer on head  . Chicken pox   . Diverticulosis   . History of colon polyps    benign  . Hypertension    takes Avapro daily  . Hypothyroidism    takes Synthroid daily  . Joint pain   .  Pneumonia    at age 26  . PONV (postoperative nausea and vomiting)    the patch helps!  . Urinary frequency   . Urinary urgency    was taking Myrbetriq but stopped.Plans to start back    Past Surgical History:  Procedure Laterality Date  . ACHILLES TENDON SURGERY Left 11/17/2017   Procedure: ACHILLES TENDON REPAIR;  Surgeon: Melrose Nakayama, MD;  Location: [place];  Service: Orthopedics;  Laterality: Left;  . CHOLECYSTECTOMY  2000  . COLONOSCOPY    . EYE SURGERY Left    tear duct surgery  . HAND TENDON SURGERY     Dr. Amedeo Plenty  . KNEE ARTHROSCOPY    . ORIF CALCANEOUS FRACTURE Left 11/17/2017   Procedure: OPEN TREATMENT OF CALCANEOUS FRACTURE;  Surgeon: Melrose Nakayama, MD;  Location: [place];  Service: Orthopedics;  Laterality: Left;  Patient is now an inpatient at [facility] will request that she be moved to [facility] and admitted there for this procedure to be done there.  . ORIF ELBOW FRACTURE Right 02/25/2017   Procedure: OPEN REDUCTION INTERNAL FIXATION (ORIF) RIGHT PROXIMAL OLECRANON FRACTURE;  Surgeon: Iran Planas, MD;  Location: [place];  Service: Orthopedics;  Laterality: Right;  . TOTAL KNEE ARTHROPLASTY Left 06/20/2015  . TOTAL KNEE ARTHROPLASTY Left 06/20/2015   Procedure: TOTAL KNEE  ARTHROPLASTY;  Surgeon: Dorna Leitz, MD;  Location: [place];  Service: Orthopedics;  Laterality: Left;  . TOTAL KNEE ARTHROPLASTY Right 05/28/2016   Procedure: TOTAL KNEE ARTHROPLASTY;  Surgeon: Dorna Leitz, MD;  Location: [place];  Service: Orthopedics;  Laterality: Right;   Allergies  Allergen Reactions  . Oxycodone Other (See Comments)    Makes hallucinates  . Sulfamethoxazole Nausea Only    Family History  Problem Relation Age of Onset  . Cancer Father        colon ca/ lung ca  . Heart disease Father    Social History   Socioeconomic History  . Marital status: Married    Spouse name: Not on file  . Number of children: 2  . Years of education: Not on file  . Highest education level: Not on file  Occupational History  . Occupation: Retired  Tobacco Use  . Smoking status: Former Smoker    Quit date: 06/14/1978    Years since quitting: 41.2  . Smokeless tobacco: Never Used  . Tobacco comment: quit 32 yrs ago  Substance and Sexual Activity  . Alcohol use: Yes    Comment: occ  . Drug use: No  . Sexual activity: Never  Other Topics Concern  . Not on file  Social History Narrative   She lives in [place] with her husband.   Social Determinants of Health   Financial Resource Strain:   . Difficulty of Paying Living Expenses:   Food Insecurity:   . Worried About Charity fundraiser in the Last Year:   . Arboriculturist in the Last Year:   Transportation Needs:   . Film/video editor (Medical):   Marland Kitchen Lack of Transportation (Non-Medical):   Physical Activity:   . Days of Exercise per Week:   . Minutes of Exercise per Session:   Stress:   . Feeling of Stress :   Social Connections:   . Frequency of Communication with Friends and Family:   . Frequency of Social Gatherings with Friends and Family:   . Attends Religious Services:   . Active Member of Clubs or Organizations:   . Attends Archivist Meetings:   .  Marital Status:     Vitals:   09/04/19 1200   BP: 138/70  Pulse: 74  Resp: 16  Temp: (!) 95.4 F (35.2 C)  SpO2: 96%   Body mass index is 26.23 kg/m.  Wt Readings from Last 3 Encounters:  09/04/19 152 lb 12.8 oz (69.3 kg)  08/29/18 154 lb 6 oz (70 kg)  02/24/18 147 lb 8 oz (66.9 kg)   Physical Exam  Nursing note and vitals reviewed. Constitutional: She is oriented to person, place, and time. She appears well-developed. No distress.  HENT:  Head: Normocephalic and atraumatic.  Right Ear: Hearing, tympanic membrane, external ear and ear canal normal.  Left Ear: Hearing, tympanic membrane, external ear and ear canal normal.  Mouth/Throat: Uvula is midline, oropharynx is clear and moist and mucous membranes are normal.  Eyes: Pupils are equal, round, and reactive to light. Conjunctivae and EOM are normal.  Neck: No tracheal deviation present. No thyromegaly present.  Cardiovascular: Normal rate and regular rhythm.  No murmur heard. Pulses:      Dorsalis pedis pulses are 2+ on the right side and 2+ on the left side.  Respiratory: Effort normal and breath sounds normal. No respiratory distress.  GI: Soft. She exhibits no mass. There is no hepatomegaly. There is no abdominal tenderness.  Musculoskeletal:        General: No edema.     Comments: No signs of synovitis appreciated.  Lymphadenopathy:    She has no cervical adenopathy.       Right: No supraclavicular adenopathy present.       Left: No supraclavicular adenopathy present.  Neurological: She is alert and oriented to person, place, and time. She has normal strength. No cranial nerve deficit.  Reflex Scores:      Patellar reflexes are 2+ on the right side and 2+ on the left side. Mildly unstable gait, she has no assistance.  Skin: Skin is warm. No rash noted. No erythema.  Psychiatric: She has a normal mood and affect.  Well groomed, good eye contact.   ASSESSMENT AND PLAN:  Jennifer Goodman was here today annual physical examination.  Orders Placed This  Encounter  Procedures  . Vitamin B12  . TSH    Lab Results  Component Value Date   [id number] 09/04/2019   Lab Results  Component Value Date   TSH 2.54 09/04/2019   Routine general medical examination at a health care facility We discussed the importance of regular physical activity and healthy diet for prevention of chronic illness and/or complications. Preventive guidelines reviewed.  Ca++ and vit D supplementation to continue. Next CPE in a year.  B12 deficiency She is upset because B12 injections were discontinued.  B12 was in normal range despite of not being on treatment for a few months. We will recheck B12 today and give recommendations accordingly.  Hypothyroidism (acquired) Continue levothyroxine 50 mcg daily. Further recommendation will be given according to TSH results.  Medicare annual wellness visit, subsequent We discussed the importance of staying active, physically and mentally, as well as the benefits of a healthy/balance diet. Low impact exercise that involve stretching and strengthing are ideal. Vaccines up-to-date. Fall prevention.  Advance directives and end of life discussed, she has power of attorney and living will.   Paroxysmal atrial fibrillation (HCC) Asymptomatic. Rate and rhythm controlled.  Hypertension, essential, benign BP adequately controlled. No changes in current management. Eye exam is current.  -     irbesartan (AVAPRO) 150 MG tablet;  Take 1 tablet (150 mg total) by mouth daily.  CKD (chronic kidney disease), stage III Renal function has been checked by rheumatologist every 3 months. Continue adequate hydration and low-salt diet. No changes in output.  -     irbesartan (AVAPRO) 150 MG tablet; Take 1 tablet (150 mg total) by mouth daily.    Return in 1 year (on 09/03/2020) for CPE and awv.     G. Martinique, MD  [facility]. [place] office.   Jennifer Goodman , Thank you for taking time to come for your  Medicare Wellness Visit. I appreciate your ongoing commitment to your health goals. Please review the following plan we discussed and let me know if I can assist you in the future.   These are the goals we discussed: Goals   None     This is a list of the screening recommended for you and due dates:  Health Maintenance  Topic Date Due  . Pneumonia vaccines (1 of 2 - PCV13) Never done  . Flu Shot  Completed  . DEXA scan (bone density measurement)  Completed  . Tetanus Vaccine  Discontinued   A few tips:  -As we age balance is not as good as it was, so there is a higher risks for falls. Please remove small rugs and furniture that is "in your way" and could increase the risk of falls. Stretching exercises may help with fall prevention: Yoga and Tai Chi are some examples. Low impact exercise is better, so you are not very achy the next day.  -Sun screen and avoidance of direct sun light recommended. Caution with dehydration, if working outdoors be sure to drink enough fluids.  - Some medications are not safe as we age, increases the risk of side effects and can potentially interact with other medication you are also taken;  including some of over the counter medications. Be sure to let me know when you start a new medication even if it is a dietary/vitamin supplement.   -Healthy diet low in red meet/animal fat and sugar + regular physical activity is recommended.

## 2019-09-04 NOTE — Patient Instructions (Addendum)
  Jennifer Goodman , Thank you for taking time to come for your Medicare Wellness Visit. I appreciate your ongoing commitment to your health goals. Please review the following plan we discussed and let me know if I can assist you in the future.   These are the goals we discussed: Goals   None     This is a list of the screening recommended for you and due dates:  Health Maintenance  Topic Date Due  . Pneumonia vaccines (1 of 2 - PCV13) Never done  . Flu Shot  Completed  . DEXA scan (bone density measurement)  Completed  . Tetanus Vaccine  Discontinued   A few tips:  -As we age balance is not as good as it was, so there is a higher risks for falls. Please remove small rugs and furniture that is "in your way" and could increase the risk of falls. Stretching exercises may help with fall prevention: Yoga and Tai Chi are some examples. Low impact exercise is better, so you are not very achy the next day.  -Sun screen and avoidance of direct sun light recommended. Caution with dehydration, if working outdoors be sure to drink enough fluids.  - Some medications are not safe as we age, increases the risk of side effects and can potentially interact with other medication you are also taken;  including some of over the counter medications. Be sure to let me know when you start a new medication even if it is a dietary/vitamin supplement.   -Healthy diet low in red meet/animal fat and sugar + regular physical activity is recommended.

## 2019-09-06 MED ORDER — LEVOTHYROXINE SODIUM 50 MCG PO TABS: 50.0000 ug | ORAL_TABLET | Freq: Every day | ORAL | 3 refills | Status: DC

## 2019-09-13 DIAGNOSIS — R300 Dysuria: Secondary | ICD-10-CM | POA: Diagnosis not present

## 2019-09-19 ENCOUNTER — Other Ambulatory Visit: Payer: Self-pay

## 2019-09-19 ENCOUNTER — Encounter: Payer: Self-pay | Admitting: Family Medicine

## 2019-09-19 ENCOUNTER — Telehealth: Payer: Self-pay | Admitting: Family Medicine

## 2019-09-19 ENCOUNTER — Ambulatory Visit (INDEPENDENT_AMBULATORY_CARE_PROVIDER_SITE_OTHER): Payer: Medicare Other | Admitting: Family Medicine

## 2019-09-19 VITALS — BP 120/74 | HR 78 | Temp 98.1°F | Resp 16 | Ht 64.0 in | Wt 155.6 lb

## 2019-09-19 DIAGNOSIS — R300 Dysuria: Secondary | ICD-10-CM | POA: Diagnosis not present

## 2019-09-19 DIAGNOSIS — N390 Urinary tract infection, site not specified: Secondary | ICD-10-CM

## 2019-09-19 DIAGNOSIS — R8290 Unspecified abnormal findings in urine: Secondary | ICD-10-CM | POA: Diagnosis not present

## 2019-09-19 DIAGNOSIS — R3129 Other microscopic hematuria: Secondary | ICD-10-CM

## 2019-09-19 DIAGNOSIS — R309 Painful micturition, unspecified: Secondary | ICD-10-CM | POA: Diagnosis not present

## 2019-09-19 LAB — POCT URINALYSIS DIPSTICK
Bilirubin, UA: NEGATIVE
Glucose, UA: NEGATIVE
Ketones, UA: NEGATIVE
Leukocytes, UA: NEGATIVE
Nitrite, UA: POSITIVE
Protein, UA: POSITIVE — AB
Spec Grav, UA: 1.015 (ref 1.010–1.025)
Urobilinogen, UA: 0.2 E.U./dL
pH, UA: 6.5 (ref 5.0–8.0)

## 2019-09-19 MED ORDER — CEPHALEXIN 500 MG PO CAPS: 500.0000 mg | ORAL_CAPSULE | Freq: Two times a day (BID) | ORAL | 0 refills | Status: AC

## 2019-09-19 NOTE — Telephone Encounter (Signed)
Patient scheduled for today

## 2019-09-19 NOTE — Telephone Encounter (Signed)
I will be glad to see her today. Thanks, BJ

## 2019-09-19 NOTE — Telephone Encounter (Signed)
Please advise 

## 2019-09-19 NOTE — Telephone Encounter (Signed)
Pt is having painful urination with a strong odor. Pt has been in pain for a week and believes she has an urinary infection. Pt was seen about 2 weeks ago and doesn't know if she needs to be seen again or if Dr. Martinique, will call something in to the pharmacy? Thanks

## 2019-09-19 NOTE — Patient Instructions (Addendum)
A few things to remember from today's visit:   Painful urination - Plan: POC Urinalysis Dipstick  Abnormal urine odor - Plan: POC Urinalysis Dipstick     Adequate fluid intake, avoid holding urine for long hours, and over the counter Vit C OR cranberry capsules might help.  Today we will treat empirically with antibiotic, which we might need to change when urine culture comes back depending of bacteria susceptibility.  Seek immediate medical attention if severe abdominal pain, vomiting, fever/chills, or worsening symptoms. F/U if symptomatic are not any better after 2-3 days of antibiotic treatment.  Dysuria Dysuria is pain or discomfort while urinating. The pain or discomfort may be felt in the part of your body that drains urine from the bladder (urethra) or in the surrounding tissue of the genitals. The pain may also be felt in the groin area, lower abdomen, or lower back. You may have to urinate frequently or have the sudden feeling that you have to urinate (urgency). Dysuria can affect both men and women, but it is more common in women. Dysuria can be caused by many different things, including:  Urinary tract infection.  Kidney stones or bladder stones.  Certain sexually transmitted infections (STIs), such as chlamydia.  Dehydration.  Inflammation of the tissues of the vagina.  Use of certain medicines.  Use of certain soaps or scented products that cause irritation. Follow these instructions at home: General instructions  Watch your condition for any changes.  Urinate often. Avoid holding urine for long periods of time.  After a bowel movement or urination, women should cleanse from front to back, using each tissue only once.  Urinate after sexual intercourse.  Keep all follow-up visits as told by your health care provider. This is important.  If you had any tests done to find the cause of dysuria, it is up to you to get your test results. Ask your health care  provider, or the department that is doing the test, when your results will be ready. Eating and drinking   Drink enough fluid to keep your urine pale yellow.  Avoid caffeine, tea, and alcohol. They can irritate the bladder and make dysuria worse. In men, alcohol may irritate the prostate. Medicines  Take over-the-counter and prescription medicines only as told by your health care provider.  If you were prescribed an antibiotic medicine, take it as told by your health care provider. Do not stop taking the antibiotic even if you start to feel better. Contact a health care provider if:  You have a fever.  You develop pain in your back or sides.  You have nausea or vomiting.  You have blood in your urine.  You are not urinating as often as you usually do. Get help right away if:  Your pain is severe and not relieved with medicines.  You cannot eat or drink without vomiting.  You are confused.  You have a rapid heartbeat while at rest.  You have shaking or chills.  You feel extremely weak. Summary  Dysuria is pain or discomfort while urinating. Many different conditions can lead to dysuria.  If you have dysuria, you may have to urinate frequently or have the sudden feeling that you have to urinate (urgency).  Watch your condition for any changes. Keep all follow-up visits as told by your health care provider.  Make sure that you urinate often and drink enough fluid to keep your urine pale yellow. This information is not intended to replace advice given to you  by your health care provider. Make sure you discuss any questions you have with your health care provider. Document Revised: 05/13/2017 Document Reviewed: 03/17/2017 Elsevier Patient Education  [place].  Please be sure medication list is accurate. If a new problem present, please set up appointment sooner than planned today.

## 2019-09-19 NOTE — Progress Notes (Signed)
Chief Complaint  Patient presents with  . Painful urination   HPI: [name] is a 85 y.o. female with hx of RA,hypothyroidism,and CKD III who is here today complaining of a weeks of urinary symptoms. Intermittent dysuria and odorous urine. + Increased urinary frequency, voiding every hour. "Bladder spasms."  She has not noted gross hematuria,incontinence, vaginal discharge or bleeding. + "Bubbles " in urine.  Hx of recurrent UTI, last one 03/2019. Not sure about exacerbating factors.  Ucx in 02/2018 was negative, no growth.  She follows with urologist q 6 months.  For a while she took prophylactic abx.  She is not sexually active.  Negative for fever, chills,abdominal pain,N/V,or decreased urine output.  Lab Results  Component Value Date   CREATININE 1.1 05/25/2019   BUN 22 (A) 05/25/2019   NA 140 05/25/2019   K 4.4 05/25/2019   CL 104 05/25/2019   CO2 21 05/25/2019   She has not tired OTC treatments.  Review of Systems  Constitutional: Negative for activity change, appetite change and fatigue.  Respiratory: Negative for shortness of breath.   Cardiovascular: Negative for leg swelling.  Gastrointestinal:       No changes in bowel habits.  Genitourinary: Negative for difficulty urinating and flank pain.  Musculoskeletal: Negative for back pain, gait problem and myalgias.  Skin: Negative for pallor and rash.  Neurological: Negative for syncope and weakness.  Psychiatric/Behavioral: Positive for sleep disturbance. Negative for confusion.  Rest see pertinent positives and negatives per HPI.   Current Outpatient Medications on File Prior to Visit  Medication Sig Dispense Refill  . alendronate (FOSAMAX) 70 MG tablet     . amoxicillin (AMOXIL) 500 MG tablet Take 4 tablet by mouth   BEFORE DENTAL PROCEDURE.    . B-D 3CC LUER-LOK SYR 25GX1" 25G X 1" 3 ML MISC USE AS DIRECTED 4 each 0  . calcium carbonate (OS-CAL) 600 MG TABS tablet Take by mouth.    .  Cholecalciferol (VITAMIN D3) 5000 UNITS TABS Take 10,000 tablets by mouth daily.     . cyanocobalamin (,VITAMIN B-12,) 1000 MCG/ML injection INJECT 1 ML EVERY 3 WEEKS. 4 mL 0  . docusate sodium (COLACE) 100 MG capsule Take 1 capsule (100 mg total) by mouth 2 (two) times daily. 10 capsule 0  . etanercept (ENBREL) 50 MG/ML injection Inject 50 mg into the skin once a week. Thursdays    . folic acid (FOLVITE) 1 MG tablet Take 1 mg by mouth daily.    Marland Kitchen HYDROcodone-acetaminophen (NORCO/VICODIN) 5-325 MG tablet Take 1 tablet by mouth every 6 (six) hours as needed for moderate pain. 30 tablet 0  . irbesartan (AVAPRO) 150 MG tablet Take 1 tablet (150 mg total) by mouth daily. 90 tablet 3  . levothyroxine (SYNTHROID) 50 MCG tablet Take 1 tablet (50 mcg total) by mouth daily before breakfast. 90 tablet 3  . methotrexate (RHEUMATREX) 2.5 MG tablet Take 20 mg by mouth every Monday.      No current facility-administered medications on file prior to visit.     Past Medical History:  Diagnosis Date  . Anemia   . Arthritis    Rhuematoid-takes Enbrel and Methorexate   . Back pain    states in Jan 2017 spinal was attempted 4 times and has had back pain since.  . Cancer ([place])    skin cancer on head  . Chicken pox   . Diverticulosis   . History of colon polyps  benign  . Hypertension    takes Avapro daily  . Hypothyroidism    takes Synthroid daily  . Joint pain   . Pneumonia    at age 19  . PONV (postoperative nausea and vomiting)    the patch helps!  . Urinary frequency   . Urinary urgency    was taking Myrbetriq but stopped.Plans to start back   Allergies  Allergen Reactions  . Oxycodone Other (See Comments)    Makes hallucinates  . Sulfamethoxazole Nausea Only    Social History   Socioeconomic History  . Marital status: Married    Spouse name: Not on file  . Number of children: 2  . Years of education: Not on file  . Highest education level: Not on file  Occupational History   . Occupation: Retired  Tobacco Use  . Smoking status: Former Smoker    Quit date: 06/14/1978    Years since quitting: 41.2  . Smokeless tobacco: Never Used  . Tobacco comment: quit 32 yrs ago  Substance and Sexual Activity  . Alcohol use: Yes    Comment: occ  . Drug use: No  . Sexual activity: Never  Other Topics Concern  . Not on file  Social History Narrative   She lives in [place] with her husband.   Social Determinants of Health   Financial Resource Strain:   . Difficulty of Paying Living Expenses:   Food Insecurity:   . Worried About Charity fundraiser in the Last Year:   . Arboriculturist in the Last Year:   Transportation Needs:   . Film/video editor (Medical):   Marland Kitchen Lack of Transportation (Non-Medical):   Physical Activity:   . Days of Exercise per Week:   . Minutes of Exercise per Session:   Stress:   . Feeling of Stress :   Social Connections:   . Frequency of Communication with Friends and Family:   . Frequency of Social Gatherings with Friends and Family:   . Attends Religious Services:   . Active Member of Clubs or Organizations:   . Attends Archivist Meetings:   Marland Kitchen Marital Status:     Vitals:   09/19/19 1552  BP: 120/74  Pulse: 78  Resp: 16  Temp: 98.1 F (36.7 C)  SpO2: 97%   Body mass index is 26.71 kg/m.   Physical Exam  Nursing note and vitals reviewed. Constitutional: She is oriented to person, place, and time. She appears well-developed. No distress.  HENT:  Head: Normocephalic and atraumatic.  Eyes: Conjunctivae are normal.  Cardiovascular: Normal rate and regular rhythm.  Respiratory: Effort normal and breath sounds normal. No respiratory distress.  GI: Soft. She exhibits no mass. There is no abdominal tenderness. There is no CVA tenderness.  Musculoskeletal:        General: No edema.  Neurological: She is alert and oriented to person, place, and time. She has normal strength.  Stable gait with no assistance.   Skin: Skin is warm. No erythema.  Psychiatric: She has a normal mood and affect.  Well groomed, good eye contact.   ASSESSMENT AND PLAN:  Ms. Jennifer Goodman was seen today for painful urination.  Diagnoses and all orders for this visit:  Dysuria We discussed possible etiologies. Urine dipstick neg for leuk. + Nitrite. Will follow Ucx report.  -     POC Urinalysis Dipstick -     Urine Culture -     Urine Microscopic  Urinary tract infection  without hematuria, site unspecified Empiric abx treatment started today and will be tailored according to Ucx results and susceptibility report. Clearly instructed about warning signs. F/U with urologist if symptoms persist.  -     cephALEXin (KEFLEX) 500 MG capsule; Take 1 capsule (500 mg total) by mouth 2 (two) times daily for 5 days.  Microscopic hematuria Urine dipstick + blood. Further recommendations according to lab results.  -     Urine Culture -     Urine Microscopic   Return if symptoms worsen or fail to improve.   Shacoya Burkhammer G. Martinique, MD  [facility]. [place] office.    A few things to remember from today's visit:   Adequate fluid intake, avoid holding urine for long hours, and over the counter Vit C OR cranberry capsules might help.  Today we will treat empirically with antibiotic, which we might need to change when urine culture comes back depending of bacteria susceptibility.  Seek immediate medical attention if severe abdominal pain, vomiting, fever/chills, or worsening symptoms. F/U if symptomatic are not any better after 2-3 days of antibiotic treatment.  Dysuria Dysuria is pain or discomfort while urinating. The pain or discomfort may be felt in the part of your body that drains urine from the bladder (urethra) or in the surrounding tissue of the genitals. The pain may also be felt in the groin area, lower abdomen, or lower back. You may have to urinate frequently or have the sudden feeling that you have to  urinate (urgency). Dysuria can affect both men and women, but it is more common in women. Dysuria can be caused by many different things, including:  Urinary tract infection.  Kidney stones or bladder stones.  Certain sexually transmitted infections (STIs), such as chlamydia.  Dehydration.  Inflammation of the tissues of the vagina.  Use of certain medicines.  Use of certain soaps or scented products that cause irritation. Follow these instructions at home: General instructions  Watch your condition for any changes.  Urinate often. Avoid holding urine for long periods of time.  After a bowel movement or urination, women should cleanse from front to back, using each tissue only once.  Urinate after sexual intercourse.  Keep all follow-up visits as told by your health care provider. This is important.  If you had any tests done to find the cause of dysuria, it is up to you to get your test results. Ask your health care provider, or the department that is doing the test, when your results will be ready. Eating and drinking   Drink enough fluid to keep your urine pale yellow.  Avoid caffeine, tea, and alcohol. They can irritate the bladder and make dysuria worse. In men, alcohol may irritate the prostate. Medicines  Take over-the-counter and prescription medicines only as told by your health care provider.  If you were prescribed an antibiotic medicine, take it as told by your health care provider. Do not stop taking the antibiotic even if you start to feel better. Contact a health care provider if:  You have a fever.  You develop pain in your back or sides.  You have nausea or vomiting.  You have blood in your urine.  You are not urinating as often as you usually do. Get help right away if:  Your pain is severe and not relieved with medicines.  You cannot eat or drink without vomiting.  You are confused.  You have a rapid heartbeat while at rest.  You have  shaking  or chills.  You feel extremely weak. Summary  Dysuria is pain or discomfort while urinating. Many different conditions can lead to dysuria.  If you have dysuria, you may have to urinate frequently or have the sudden feeling that you have to urinate (urgency).  Watch your condition for any changes. Keep all follow-up visits as told by your health care provider.  Make sure that you urinate often and drink enough fluid to keep your urine pale yellow. This information is not intended to replace advice given to you by your health care provider. Make sure you discuss any questions you have with your health care provider. Document Revised: 05/13/2017 Document Reviewed: 03/17/2017 Elsevier Patient Education  [place].  Please be sure medication list is accurate. If a new problem present, please set up appointment sooner than planned today.

## 2019-09-20 LAB — URINALYSIS, MICROSCOPIC ONLY

## 2019-09-22 LAB — URINE CULTURE
MICRO NUMBER:: 10337148
SPECIMEN QUALITY:: ADEQUATE

## 2019-10-02 DIAGNOSIS — L218 Other seborrheic dermatitis: Secondary | ICD-10-CM | POA: Diagnosis not present

## 2019-10-02 DIAGNOSIS — L570 Actinic keratosis: Secondary | ICD-10-CM | POA: Diagnosis not present

## 2019-10-02 DIAGNOSIS — L438 Other lichen planus: Secondary | ICD-10-CM | POA: Diagnosis not present

## 2019-10-02 DIAGNOSIS — Z85828 Personal history of other malignant neoplasm of skin: Secondary | ICD-10-CM | POA: Diagnosis not present

## 2019-10-02 DIAGNOSIS — L821 Other seborrheic keratosis: Secondary | ICD-10-CM | POA: Diagnosis not present

## 2019-11-27 DIAGNOSIS — Z79899 Other long term (current) drug therapy: Secondary | ICD-10-CM | POA: Diagnosis not present

## 2019-11-27 DIAGNOSIS — G894 Chronic pain syndrome: Secondary | ICD-10-CM | POA: Diagnosis not present

## 2019-11-27 DIAGNOSIS — M0589 Other rheumatoid arthritis with rheumatoid factor of multiple sites: Secondary | ICD-10-CM | POA: Diagnosis not present

## 2019-11-27 DIAGNOSIS — M150 Primary generalized (osteo)arthritis: Secondary | ICD-10-CM | POA: Diagnosis not present

## 2019-11-27 DIAGNOSIS — S86012D Strain of left Achilles tendon, subsequent encounter: Secondary | ICD-10-CM | POA: Diagnosis not present

## 2019-11-28 DIAGNOSIS — M9901 Segmental and somatic dysfunction of cervical region: Secondary | ICD-10-CM | POA: Diagnosis not present

## 2019-11-28 DIAGNOSIS — M5134 Other intervertebral disc degeneration, thoracic region: Secondary | ICD-10-CM | POA: Diagnosis not present

## 2019-11-28 DIAGNOSIS — M9902 Segmental and somatic dysfunction of thoracic region: Secondary | ICD-10-CM | POA: Diagnosis not present

## 2019-11-28 DIAGNOSIS — M5031 Other cervical disc degeneration,  high cervical region: Secondary | ICD-10-CM | POA: Diagnosis not present

## 2019-11-29 DIAGNOSIS — M5134 Other intervertebral disc degeneration, thoracic region: Secondary | ICD-10-CM | POA: Diagnosis not present

## 2019-11-29 DIAGNOSIS — M9902 Segmental and somatic dysfunction of thoracic region: Secondary | ICD-10-CM | POA: Diagnosis not present

## 2019-11-29 DIAGNOSIS — M5031 Other cervical disc degeneration,  high cervical region: Secondary | ICD-10-CM | POA: Diagnosis not present

## 2019-11-29 DIAGNOSIS — M9901 Segmental and somatic dysfunction of cervical region: Secondary | ICD-10-CM | POA: Diagnosis not present

## 2019-12-03 DIAGNOSIS — M9902 Segmental and somatic dysfunction of thoracic region: Secondary | ICD-10-CM | POA: Diagnosis not present

## 2019-12-03 DIAGNOSIS — M9901 Segmental and somatic dysfunction of cervical region: Secondary | ICD-10-CM | POA: Diagnosis not present

## 2019-12-03 DIAGNOSIS — M5031 Other cervical disc degeneration,  high cervical region: Secondary | ICD-10-CM | POA: Diagnosis not present

## 2019-12-03 DIAGNOSIS — M5134 Other intervertebral disc degeneration, thoracic region: Secondary | ICD-10-CM | POA: Diagnosis not present

## 2019-12-06 DIAGNOSIS — M9901 Segmental and somatic dysfunction of cervical region: Secondary | ICD-10-CM | POA: Diagnosis not present

## 2019-12-06 DIAGNOSIS — M5031 Other cervical disc degeneration,  high cervical region: Secondary | ICD-10-CM | POA: Diagnosis not present

## 2019-12-06 DIAGNOSIS — M9902 Segmental and somatic dysfunction of thoracic region: Secondary | ICD-10-CM | POA: Diagnosis not present

## 2019-12-06 DIAGNOSIS — M5134 Other intervertebral disc degeneration, thoracic region: Secondary | ICD-10-CM | POA: Diagnosis not present

## 2019-12-11 DIAGNOSIS — M9902 Segmental and somatic dysfunction of thoracic region: Secondary | ICD-10-CM | POA: Diagnosis not present

## 2019-12-11 DIAGNOSIS — M5134 Other intervertebral disc degeneration, thoracic region: Secondary | ICD-10-CM | POA: Diagnosis not present

## 2019-12-11 DIAGNOSIS — M5031 Other cervical disc degeneration,  high cervical region: Secondary | ICD-10-CM | POA: Diagnosis not present

## 2019-12-11 DIAGNOSIS — M9901 Segmental and somatic dysfunction of cervical region: Secondary | ICD-10-CM | POA: Diagnosis not present

## 2019-12-13 DIAGNOSIS — M9901 Segmental and somatic dysfunction of cervical region: Secondary | ICD-10-CM | POA: Diagnosis not present

## 2019-12-13 DIAGNOSIS — M5031 Other cervical disc degeneration,  high cervical region: Secondary | ICD-10-CM | POA: Diagnosis not present

## 2019-12-13 DIAGNOSIS — M9902 Segmental and somatic dysfunction of thoracic region: Secondary | ICD-10-CM | POA: Diagnosis not present

## 2019-12-13 DIAGNOSIS — M5134 Other intervertebral disc degeneration, thoracic region: Secondary | ICD-10-CM | POA: Diagnosis not present

## 2019-12-20 DIAGNOSIS — M7661 Achilles tendinitis, right leg: Secondary | ICD-10-CM | POA: Diagnosis not present

## 2020-01-07 DIAGNOSIS — M79671 Pain in right foot: Secondary | ICD-10-CM | POA: Diagnosis not present

## 2020-02-20 DIAGNOSIS — R350 Frequency of micturition: Secondary | ICD-10-CM | POA: Diagnosis not present

## 2020-02-29 DIAGNOSIS — G894 Chronic pain syndrome: Secondary | ICD-10-CM | POA: Diagnosis not present

## 2020-02-29 DIAGNOSIS — Z79899 Other long term (current) drug therapy: Secondary | ICD-10-CM | POA: Diagnosis not present

## 2020-02-29 DIAGNOSIS — M150 Primary generalized (osteo)arthritis: Secondary | ICD-10-CM | POA: Diagnosis not present

## 2020-02-29 DIAGNOSIS — S86012D Strain of left Achilles tendon, subsequent encounter: Secondary | ICD-10-CM | POA: Diagnosis not present

## 2020-02-29 DIAGNOSIS — M0589 Other rheumatoid arthritis with rheumatoid factor of multiple sites: Secondary | ICD-10-CM | POA: Diagnosis not present

## 2020-04-09 DIAGNOSIS — D0439 Carcinoma in situ of skin of other parts of face: Secondary | ICD-10-CM | POA: Diagnosis not present

## 2020-04-09 DIAGNOSIS — L821 Other seborrheic keratosis: Secondary | ICD-10-CM | POA: Diagnosis not present

## 2020-04-09 DIAGNOSIS — L820 Inflamed seborrheic keratosis: Secondary | ICD-10-CM | POA: Diagnosis not present

## 2020-04-09 DIAGNOSIS — D0462 Carcinoma in situ of skin of left upper limb, including shoulder: Secondary | ICD-10-CM | POA: Diagnosis not present

## 2020-04-09 DIAGNOSIS — Z85828 Personal history of other malignant neoplasm of skin: Secondary | ICD-10-CM | POA: Diagnosis not present

## 2020-04-09 DIAGNOSIS — L570 Actinic keratosis: Secondary | ICD-10-CM | POA: Diagnosis not present

## 2020-04-09 DIAGNOSIS — L814 Other melanin hyperpigmentation: Secondary | ICD-10-CM | POA: Diagnosis not present

## 2020-04-23 ENCOUNTER — Ambulatory Visit (INDEPENDENT_AMBULATORY_CARE_PROVIDER_SITE_OTHER): Payer: Medicare Other

## 2020-04-23 ENCOUNTER — Other Ambulatory Visit: Payer: Self-pay

## 2020-04-23 DIAGNOSIS — Z23 Encounter for immunization: Secondary | ICD-10-CM | POA: Diagnosis not present

## 2020-04-23 NOTE — Progress Notes (Signed)
Pt came in for her flu vaccine. Vaccine given in the right deltoid, pt tolerated injection well.

## 2020-05-02 DIAGNOSIS — D0439 Carcinoma in situ of skin of other parts of face: Secondary | ICD-10-CM | POA: Diagnosis not present

## 2020-05-02 DIAGNOSIS — D0462 Carcinoma in situ of skin of left upper limb, including shoulder: Secondary | ICD-10-CM | POA: Diagnosis not present

## 2020-05-30 ENCOUNTER — Telehealth: Payer: Self-pay | Admitting: Family Medicine

## 2020-05-30 DIAGNOSIS — E538 Deficiency of other specified B group vitamins: Secondary | ICD-10-CM | POA: Diagnosis not present

## 2020-05-30 DIAGNOSIS — S86012D Strain of left Achilles tendon, subsequent encounter: Secondary | ICD-10-CM | POA: Diagnosis not present

## 2020-05-30 DIAGNOSIS — G894 Chronic pain syndrome: Secondary | ICD-10-CM | POA: Diagnosis not present

## 2020-05-30 DIAGNOSIS — M0589 Other rheumatoid arthritis with rheumatoid factor of multiple sites: Secondary | ICD-10-CM | POA: Diagnosis not present

## 2020-05-30 DIAGNOSIS — M150 Primary generalized (osteo)arthritis: Secondary | ICD-10-CM | POA: Diagnosis not present

## 2020-05-30 DIAGNOSIS — M25561 Pain in right knee: Secondary | ICD-10-CM | POA: Diagnosis not present

## 2020-05-30 NOTE — Progress Notes (Signed)
  Chronic Care Management   Outreach Note  05/30/2020 Name: KATHYANN SPAUGH MRN: [id number] DOB: 1934/10/18  Referred by: Martinique, Betty G, MD Reason for referral : No chief complaint on file.   An unsuccessful telephone outreach was attempted today. The patient was referred to the pharmacist for assistance with care management and care coordination.   Follow Up Plan:   Carley Perdue [employer] Scheduler

## 2020-06-12 DIAGNOSIS — H52201 Unspecified astigmatism, right eye: Secondary | ICD-10-CM | POA: Diagnosis not present

## 2020-06-12 DIAGNOSIS — H524 Presbyopia: Secondary | ICD-10-CM | POA: Diagnosis not present

## 2020-06-12 DIAGNOSIS — H5203 Hypermetropia, bilateral: Secondary | ICD-10-CM | POA: Diagnosis not present

## 2020-06-12 DIAGNOSIS — H26492 Other secondary cataract, left eye: Secondary | ICD-10-CM | POA: Diagnosis not present

## 2020-06-18 ENCOUNTER — Telehealth: Payer: Self-pay | Admitting: Family Medicine

## 2020-06-18 NOTE — Progress Notes (Signed)
  Chronic Care Management   Outreach Note  06/18/2020 Name: ARIETTA EISENSTEIN MRN: [id number] DOB: 1934/11/16  Referred by: [place], Betty G, MD Reason for referral : No chief complaint on file.   A second unsuccessful telephone outreach was attempted today. The patient was referred to pharmacist for assistance with care management and care coordination.  Follow Up Plan:   Carley Perdue [employer] Scheduler

## 2020-07-09 ENCOUNTER — Encounter: Payer: Self-pay | Admitting: Family Medicine

## 2020-08-28 DIAGNOSIS — Z79899 Other long term (current) drug therapy: Secondary | ICD-10-CM | POA: Diagnosis not present

## 2020-08-28 DIAGNOSIS — G894 Chronic pain syndrome: Secondary | ICD-10-CM | POA: Diagnosis not present

## 2020-08-28 DIAGNOSIS — M150 Primary generalized (osteo)arthritis: Secondary | ICD-10-CM | POA: Diagnosis not present

## 2020-08-28 DIAGNOSIS — S86012D Strain of left Achilles tendon, subsequent encounter: Secondary | ICD-10-CM | POA: Diagnosis not present

## 2020-08-28 DIAGNOSIS — M0589 Other rheumatoid arthritis with rheumatoid factor of multiple sites: Secondary | ICD-10-CM | POA: Diagnosis not present

## 2020-08-28 DIAGNOSIS — M25561 Pain in right knee: Secondary | ICD-10-CM | POA: Diagnosis not present

## 2020-08-28 DIAGNOSIS — E538 Deficiency of other specified B group vitamins: Secondary | ICD-10-CM | POA: Diagnosis not present

## 2020-08-28 DIAGNOSIS — M47812 Spondylosis without myelopathy or radiculopathy, cervical region: Secondary | ICD-10-CM | POA: Diagnosis not present

## 2020-08-28 DIAGNOSIS — M25432 Effusion, left wrist: Secondary | ICD-10-CM | POA: Diagnosis not present

## 2020-09-05 ENCOUNTER — Encounter: Payer: Medicare Other | Admitting: Family Medicine

## 2020-09-09 ENCOUNTER — Telehealth: Payer: Self-pay | Admitting: Family Medicine

## 2020-09-09 DIAGNOSIS — E7800 Pure hypercholesterolemia, unspecified: Secondary | ICD-10-CM

## 2020-09-09 DIAGNOSIS — N1831 Chronic kidney disease, stage 3a: Secondary | ICD-10-CM

## 2020-09-09 DIAGNOSIS — E039 Hypothyroidism, unspecified: Secondary | ICD-10-CM

## 2020-09-09 DIAGNOSIS — I10 Essential (primary) hypertension: Secondary | ICD-10-CM

## 2020-09-09 DIAGNOSIS — E538 Deficiency of other specified B group vitamins: Secondary | ICD-10-CM

## 2020-09-09 NOTE — Telephone Encounter (Signed)
Okay for labs before cpe?

## 2020-09-09 NOTE — Telephone Encounter (Signed)
Patient is calling and has a physical scheduled for 4/1 and wanted to see if she can get her labs done prior to appointment to discuss at visit, please advise. CB is [phone].

## 2020-09-10 NOTE — Telephone Encounter (Signed)
Okay to schedule lab appt, needs to be fasting.

## 2020-09-10 NOTE — Telephone Encounter (Signed)
LMVM for the patient to contact the office to schedule for labs before her appointment on 09/12/2020.

## 2020-09-10 NOTE — Telephone Encounter (Signed)
Patient returned call and I have her scheduled for labs on Friday 04/01 and her CPE with Dr. Martinique on 04/11.  Nothing further needed

## 2020-09-12 ENCOUNTER — Other Ambulatory Visit: Payer: Self-pay

## 2020-09-12 ENCOUNTER — Encounter: Payer: Medicare Other | Admitting: Family Medicine

## 2020-09-12 ENCOUNTER — Other Ambulatory Visit (INDEPENDENT_AMBULATORY_CARE_PROVIDER_SITE_OTHER): Payer: Medicare Other

## 2020-09-12 DIAGNOSIS — E039 Hypothyroidism, unspecified: Secondary | ICD-10-CM

## 2020-09-12 DIAGNOSIS — E7800 Pure hypercholesterolemia, unspecified: Secondary | ICD-10-CM

## 2020-09-12 DIAGNOSIS — I10 Essential (primary) hypertension: Secondary | ICD-10-CM | POA: Diagnosis not present

## 2020-09-12 DIAGNOSIS — N1831 Chronic kidney disease, stage 3a: Secondary | ICD-10-CM

## 2020-09-12 DIAGNOSIS — E538 Deficiency of other specified B group vitamins: Secondary | ICD-10-CM

## 2020-09-12 LAB — CBC WITH DIFFERENTIAL/PLATELET
Basophils Absolute: 0 10*3/uL (ref 0.0–0.1)
Basophils Relative: 0.6 % (ref 0.0–3.0)
Eosinophils Absolute: 0.3 10*3/uL (ref 0.0–0.7)
Eosinophils Relative: 3.7 % (ref 0.0–5.0)
HCT: 35.8 % — ABNORMAL LOW (ref 36.0–46.0)
Hemoglobin: 11.6 g/dL — ABNORMAL LOW (ref 12.0–15.0)
Lymphocytes Relative: 42 % (ref 12.0–46.0)
Lymphs Abs: 2.9 10*3/uL (ref 0.7–4.0)
MCHC: 32.4 g/dL (ref 30.0–36.0)
MCV: 95.6 fl (ref 78.0–100.0)
Monocytes Absolute: 0.6 10*3/uL (ref 0.1–1.0)
Monocytes Relative: 8.3 % (ref 3.0–12.0)
Neutro Abs: 3.1 10*3/uL (ref 1.4–7.7)
Neutrophils Relative %: 45.4 % (ref 43.0–77.0)
Platelets: 247 10*3/uL (ref 150.0–400.0)
RBC: 3.74 Mil/uL — ABNORMAL LOW (ref 3.87–5.11)
RDW: 17.6 % — ABNORMAL HIGH (ref 11.5–15.5)
WBC: 6.8 10*3/uL (ref 4.0–10.5)

## 2020-09-12 LAB — TSH: TSH: 2.77 u[IU]/mL (ref 0.35–4.50)

## 2020-09-12 LAB — BASIC METABOLIC PANEL
BUN: 21 mg/dL (ref 6–23)
CO2: 27 mEq/L (ref 19–32)
Calcium: 10.1 mg/dL (ref 8.4–10.5)
Chloride: 104 mEq/L (ref 96–112)
Creatinine, Ser: 0.98 mg/dL (ref 0.40–1.20)
GFR: 53.39 mL/min — ABNORMAL LOW (ref >60.00)
Glucose, Bld: 99 mg/dL (ref 70–99)
Potassium: 4.5 mEq/L (ref 3.5–5.1)
Sodium: 140 mEq/L (ref 135–145)

## 2020-09-12 LAB — LIPID PANEL
Cholesterol: 204 mg/dL — ABNORMAL HIGH (ref 0–200)
HDL: 106.4 mg/dL (ref >39.00)
LDL Cholesterol: 85 mg/dL (ref 0–99)
NonHDL: 97.58
Total CHOL/HDL Ratio: 2
Triglycerides: 64 mg/dL (ref 0.0–149.0)
VLDL: 12.8 mg/dL (ref 0.0–40.0)

## 2020-09-12 LAB — VITAMIN B12: Vitamin B-12: 279 pg/mL (ref 211–911)

## 2020-09-15 ENCOUNTER — Other Ambulatory Visit: Payer: Self-pay | Admitting: Family Medicine

## 2020-09-15 DIAGNOSIS — E039 Hypothyroidism, unspecified: Secondary | ICD-10-CM

## 2020-09-19 ENCOUNTER — Other Ambulatory Visit: Payer: Self-pay

## 2020-09-22 ENCOUNTER — Other Ambulatory Visit: Payer: Self-pay

## 2020-09-22 ENCOUNTER — Encounter: Payer: Self-pay | Admitting: Family Medicine

## 2020-09-22 ENCOUNTER — Ambulatory Visit (INDEPENDENT_AMBULATORY_CARE_PROVIDER_SITE_OTHER): Payer: Medicare Other | Admitting: Family Medicine

## 2020-09-22 VITALS — BP 130/70 | HR 88 | Temp 98.0°F | Resp 16 | Ht 64.0 in | Wt 147.1 lb

## 2020-09-22 DIAGNOSIS — E039 Hypothyroidism, unspecified: Secondary | ICD-10-CM

## 2020-09-22 DIAGNOSIS — I480 Paroxysmal atrial fibrillation: Secondary | ICD-10-CM | POA: Diagnosis not present

## 2020-09-22 DIAGNOSIS — N1831 Chronic kidney disease, stage 3a: Secondary | ICD-10-CM | POA: Diagnosis not present

## 2020-09-22 DIAGNOSIS — I10 Essential (primary) hypertension: Secondary | ICD-10-CM

## 2020-09-22 DIAGNOSIS — Z0000 Encounter for general adult medical examination without abnormal findings: Secondary | ICD-10-CM | POA: Diagnosis not present

## 2020-09-22 DIAGNOSIS — E538 Deficiency of other specified B group vitamins: Secondary | ICD-10-CM

## 2020-09-22 DIAGNOSIS — M8000XA Age-related osteoporosis with current pathological fracture, unspecified site, initial encounter for fracture: Secondary | ICD-10-CM

## 2020-09-22 MED ORDER — IRBESARTAN 150 MG PO TABS: 150.0000 mg | ORAL_TABLET | Freq: Every day | ORAL | 3 refills | Status: DC

## 2020-09-22 MED ORDER — CYANOCOBALAMIN 1000 MCG/ML IJ SOLN
1000.0000 ug | Freq: Once | INTRAMUSCULAR | Status: AC
  Administered 2020-09-22: 1000 ug via INTRAMUSCULAR

## 2020-09-22 NOTE — Progress Notes (Signed)
HPI: Jennifer Goodman is a 86 y.o. female, who is here today for her routine physical.  Last CPE: 09/04/19.  Regular exercise 3 or more time per week: Walking 1/2 mile daily. Following a healthy diet: Yes, she cooks most of the time. She orders take out during weekends. She lives with her husband.  Chronic medical problems: B12 deficiency, generalized OA,back pain, RA,hypothyroidism,recurrent UTI's,paroxysmal atrial fib,CKD III,a nd HTN among some. She follows with urologist regularly, on daily Nitrofurantoin 100 mg.  Immunization History  Administered Date(s) Administered  . Fluad Quad(high Dose 65+) 04/11/2019, 04/23/2020  . Influenza, High Dose Seasonal PF 02/24/2018  . PFIZER(Purple Top)SARS-COV-2 Vaccination 07/29/2019, 08/21/2019  . Pneumococcal Polysaccharide-23 12/21/2008   Mammogram: 04/2016. DEXA: Osteopenia Major Osteoporotic Fracture: 17.2% Hip Fracture: 4.6% She stopped Fosamax a few months ago, afraid of affecting her GERD.  She had blood work recently. HLD:She is on non pharmacologic treatment.  Lab Results  Component Value Date   CHOL 204 (H) 09/12/2020   HDL 106.40 09/12/2020   LDLCALC 85 09/12/2020   TRIG 64.0 09/12/2020   CHOLHDL 2 09/12/2020   She is not longer on B12 injections. Lab Results  Component Value Date   VITAMINB12 279 09/12/2020   HTN: Dx'ed in 2017. She is on Irbesartan 150 mg daily. Takes BP occasionally. Paroxsymal atrial fib, took Eliquis until 12/2017. Negative for palpitations,SOB,or diaphoresis.  CKD III: She has not noted gross hematuria, foam in urine,or decreased urine output.  Lab Results  Component Value Date   CREATININE 0.98 09/12/2020   BUN 21 09/12/2020   NA 140 09/12/2020   K 4.5 09/12/2020   CL 104 09/12/2020   CO2 27 09/12/2020   RA on Methotrexate and Embrel. Follows with rheumatologist q 3-4 months.  Hypothyroidism on Synthroid 50 mcg daily. Dx'ed in 2010. Lab Results  Component Value Date   TSH  2.77 09/12/2020   Review of Systems  Constitutional: Positive for fatigue. Negative for appetite change and fever.  HENT: Negative for mouth sores and sore throat.   Eyes: Negative for redness and visual disturbance.  Respiratory: Negative for cough and wheezing.   Cardiovascular: Negative for chest pain and leg swelling.  Gastrointestinal: Negative for abdominal pain, nausea and vomiting.       No changes in bowel habits.  Endocrine: Negative for cold intolerance, heat intolerance, polydipsia, polyphagia and polyuria.  Genitourinary: Negative for decreased urine volume, dysuria and hematuria.  Musculoskeletal: Positive for arthralgias, back pain and gait problem. Negative for myalgias.  Skin: Negative for color change and rash.  Allergic/Immunologic: Negative for environmental allergies.  Neurological: Negative for syncope, weakness and headaches.  Psychiatric/Behavioral: Negative for behavioral problems and confusion.  All other systems reviewed and are negative.  Current Outpatient Medications on File Prior to Visit  Medication Sig Dispense Refill  . alendronate (FOSAMAX) 70 MG tablet     . amoxicillin (AMOXIL) 500 MG tablet Take 4 tablet by mouth   BEFORE DENTAL PROCEDURE.    . B-D 3CC LUER-LOK SYR 25GX1" 25G X 1" 3 ML MISC USE AS DIRECTED 4 each 0  . calcium carbonate (OS-CAL) 600 MG TABS tablet Take by mouth.    . Cholecalciferol (VITAMIN D3) 5000 UNITS TABS Take 10,000 tablets by mouth daily.     . cyanocobalamin (,VITAMIN B-12,) 1000 MCG/ML injection INJECT 1 ML EVERY 3 WEEKS. 4 mL 0  . docusate sodium (COLACE) 100 MG capsule Take 1 capsule (100 mg total) by mouth 2 (two) times daily. 10  capsule 0  . etanercept (ENBREL) 50 MG/ML injection Inject 50 mg into the skin once a week. Thursdays    . folic acid (FOLVITE) 1 MG tablet Take 1 mg by mouth daily.    Marland Kitchen levothyroxine (SYNTHROID) 50 MCG tablet TAKE 1 TABLET IN THE MORNING ON AN EMPTY STOMACH. 90 tablet 3  . methotrexate  (RHEUMATREX) 2.5 MG tablet Take 20 mg by mouth every Monday.     . nitrofurantoin (MACRODANTIN) 100 MG capsule Take 100 mg by mouth daily. Urologist.     No current facility-administered medications on file prior to visit.   Past Medical History:  Diagnosis Date  . Anemia   . Arthritis    Rhuematoid-takes Enbrel and Methorexate   . Back pain    states in Jan 2017 spinal was attempted 4 times and has had back pain since.  . Cancer ([place])    skin cancer on head  . Chicken pox   . Diverticulosis   . History of colon polyps    benign  . Hypertension    takes Avapro daily  . Hypothyroidism    takes Synthroid daily  . Joint pain   . Pneumonia    at age 5  . PONV (postoperative nausea and vomiting)    the patch helps!  . Urinary frequency   . Urinary urgency    was taking Myrbetriq but stopped.Plans to start back    Past Surgical History:  Procedure Laterality Date  . ACHILLES TENDON SURGERY Left 11/17/2017   Procedure: ACHILLES TENDON REPAIR;  Surgeon: Melrose Nakayama, MD;  Location: [place];  Service: Orthopedics;  Laterality: Left;  . CHOLECYSTECTOMY  2000  . COLONOSCOPY    . EYE SURGERY Left    tear duct surgery  . HAND TENDON SURGERY     Dr. Amedeo Plenty  . KNEE ARTHROSCOPY    . ORIF CALCANEOUS FRACTURE Left 11/17/2017   Procedure: OPEN TREATMENT OF CALCANEOUS FRACTURE;  Surgeon: Melrose Nakayama, MD;  Location: [place];  Service: Orthopedics;  Laterality: Left;  Patient is now an inpatient at [facility] will request that she be moved to [facility] and admitted there for this procedure to be done there.  . ORIF ELBOW FRACTURE Right 02/25/2017   Procedure: OPEN REDUCTION INTERNAL FIXATION (ORIF) RIGHT PROXIMAL OLECRANON FRACTURE;  Surgeon: Iran Planas, MD;  Location: [place];  Service: Orthopedics;  Laterality: Right;  . TOTAL KNEE ARTHROPLASTY Left 06/20/2015  . TOTAL KNEE ARTHROPLASTY Left 06/20/2015   Procedure: TOTAL KNEE ARTHROPLASTY;  Surgeon: Dorna Leitz, MD;  Location: [place];  Service:  Orthopedics;  Laterality: Left;  . TOTAL KNEE ARTHROPLASTY Right 05/28/2016   Procedure: TOTAL KNEE ARTHROPLASTY;  Surgeon: Dorna Leitz, MD;  Location: [place];  Service: Orthopedics;  Laterality: Right;    Allergies  Allergen Reactions  . Oxycodone Other (See Comments)    Makes hallucinates  . Sulfamethoxazole Nausea Only    Family History  Problem Relation Age of Onset  . Cancer Father        colon ca/ lung ca  . Heart disease Father     Social History   Socioeconomic History  . Marital status: Married    Spouse name: Not on file  . Number of children: 2  . Years of education: Not on file  . Highest education level: Not on file  Occupational History  . Occupation: Retired  Tobacco Use  . Smoking status: Former Smoker    Quit date: 06/14/1978    Years since quitting: 42.3  .  Smokeless tobacco: Never Used  . Tobacco comment: quit 32 yrs ago  Vaping Use  . Vaping Use: Never used  Substance and Sexual Activity  . Alcohol use: Yes    Comment: occ  . Drug use: No  . Sexual activity: Never  Other Topics Concern  . Not on file  Social History Narrative   She lives in [place] with her husband.   Social Determinants of Health   Financial Resource Strain: Not on file  Food Insecurity: Not on file  Transportation Needs: Not on file  Physical Activity: Not on file  Stress: Not on file  Social Connections: Not on file   Vitals:   09/22/20 1123  BP: 130/70  Pulse: 88  Resp: 16  Temp: 98 F (36.7 C)  SpO2: 96%   Body mass index is 25.25 kg/m.  Wt Readings from Last 3 Encounters:  09/22/20 147 lb 2 oz (66.7 kg)  09/19/19 155 lb 9.6 oz (70.6 kg)  09/04/19 152 lb 12.8 oz (69.3 kg)   Physical Exam Vitals and nursing note reviewed.  Constitutional:      General: She is not in acute distress.    Appearance: She is well-developed.  HENT:     Head: Normocephalic and atraumatic.     Right Ear: Hearing, tympanic membrane, ear canal and external ear normal.      Left Ear: Hearing, tympanic membrane, ear canal and external ear normal.     Mouth/Throat:     Mouth: Mucous membranes are moist.     Pharynx: Uvula midline.  Eyes:     Conjunctiva/sclera: Conjunctivae normal.     Pupils: Pupils are equal, round, and reactive to light.  Neck:     Thyroid: No thyromegaly.     Trachea: No tracheal deviation.  Cardiovascular:     Rate and Rhythm: Normal rate and regular rhythm.     Pulses:          Dorsalis pedis pulses are 2+ on the right side and 2+ on the left side.     Heart sounds: No murmur heard.   Pulmonary:     Effort: Pulmonary effort is normal. No respiratory distress.     Breath sounds: Normal breath sounds.  Chest:  Breasts:     Right: No supraclavicular adenopathy.     Left: No supraclavicular adenopathy.    Abdominal:     Palpations: Abdomen is soft. There is no hepatomegaly or mass.     Tenderness: There is no abdominal tenderness.  Genitourinary:    Comments: Deferred to gyn. Lymphadenopathy:     Cervical: No cervical adenopathy.     Upper Body:     Right upper body: No supraclavicular adenopathy.     Left upper body: No supraclavicular adenopathy.  Skin:    General: Skin is warm.     Findings: No erythema or rash.  Neurological:     Mental Status: She is alert and oriented to person, place, and time.     Cranial Nerves: No cranial nerve deficit.     Deep Tendon Reflexes:     Reflex Scores:      Patellar reflexes are 2+ on the right side and 2+ on the left side.    Comments: Antalgic gait, not assisted.  Psychiatric:        Speech: Speech normal.     Comments: Well groomed, good eye contact.   ASSESSMENT AND PLAN:  Ms. KRYSTYNE Goodman was here today annual physical examination.  Diagnoses  and all orders for this visit:  Routine general medical examination at a health care facility We discussed the importance of regular low impact physical activity and healthy diet for prevention of chronic illness and/or  complications. Preventive guidelines reviewed. Vaccination up to date.  Ca++ and vit D supplementation recommended. Next CPE in a year.  Hypothyroidism (acquired) Problem is well controlled. No changes in Synthroid dose.  Stage 3a chronic kidney disease (HCC) Stable, Cr 1.9-1.1 and e GFR low 50's. Continue Irbesartan. Low salt diet ,adequate hydration,and continue avoiding NSAID's. She has CMP done at her rheumatologist's office regularly.  -     irbesartan (AVAPRO) 150 MG tablet; Take 1 tablet (150 mg total) by mouth daily.  Hypertension, essential, benign BP adequately controlled. No changes in current management.  -     irbesartan (AVAPRO) 150 MG tablet; Take 1 tablet (150 mg total) by mouth daily.  B12 deficiency Today after verbal consent she received B12 1000 mcg IM x 1. She will continue B12 1000 mcg q 5-6 weeks. B12 in 3-4 months.  -     cyanocobalamin ((VITAMIN B-12)) injection 1,000 mcg  Age-related osteoporosis with current pathological fracture, initial encounter Continue Ca++ and vit D supplementation. Some side effects of fosamax as well as benefits. She agrees with resuming medication. Fall prevention. Following with rheumatologist.  Paroxysmal atrial fibrillation [facility]) Not longer on anticoagulation. Rhythm and rate controlled.  Return in 1 year (on 09/22/2021) for 5-6 weeks for B12, lab in 3-4 months, and needs AWV.   Meya Clutter G. Martinique, MD  [facility]. [place] office.   A few things to remember from today's visit:   Routine general medical examination at a health care facility  Hypothyroidism (acquired)  Stage 3a chronic kidney disease ([place]) - Plan: irbesartan (AVAPRO) 150 MG tablet  Hypertension, essential, benign - Plan: irbesartan (AVAPRO) 150 MG tablet  B12 deficiency  Age-related osteoporosis with current pathological fracture, initial encounter  If you need refills please call your pharmacy. Do not use My Chart to  request refills or for acute issues that need immediate attention.   B12 1000 mcg today and every 5-6 weeks. We will re-check B12 in 3-4 months. Resume Fosamax. No changes in rest of your medications.  Please arrange lab appt.  Please be sure medication list is accurate. If a new problem present, please set up appointment sooner than planned today.

## 2020-09-22 NOTE — Patient Instructions (Addendum)
A few things to remember from today's visit:   Routine general medical examination at a health care facility  Hypothyroidism (acquired)  Stage 3a chronic kidney disease ([place]) - Plan: irbesartan (AVAPRO) 150 MG tablet  Hypertension, essential, benign - Plan: irbesartan (AVAPRO) 150 MG tablet  B12 deficiency  Age-related osteoporosis with current pathological fracture, initial encounter  If you need refills please call your pharmacy. Do not use My Chart to request refills or for acute issues that need immediate attention.   B12 1000 mcg today and every 5-6 weeks. We will re-check B12 in 3-4 months. Resume Fosamax. No changes in rest of your medications.  Please arrange lab appt.  Please be sure medication list is accurate. If a new problem present, please set up appointment sooner than planned today.

## 2020-10-08 DIAGNOSIS — D0462 Carcinoma in situ of skin of left upper limb, including shoulder: Secondary | ICD-10-CM | POA: Diagnosis not present

## 2020-10-08 DIAGNOSIS — L905 Scar conditions and fibrosis of skin: Secondary | ICD-10-CM | POA: Diagnosis not present

## 2020-10-08 DIAGNOSIS — Z85828 Personal history of other malignant neoplasm of skin: Secondary | ICD-10-CM | POA: Diagnosis not present

## 2020-10-08 DIAGNOSIS — D485 Neoplasm of uncertain behavior of skin: Secondary | ICD-10-CM | POA: Diagnosis not present

## 2020-10-08 DIAGNOSIS — D1801 Hemangioma of skin and subcutaneous tissue: Secondary | ICD-10-CM | POA: Diagnosis not present

## 2020-10-08 DIAGNOSIS — D225 Melanocytic nevi of trunk: Secondary | ICD-10-CM | POA: Diagnosis not present

## 2020-10-08 DIAGNOSIS — L814 Other melanin hyperpigmentation: Secondary | ICD-10-CM | POA: Diagnosis not present

## 2020-10-08 DIAGNOSIS — L821 Other seborrheic keratosis: Secondary | ICD-10-CM | POA: Diagnosis not present

## 2020-10-08 DIAGNOSIS — L570 Actinic keratosis: Secondary | ICD-10-CM | POA: Diagnosis not present

## 2020-10-22 DIAGNOSIS — D0462 Carcinoma in situ of skin of left upper limb, including shoulder: Secondary | ICD-10-CM | POA: Diagnosis not present

## 2020-10-27 ENCOUNTER — Other Ambulatory Visit: Payer: Self-pay

## 2020-10-27 ENCOUNTER — Ambulatory Visit (INDEPENDENT_AMBULATORY_CARE_PROVIDER_SITE_OTHER): Payer: Medicare Other

## 2020-10-27 DIAGNOSIS — E538 Deficiency of other specified B group vitamins: Secondary | ICD-10-CM

## 2020-10-27 MED ORDER — CYANOCOBALAMIN 1000 MCG/ML IJ SOLN
1000.0000 ug | Freq: Once | INTRAMUSCULAR | Status: AC
  Administered 2020-10-27: 1000 ug via INTRAMUSCULAR

## 2020-10-27 NOTE — Progress Notes (Signed)
Per orders of Dr. Volanda Napoleon, injection of B12 given by Rodrigo Ran. Patient tolerated injection well.

## 2020-10-30 ENCOUNTER — Telehealth: Payer: Self-pay | Admitting: Family Medicine

## 2020-10-30 NOTE — Telephone Encounter (Signed)
Left message for patient to call back and schedule Medicare Annual Wellness Visit (AWV) either virtually or in office.   Last AWV 09/04/19  please schedule at anytime with [facility] Nurse Health Advisor 1 or 2   This should be a 45 minute visit.  

## 2020-11-12 ENCOUNTER — Telehealth: Payer: Self-pay | Admitting: Family Medicine

## 2020-11-12 NOTE — Chronic Care Management (AMB) (Signed)
  Chronic Care Management   Note  11/12/2020 Name: ASHLEYANNE HEMMINGWAY MRN: [id number] DOB: 07-17-1934  TANAIRI CYPERT is a 86 y.o. year old female who is a primary care patient of Martinique, Malka So, MD. I reached out to Arelia Sneddon by phone today in response to a referral sent by Ms. Willia Craze Cariker's PCP, Martinique, Betty G, MD.   Ms. Roh was given information about Chronic Care Management services today including:  1. CCM service includes personalized support from designated clinical staff supervised by her physician, including individualized plan of care and coordination with other care providers 2. 24/7 contact phone numbers for assistance for urgent and routine care needs. 3. Service will only be billed when office clinical staff spend 20 minutes or more in a month to coordinate care. 4. Only one practitioner may furnish and bill the service in a calendar month. 5. The patient may stop CCM services at any time (effective at the end of the month) by phone call to the office staff.   Patient agreed to services and verbal consent obtained.   Follow up plan:   Tatjana Secretary/administrator

## 2020-11-27 ENCOUNTER — Ambulatory Visit (INDEPENDENT_AMBULATORY_CARE_PROVIDER_SITE_OTHER): Payer: Medicare Other

## 2020-11-27 ENCOUNTER — Telehealth: Payer: Self-pay | Admitting: Family Medicine

## 2020-11-27 ENCOUNTER — Other Ambulatory Visit: Payer: Self-pay

## 2020-11-27 DIAGNOSIS — E538 Deficiency of other specified B group vitamins: Secondary | ICD-10-CM

## 2020-11-27 MED ORDER — CYANOCOBALAMIN 1000 MCG/ML IJ SOLN
1000.0000 ug | Freq: Once | INTRAMUSCULAR | Status: AC
  Administered 2020-11-27: 1000 ug via INTRAMUSCULAR

## 2020-11-27 NOTE — Telephone Encounter (Signed)
Last office appointment- 09/22/20.  Please advise

## 2020-11-27 NOTE — Telephone Encounter (Signed)
Patient got a B12 injection today.  She is wanting a call back to let her know how often she is supposed to come and to schedule her next injection.

## 2020-11-27 NOTE — Progress Notes (Signed)
Per orders  from Dr. Martinique, patient was given a B12 injection by Madaline Guthrie, tolerated well.

## 2020-11-28 DIAGNOSIS — Z79899 Other long term (current) drug therapy: Secondary | ICD-10-CM | POA: Diagnosis not present

## 2020-11-28 DIAGNOSIS — M810 Age-related osteoporosis without current pathological fracture: Secondary | ICD-10-CM | POA: Diagnosis not present

## 2020-11-28 DIAGNOSIS — M25512 Pain in left shoulder: Secondary | ICD-10-CM | POA: Diagnosis not present

## 2020-11-28 DIAGNOSIS — S86012D Strain of left Achilles tendon, subsequent encounter: Secondary | ICD-10-CM | POA: Diagnosis not present

## 2020-11-28 DIAGNOSIS — E538 Deficiency of other specified B group vitamins: Secondary | ICD-10-CM | POA: Diagnosis not present

## 2020-11-28 DIAGNOSIS — G894 Chronic pain syndrome: Secondary | ICD-10-CM | POA: Diagnosis not present

## 2020-11-28 DIAGNOSIS — M0589 Other rheumatoid arthritis with rheumatoid factor of multiple sites: Secondary | ICD-10-CM | POA: Diagnosis not present

## 2020-11-28 DIAGNOSIS — M47812 Spondylosis without myelopathy or radiculopathy, cervical region: Secondary | ICD-10-CM | POA: Diagnosis not present

## 2020-11-28 DIAGNOSIS — M150 Primary generalized (osteo)arthritis: Secondary | ICD-10-CM | POA: Diagnosis not present

## 2020-11-28 DIAGNOSIS — M25561 Pain in right knee: Secondary | ICD-10-CM | POA: Diagnosis not present

## 2020-11-28 DIAGNOSIS — M25432 Effusion, left wrist: Secondary | ICD-10-CM | POA: Diagnosis not present

## 2020-11-28 DIAGNOSIS — M722 Plantar fascial fibromatosis: Secondary | ICD-10-CM | POA: Diagnosis not present

## 2020-12-01 NOTE — Telephone Encounter (Signed)
I tried contacting pt, but unable to leave a voicemail.  B12 injections every 5-6 weeks, will recheck level in August.

## 2020-12-01 NOTE — Telephone Encounter (Signed)
I left pt a message to return my call.  B12 injections every 5-6 weeks, recheck lab in August.  Next B12 is due 7/21, and we will check her B12 level in August.

## 2020-12-01 NOTE — Telephone Encounter (Signed)
I spoke with patient. Nurse visit scheduled for 7/21 at 2:30 for B12 injection. Lab visit scheduled for 8/12 at 10 for B12 lab draw.

## 2020-12-01 NOTE — Addendum Note (Signed)
Addended by: Rodrigo Ran on: 12/01/2020 03:04 PM   Modules accepted: Orders

## 2020-12-17 ENCOUNTER — Telehealth: Payer: Self-pay | Admitting: Family Medicine

## 2020-12-17 NOTE — Telephone Encounter (Signed)
Pt returned the call to the office and will call back at a later time to schedule

## 2020-12-17 NOTE — Telephone Encounter (Signed)
Left message for patient to call back and schedule Medicare Annual Wellness Visit (AWV) either virtually or in office.   Last AWV 09/04/19  please schedule at anytime with [facility] Nurse Health Advisor 1 or 2   This should be a 45 minute visit.

## 2020-12-19 DIAGNOSIS — M810 Age-related osteoporosis without current pathological fracture: Secondary | ICD-10-CM | POA: Diagnosis not present

## 2020-12-30 ENCOUNTER — Telehealth: Payer: Self-pay | Admitting: Pharmacist

## 2020-12-30 NOTE — Chronic Care Management (AMB) (Signed)
Chronic Care Management Pharmacy Assistant   Name: NEYTIRI ASCHE  MRN: [id number] DOB: 09/16/1934  JEWELENE MAIRENA is an 86 y.o. year old female who presents for herinitial CCM visit with the clinical pharmacist.  Reason for Encounter: Chart prep for initial visit with [name] the clinical pharmacist on 01/05/21.   Conditions to be addressed/monitored: Atrial Fibrillation, HTN, HLD, CKD Stage 3, and Hypothyroidism and Osteoporosis.  Primary concerns for visit include: Her back has been bothering her a lot lately.   Recent office visits:  09/22/20 Betty Martinique MD (PCP) - seen for routine general medical exam and other chronic conditions. Discontinued hydrocodone-acetaminophen 5-233m. Patient received a cyanobalamin B-12 injection of 100048m in office. Follow up in 1 year. 5-6 weeks for B-12 injection, lab in 3-4 months.   Recent consult visits:  08/28/20 [facility] - patient presented to clinic for follow up on rheumatoid arthritis, long term drug therapy, spondylosis of cervical region and other chronic conditions. No medication changes or follow up noted.   Hospital visits:  None in previous 6 months  Medications: Outpatient Encounter Medications as of 12/30/2020  Medication Sig   alendronate (FOSAMAX) 70 MG tablet    amoxicillin (AMOXIL) 500 MG tablet Take 4 tablet by mouth   BEFORE DENTAL PROCEDURE.   B-D 3CC LUER-LOK SYR 25GX1" 25G X 1" 3 ML MISC USE AS DIRECTED   calcium carbonate (OS-CAL) 600 MG TABS tablet Take by mouth.   Cholecalciferol (VITAMIN D3) 5000 UNITS TABS Take 10,000 tablets by mouth daily.    cyanocobalamin (,VITAMIN B-12,) 1000 MCG/ML injection INJECT 1 ML EVERY 3 WEEKS.   docusate sodium (COLACE) 100 MG capsule Take 1 capsule (100 mg total) by mouth 2 (two) times daily.   etanercept (ENBREL) 50 MG/ML injection Inject 50 mg into the skin once a week. Thursdays   folic acid (FOLVITE) 1 MG tablet Take 1 mg by mouth daily.   irbesartan  (AVAPRO) 150 MG tablet Take 1 tablet (150 mg total) by mouth daily.   levothyroxine (SYNTHROID) 50 MCG tablet TAKE 1 TABLET IN THE MORNING ON AN EMPTY STOMACH.   methotrexate (RHEUMATREX) 2.5 MG tablet Take 20 mg by mouth every Monday.    nitrofurantoin (MACRODANTIN) 100 MG capsule Take 100 mg by mouth daily. Urologist.   No facility-administered encounter medications on file as of 12/30/2020.   Fill History: ALENDRONATE  TAB 70MG 07/23/2019 28   IRBESARTAN   TAB 150MG 0176/16/[id number]   FOLIC ACID   TAB 101062IRS2/11/2019 30   ENBREL       INJ 50MG/ML 07/17/2019 28   CYANOCOBALAM INJ 1000MCG 12/26/2018 84   LEVOTHYROXIN TAB 25MCG 07/14/2019 90   METHOTREXATE TAB 2.5MG 07/21/2019 28   NITROFUR MAC CAP 100MG 06/21/2019 7   Initial Questions: Have you seen any other providers since your last visit?  Yes. Patient has seen rheumatologist.  Any changes in your medications or health?  No changes in either.  Any side effects from any medications?  No side effects at this time.  Do you have an symptoms or problems not managed by your medications?  Patient states her medications are keeping things where they are but nothing gets better or worse.  Any concerns about your health right now?  Patient is concerned about her back. She is working on getting an appointment with a neurosurgeon for shots in her back.  Has your provider asked that you check blood pressure, blood sugar, or follow special diet at  home?  No patient has never been asked to keep a check on any of the above or follow a special diet.  Do you get any type of exercise on a regular basis?  Patient is able to do all housework and she is able to do some walking. Patient keeps up her flowers on her deck.  Can you think of a goal you would like to reach for your health?  Patient would like to get her back pain under control so she can have a better lifestyle and be more comfortable doing more.  Do you have any problems getting  your medications?  No issues with her medications at this time.  Is there anything that you would like to discuss during the appointment? Patient has nothing at this time.   Please bring medications and supplements to appointment   Care Gaps:  AWV - patient to call back per telephone note 12/17/20 to schedule. Zoster vaccines [facility]) - overdue COVID - 19 vaccine - dose 3 overdue since 09/18/19  Star Rating Drugs:  Irbesartan 158m - [facility]  [place] Clinical Pharmacist Assistant ([phone]

## 2021-01-01 ENCOUNTER — Ambulatory Visit (INDEPENDENT_AMBULATORY_CARE_PROVIDER_SITE_OTHER): Payer: Medicare Other

## 2021-01-01 ENCOUNTER — Other Ambulatory Visit: Payer: Self-pay

## 2021-01-01 DIAGNOSIS — E538 Deficiency of other specified B group vitamins: Secondary | ICD-10-CM

## 2021-01-01 MED ORDER — CYANOCOBALAMIN 1000 MCG/ML IJ SOLN
1000.0000 ug | Freq: Once | INTRAMUSCULAR | Status: AC
  Administered 2021-01-01: 1000 ug via INTRAMUSCULAR

## 2021-01-01 NOTE — Progress Notes (Signed)
Per orders of Martinique, Betty G, MD, injection of B12 given in Right deltoid by Edwina Grossberg D Koston Hennes. Patient tolerated injection well.  Lab Results  Component Value Date   [id number] 749 09/12/2020

## 2021-01-04 NOTE — Progress Notes (Signed)
Chronic Care Management Pharmacy Note  01/11/2021 Name:  Jennifer Goodman MRN:  [id number] DOB:  11/02/1934  Summary: BP is at goal < 140/90 per office readings but does not check at home Pt wishes to continue vitamin B12 injections indefinitely  Recommendations/Changes made from today's visit: -Recommended routine monitoring of BP at home -Recommend repeat vitamin D level with upcoming labs -Patient will increase calcium intake through dietary sources to get 1200 mg daily as recommended  Plan: Follow up BP assessment in 2 months  Subjective: Jennifer Goodman is an 86 y.o. year old female who is a primary patient of [place], Malka So, Goodman.  The CCM team was consulted for assistance with disease management and care coordination needs.    Engaged with patient face to face for initial visit in response to provider referral for pharmacy case management and/or care coordination services.   Consent to Services:  The patient was given the following information about Chronic Care Management services today, agreed to services, and gave verbal consent: 1. CCM service includes personalized support from designated clinical staff supervised by the primary care provider, including individualized plan of care and coordination with other care providers 2. 24/7 contact phone numbers for assistance for urgent and routine care needs. 3. Service will only be billed when office clinical staff spend 20 minutes or more in a month to coordinate care. 4. Only one practitioner may furnish and bill the service in a calendar month. 5.The patient may stop CCM services at any time (effective at the end of the month) by phone call to the office staff. 6. The patient will be responsible for cost sharing (co-pay) of up to 20% of the service fee (after annual deductible is met). Patient agreed to services and consent obtained.  Patient Care Team: [place], Jennifer Goodman, Goodman as PCP - General (Family Medicine) Jennifer Craver, Goodman as Attending  Physician (Gastroenterology) Jennifer Goodman, [facility] as Pharmacist (Pharmacist)  Recent office visits: 01/01/21 Jennifer Goodman, CMA: Patient presented for vitamin B12 injection.  09/22/20 Jennifer Goodman (PCP) - seen for routine general medical exam and other chronic conditions. Discontinued hydrocodone-acetaminophen 5-221m. Patient received a cyanobalamin B-12 injection of 100047m in office. Follow up in 1 year. 5-6 weeks for B-12 injection, lab in 3-4 months.   Recent consult visits: 11/28/20 Jennifer Goodman): Unable to access notes.  11/13/20 JaLeigh Goodman): Unable to access notes.  10/08/20 JaLeigh Goodman): Unable to access notes.  08/28/20 JaLeigh Goodman) - patient presented to clinic for follow up on rheumatoid arthritis, long term drug therapy, spondylosis of cervical region and other chronic conditions. No medication changes or follow up noted.   Hospital visits: None in previous 6 months   Objective:  Lab Results  Component Value Date   CREATININE 0.98 09/12/2020   BUN 21 09/12/2020   GFR 53.39 (L) 09/12/2020   GFRNONAA 47 (L) 11/19/2017   GFRAA 54 (L) 11/19/2017   NA 140 09/12/2020   K 4.5 09/12/2020   CALCIUM 10.1 09/12/2020   CO2 27 09/12/2020   GLUCOSE 99 09/12/2020    Lab Results  Component Value Date/Time   GFR 53.39 (L) 09/12/2020 09:35 AM   GFR 53.16 (L) 08/23/2018 10:03 AM   MICROALBUR 1.0 08/23/2018 10:03 AM   MICROALBUR 1.4 08/24/2017 11:20 AM    Last diabetic Eye exam: No results found for: HMDIABEYEEXA  Last diabetic Foot exam: No results found for: HMDIABFOOTEX   Lab Results  Component Value Date  CHOL 204 (H) 09/12/2020   HDL 106.40 09/12/2020   LDLCALC 85 09/12/2020   TRIG 64.0 09/12/2020   CHOLHDL 2 09/12/2020    Hepatic Function Latest Ref Rng & Units 05/25/2019 11/16/2017 05/18/2016  Total Protein 6.5 - 8.1 Goodman/dL - 6.0(L) 6.4(L)  Albumin 3.5 - 5.0 4.0 3.7 4.0  AST 13 - 35 33 29 32  ALT 7 - 35  31 18 19   Alk Phosphatase 25 - 125 63 62 60  Total Bilirubin 0.3 - 1.2 mg/dL - 1.6(H) 1.3(H)    Lab Results  Component Value Date/Time   TSH 2.77 09/12/2020 09:35 AM   TSH 2.54 09/04/2019 12:46 PM    CBC Latest Ref Rng & Units 09/12/2020 05/25/2019 11/19/2017  WBC 4.0 - 10.5 K/uL 6.8 7.0 8.1  Hemoglobin 12.0 - 15.0 Goodman/dL 11.6(L) 11.6(A) 10.9(L)  Hematocrit 36.0 - 46.0 % 35.8(L) 34(A) 35.1(L)  Platelets 150.0 - 400.0 K/uL 247.0 310 212    Lab Results  Component Value Date/Time   VD25OH 89.52 02/24/2018 11:31 AM    Clinical ASCVD: No  The ASCVD Risk score Jennifer Goodman DC Jr., et al., 2013) failed to calculate for the following reasons:   The 2013 ASCVD risk score is only valid for ages 51 to 29    Depression screen PHQ 2/9 09/22/2020 09/04/2019 08/29/2018  Decreased Interest 0 0 0  Down, Depressed, Hopeless 0 0 0  PHQ - 2 Score 0 0 0     Social History   Tobacco Use  Smoking Status Former   Types: Cigarettes   Quit date: 06/14/1978   Years since quitting: 42.6  Smokeless Tobacco Never  Tobacco Comments   quit 32 yrs ago   BP Readings from Last 3 Encounters:  01/05/21 136/76  09/22/20 130/70  09/19/19 120/74   Pulse Readings from Last 3 Encounters:  09/22/20 88  09/19/19 78  09/04/19 74   Wt Readings from Last 3 Encounters:  09/22/20 147 lb 2 oz (66.7 kg)  09/19/19 155 lb 9.6 oz (70.6 kg)  09/04/19 152 lb 12.8 oz (69.3 kg)   BMI Readings from Last 3 Encounters:  09/22/20 25.25 kg/m  09/19/19 26.71 kg/m  09/04/19 26.23 kg/m    Assessment/Interventions: Review of patient past medical history, allergies, medications, health status, including review of consultants reports, laboratory and other test data, was performed as part of comprehensive evaluation and provision of chronic care management services.   SDOH:  (Social Determinants of Health) assessments and interventions performed: Yes SDOH Interventions    Flowsheet Row Most Recent Value  SDOH Interventions    Financial Strain Interventions Intervention Not Indicated  Transportation Interventions Intervention Not Indicated      SDOH Screenings   Alcohol Screen: Not on file  Depression (PHQ2-9): Low Risk    PHQ-2 Score: 0  Financial Resource Strain: Low Risk    Difficulty of Paying Living Expenses: Not hard at all  Food Insecurity: Not on file  Housing: Not on file  Physical Activity: Not on file  Social Connections: Not on file  Stress: Not on file  Tobacco Use: Medium Risk   Smoking Tobacco Use: Former   Smokeless Tobacco Use: Never  Transportation Needs: No Transportation Needs   Lack of Transportation (Medical): No   Lack of Transportation (Non-Medical): No   Patient does not have a particular routine as every day is different. She does her own housework, waters her plants and enjoys reading. She doesn't go out a lot because of COVID and is wearing  a mask in stores.   She has been trying to get back into walking and has a treadmill. She used to walk 2.5-4 miles a day and then had 2 knee replacements and then tore her achilles. She also has neck pain right now, strained her rotator cuff and this has been going on for years. She has been walking with ski poles for half a mile on days that aren't too hot and wants to get back to playing golf. She is trying to work on her balance by walking up and down steps.  Patient is not following any diet in particular but usually does [employer] when she wants to lose weight. She tries not to eat many sweets. She eats mostly vegetables and meat ocassionally but not every day. She doesn't like the taste of chicken and prefers beef. She does eat beans and nuts, and not many potatoes. She doesn't like water and drinks tea with artificial sweetener and not many soft drinks, as well as 2 cups of coffee in the morning.  Patient sleeps well but wakes up a lot to more to go to the bathroom during the night. She doesn't stay awake and goes back to sleep. She often  rests for 1 hour after lunch.  Patient was doing injections for vitamin B12 before and they were stopped and she doesn't understand why. She does feel like she is having more energy since restarting. She does not wish to stop the injections. She denies any other problems with her medications.  CCM Care Plan  Allergies  Allergen Reactions   Oxycodone Other (See Comments)    Makes hallucinates   Sulfamethoxazole Nausea Only    Medications Reviewed Today     Reviewed by Jennifer Goodman, [facility] (Pharmacist) on 01/05/21 at 1208  Med List Status: <None>   Medication Order Taking? Sig Documenting Provider Last Dose Status Informant  amoxicillin (AMOXIL) 500 MG tablet [id number] Yes Take 4 tablet by mouth   BEFORE DENTAL PROCEDURE. Provider, Historical, Goodman Taking Active   B-D 3CC LUER-LOK SYR 25GX1" 25G X 1" 3 ML MISC [id number]  USE AS DIRECTED [place], Jennifer Goodman, Goodman  Active   Cholecalciferol (VITAMIN D3) 5000 UNITS TABS [id number] Yes Take 5,000 tablets by mouth daily. Provider, Historical, Goodman Taking Active Self  cyanocobalamin (,VITAMIN B-12,) 1000 MCG/ML injection [id number] Yes INJECT 1 ML EVERY 3 WEEKS. [place], Jennifer Goodman, Goodman Taking Active   docusate sodium (COLACE) 100 MG capsule [id number] Yes Take 1 capsule (100 mg total) by mouth 2 (two) times daily.  Patient taking differently: Take 100 mg by mouth daily.   Regalado, Belkys A, Goodman Taking Active   etanercept (ENBREL) 50 MG/ML injection [id number] Yes Inject 50 mg into the skin once a week. Thursdays Provider, Historical, Goodman Taking Active Self           Med Note Leslie Dales, ANNA E   Tue Nov 16, 1570  6:20 PM)    folic acid (FOLVITE) 1 MG tablet [id number] Yes Take 1 mg by mouth daily. Provider, Historical, Goodman Taking Active Self  HYDROcodone-acetaminophen (NORCO/VICODIN) 5-325 MG tablet [id number] Yes Take 1 tablet by mouth every 8 (eight) hours as needed for moderate pain. Provider, Historical, Goodman Taking Active   irbesartan (AVAPRO) 150 MG tablet  [id number] Yes Take 1 tablet (150 mg total) by mouth daily. [place], Jennifer Goodman, Goodman Taking Active   levothyroxine (SYNTHROID) 50 MCG tablet [id number] Yes TAKE 1 TABLET IN THE MORNING ON AN EMPTY STOMACH. [place], Jennifer Goodman, Goodman  Taking Active   methotrexate (RHEUMATREX) 2.5 MG tablet [id number] Yes Take 20 mg by mouth every Monday.  Provider, Historical, Goodman Taking Active Self           Med Note Leslie Dales, ANNA E   Tue Nov 15, 2017  9:10 PM)    nitrofurantoin (MACRODANTIN) 100 MG capsule [id number] Yes Take 100 mg by mouth daily. Urologist. Provider, Historical, Goodman Taking Active   zoledronic acid (RECLAST) 5 MG/100ML SOLN injection [id number] Yes Inject 5 mg into the vein once. Provider, Historical, Goodman Taking Active             Patient Active Problem List   Diagnosis Date Noted   Hypercholesterolemia 02/24/2018   Paroxysmal atrial fibrillation ([place])    Fall 11/15/2017   Displaced fracture of fifth metatarsal bone, left foot, initial encounter for closed fracture 11/15/2017   Achilles tendon avulsion 11/15/2017   Fracture, foot, left, closed, initial encounter 11/15/2017   Hypertension, essential, benign 08/24/2017   Hypothyroidism (acquired) 08/24/2017   CKD (chronic kidney disease), stage III ([place]) 08/24/2017   Infection of urinary tract 05/29/2016   Primary osteoarthritis of right knee 05/28/2016   Primary osteoarthritis of left knee 06/20/2015   Osteoporosis 06/20/2015   Closed displaced fracture of medial condyle of left femur ([place]) 06/20/2015   Abdominal pain, RLQ (right lower quadrant) 05/19/2012    Immunization History  Administered Date(s) Administered   Fluad Quad(high Dose 65+) 04/11/2019, 04/23/2020   Influenza, High Dose Seasonal PF 02/24/2018   PFIZER(Purple Top)SARS-COV-2 Vaccination 07/29/2019, 08/21/2019, 03/03/2020, 10/28/2020   Pneumococcal Polysaccharide-23 12/21/2008    Conditions to be addressed/monitored:  Hypertension, Chronic Kidney Disease, Hypothyroidism,  Osteoporosis, Osteoarthritis, and Vitamin B12 deficiency and Rheumatoid arthritis   Care Plan : [place]  Updates made by Jennifer Goodman, [place] since 01/11/2021 12:00 AM     Problem: Problem: Hypertension, Chronic Kidney Disease, Hypothyroidism, Osteopenia, Osteoarthritis, and Vitamin B12 deficiency and Rheumatoid arthritis      Long-Range Goal: Patient-Specific Goal   Start Date: 01/05/2021  Expected End Date: 01/05/2022  This Visit's Progress: On track  Priority: High  Note:   Current Barriers:  Unable to independently monitor therapeutic efficacy  Pharmacist Clinical Goal(s):  Patient will achieve adherence to monitoring guidelines and medication adherence to achieve therapeutic efficacy through collaboration with PharmD and provider.   Interventions: 1:1 collaboration with [place], Jennifer Goodman, Goodman regarding development and update of comprehensive plan of care as evidenced by provider attestation and co-signature Inter-disciplinary care team collaboration (see longitudinal plan of care) Comprehensive medication review performed; medication list updated in electronic medical record  Hypertension  (Status:Goal on track: YES.)   Med Management Intervention:  No medication changes  (BP goal <140/90) -Controlled -Current treatment: Irbesartan 150 mg 1 tablet daily - in AM -Medications previously tried: valsartan  -Current home readings: does not check regularly (purchased an arm machine) -Current dietary habits: doesn't pay attention to salt intake; uses lower sodium salt -Current exercise habits: walking some -Denies hypotensive/hypertensive symptoms -Educated on BP goals and benefits of medications for prevention of heart attack, stroke and kidney damage; Exercise goal of 150 minutes per week; Importance of home blood pressure monitoring; Proper BP monitoring technique; -Counseled to monitor BP at home weekly, document, and provide log at future  appointments -Counseled on diet and exercise extensively Recommended to continue current medication  Osteopenia (Goal prevent fractures) -Controlled -Last DEXA Scan: 03/29/2018   T-Score femoral neck: -1.9  T-Score total hip: n/a  T-Score lumbar spine:  n/a  T-Score forearm radius: -1.7  10-year probability of major osteoporotic fracture: 17.2%  10-year probability of hip fracture: 4.5% -Patient is a candidate for pharmacologic treatment due to T-Score -1.0 to -2.5 and 10-year risk of hip fracture > 3% -Current treatment  Reclast 5 mg inject yearly  (injection 3 weeks ago) Vitamin D 5000 units daily Calcium carbonate 600 mg  -Medications previously tried: Alendronate (trouble with timing) -Recommend [phone] units of vitamin D daily. Recommend 1200 mg of calcium daily from dietary and supplemental sources. Recommend weight-bearing and muscle strengthening exercises for building and maintaining bone density. -Recommended to continue current medication Recommended for patient to add more calcium to her diet as she prefers this route than additional supplementation.  Rheumatoid arthritis (Goal: minimize pain) -Controlled -Current treatment  Methotrexate 2.5 mg 20 mg every Monday Folic acid 1 mg  1 tablet daily Enbrel 50 mg/mL inject once weekly -Medications previously tried: n/a  -Recommended to continue current medication Patient is receiving patient assistance for Enbrel.  Pain (Goal: minimize pain) -Controlled -Current treatment  Hydrocodone-APAP 5-325 mg 1 tablet in morning and 1 tablet at night -Medications previously tried: n/a  -Recommended to continue current medication  Hypothyroidism (Goal: TSH 2.5-3.5 - given concurrent osteopenia) -Controlled -Current treatment  Levothyroxine 50 mcg 1 tablet daily -Medications previously tried: none  -Recommended to continue current medication Counseled on importance of taking on an empty stomach prior to food.  Vitamin B12  deficiency (Goal: 211-911) -Controlled -Current treatment  Vitamin B12 Inject 1000 mcg every 4 weeks -Medications previously tried: none  -Recommended to continue current medication Counseled on possible supplementation with sublingual tablets if patient does not continue with injections.   Health Maintenance -Vaccine gaps: shingrix (rhuematologist said no), COVID booster -Current therapy:  Amoxicillin 500 mg prior to dental procedure Docusate 100 mg 1 capsule once daily -Educated on Cost vs benefit of each product must be carefully weighed by individual consumer -Patient is satisfied with current therapy and denies issues -Recommended to continue current medication  Patient Goals/Self-Care Activities Patient will:  - take medications as prescribed check blood pressure weekly, document, and provide at future appointments target a minimum of 150 minutes of moderate intensity exercise weekly  Follow Up Plan: Telephone follow up appointment with care management team member scheduled for: 6 months       Medication Assistance:  Enbrel obtained through [place] medication assistance program.  Enrollment ends summer 2023  Compliance/Adherence/Medication fill history: Care Gaps: Shingrix, COVID booster  Star-Rating Drugs: Irbesartan - last filled 12/08/20 for 90 ds [employer]  Patient's preferred pharmacy is:  [facility], [place] [address] Phone: [phone] Fax: [phone]  Uses pill box? Yes Pt endorses 100% compliance - never misses one  We discussed: Current pharmacy is preferred with insurance plan and patient is satisfied with pharmacy services Patient decided to: Continue current medication management strategy  Care Plan and Follow Up Patient Decision:  Patient agrees to Care Plan and Follow-up.  Plan: Telephone follow up appointment with care management team member scheduled  for:  6 months  [name], PharmD, [place] Pharmacist [place] [facility] [place] [phone]

## 2021-01-05 ENCOUNTER — Ambulatory Visit (INDEPENDENT_AMBULATORY_CARE_PROVIDER_SITE_OTHER): Payer: Medicare Other | Admitting: Pharmacist

## 2021-01-05 ENCOUNTER — Other Ambulatory Visit: Payer: Self-pay

## 2021-01-05 VITALS — BP 136/76

## 2021-01-05 DIAGNOSIS — I10 Essential (primary) hypertension: Secondary | ICD-10-CM

## 2021-01-05 DIAGNOSIS — I480 Paroxysmal atrial fibrillation: Secondary | ICD-10-CM | POA: Diagnosis not present

## 2021-01-11 NOTE — Patient Instructions (Addendum)
Hi Jennifer Goodman,  It was great to get to meet you in person! Below is a summary of some of the topics we discussed. I will likely have my assistant check in with you in a month or two to see how things are going with checking your blood pressure at home as well.  Please reach out to me if you have any questions or need anything before our follow up!  Best, Maddie  [name], PharmD, [place] [facility] [place]   Visit Information   Goals Addressed   None    There are no care plans to display for this patient.   [name] was given information about Chronic Care Management services today including:  CCM service includes personalized support from designated clinical staff supervised by her physician, including individualized plan of care and coordination with other care providers 24/7 contact phone numbers for assistance for urgent and routine care needs. Standard insurance, coinsurance, copays and deductibles apply for chronic care management only during months in which we provide at least 20 minutes of these services. Most insurances cover these services at 100%, however patients may be responsible for any copay, coinsurance and/or deductible if applicable. This service may help you avoid the need for more expensive face-to-face services. Only one practitioner may furnish and bill the service in a calendar month. The patient may stop CCM services at any time (effective at the end of the month) by phone call to the office staff.  Patient agreed to services and verbal consent obtained.   The patient verbalized understanding of instructions, educational materials, and care plan provided today and agreed to receive a mailed copy of patient instructions, educational materials, and care plan.  Telephone follow up appointment with pharmacy team member scheduled for: 6 months  Viona Gilmore, [facility]

## 2021-01-20 ENCOUNTER — Ambulatory Visit (INDEPENDENT_AMBULATORY_CARE_PROVIDER_SITE_OTHER): Payer: Medicare Other

## 2021-01-20 DIAGNOSIS — Z0000 Encounter for general adult medical examination without abnormal findings: Secondary | ICD-10-CM

## 2021-01-20 NOTE — Patient Instructions (Signed)
Jennifer Goodman , Thank you for taking time to come for your Medicare Wellness Visit. I appreciate your ongoing commitment to your health goals. Please review the following plan we discussed and let me know if I can assist you in the future.   Screening recommendations/referrals: Colonoscopy: no longer required  Mammogram: no longer required  Bone Density: 03/29/2018 Recommended yearly ophthalmology/optometry visit for glaucoma screening and checkup Recommended yearly dental visit for hygiene and checkup  Vaccinations: Influenza vaccine: due in fall 2022  Pneumococcal vaccine:  completed series  Tdap vaccine: due with injury  Shingles vaccine: will   obtain local pharmacy   Advanced directives: will provide copies   Conditions/risks identified: none   Next appointment: none    Preventive Care 55 Years and Older, Female Preventive care refers to lifestyle choices and visits with your health care provider that can promote health and wellness. What does preventive care include? A yearly physical exam. This is also called an annual well check. Dental exams once or twice a year. Routine eye exams. Ask your health care provider how often you should have your eyes checked. Personal lifestyle choices, including: Daily care of your teeth and gums. Regular physical activity. Eating a healthy diet. Avoiding tobacco and drug use. Limiting alcohol use. Practicing safe sex. Taking low-dose aspirin every day. Taking vitamin and mineral supplements as recommended by your health care provider. What happens during an annual well check? The services and screenings done by your health care provider during your annual well check will depend on your age, overall health, lifestyle risk factors, and family history of disease. Counseling  Your health care provider may ask you questions about your: Alcohol use. Tobacco use. Drug use. Emotional well-being. Home and relationship well-being. Sexual  activity. Eating habits. History of falls. Memory and ability to understand (cognition). Work and work Statistician. Reproductive health. Screening  You may have the following tests or measurements: Height, weight, and BMI. Blood pressure. Lipid and cholesterol levels. These may be checked every 5 years, or more frequently if you are over 63 years old. Skin check. Lung cancer screening. You may have this screening every year starting at age 30 if you have a 30-pack-year history of smoking and currently smoke or have quit within the past 15 years. Fecal occult blood test (FOBT) of the stool. You may have this test every year starting at age 45. Flexible sigmoidoscopy or colonoscopy. You may have a sigmoidoscopy every 5 years or a colonoscopy every 10 years starting at age 45. Hepatitis C blood test. Hepatitis B blood test. Sexually transmitted disease (STD) testing. Diabetes screening. This is done by checking your blood sugar (glucose) after you have not eaten for a while (fasting). You may have this done every 1-3 years. Bone density scan. This is done to screen for osteoporosis. You may have this done starting at age 44. Mammogram. This may be done every 1-2 years. Talk to your health care provider about how often you should have regular mammograms. Talk with your health care provider about your test results, treatment options, and if necessary, the need for more tests. Vaccines  Your health care provider may recommend certain vaccines, such as: Influenza vaccine. This is recommended every year. Tetanus, diphtheria, and acellular pertussis (Tdap, Td) vaccine. You may need a Td booster every 10 years. Zoster vaccine. You may need this after age 35. Pneumococcal 13-valent conjugate (PCV13) vaccine. One dose is recommended after age 64. Pneumococcal polysaccharide (PPSV23) vaccine. One dose is recommended after  age 25. Talk to your health care provider about which screenings and vaccines  you need and how often you need them. This information is not intended to replace advice given to you by your health care provider. Make sure you discuss any questions you have with your health care provider. Document Released: 06/27/2015 Document Revised: 02/18/2016 Document Reviewed: 04/01/2015 Elsevier Interactive Patient Education  2017 [employer] Prevention in the Home Falls can cause injuries. They can happen to people of all ages. There are many things you can do to make your home safe and to help prevent falls. What can I do on the outside of my home? Regularly fix the edges of walkways and driveways and fix any cracks. Remove anything that might make you trip as you walk through a door, such as a raised step or threshold. Trim any bushes or trees on the path to your home. Use bright outdoor lighting. Clear any walking paths of anything that might make someone trip, such as rocks or tools. Regularly check to see if handrails are loose or broken. Make sure that both sides of any steps have handrails. Any raised decks and porches should have guardrails on the edges. Have any leaves, snow, or ice cleared regularly. Use sand or salt on walking paths during winter. Clean up any spills in your garage right away. This includes oil or grease spills. What can I do in the bathroom? Use night lights. Install grab bars by the toilet and in the tub and shower. Do not use towel bars as grab bars. Use non-skid mats or decals in the tub or shower. If you need to sit down in the shower, use a plastic, non-slip stool. Keep the floor dry. Clean up any water that spills on the floor as soon as it happens. Remove soap buildup in the tub or shower regularly. Attach bath mats securely with double-sided non-slip rug tape. Do not have throw rugs and other things on the floor that can make you trip. What can I do in the bedroom? Use night lights. Make sure that you have a light by your bed that  is easy to reach. Do not use any sheets or blankets that are too big for your bed. They should not hang down onto the floor. Have a firm chair that has side arms. You can use this for support while you get dressed. Do not have throw rugs and other things on the floor that can make you trip. What can I do in the kitchen? Clean up any spills right away. Avoid walking on wet floors. Keep items that you use a lot in easy-to-reach places. If you need to reach something above you, use a strong step stool that has a grab bar. Keep electrical cords out of the way. Do not use floor polish or wax that makes floors slippery. If you must use wax, use non-skid floor wax. Do not have throw rugs and other things on the floor that can make you trip. What can I do with my stairs? Do not leave any items on the stairs. Make sure that there are handrails on both sides of the stairs and use them. Fix handrails that are broken or loose. Make sure that handrails are as long as the stairways. Check any carpeting to make sure that it is firmly attached to the stairs. Fix any carpet that is loose or worn. Avoid having throw rugs at the top or bottom of the stairs. If you do  have throw rugs, attach them to the floor with carpet tape. Make sure that you have a light switch at the top of the stairs and the bottom of the stairs. If you do not have them, ask someone to add them for you. What else can I do to help prevent falls? Wear shoes that: Do not have high heels. Have rubber bottoms. Are comfortable and fit you well. Are closed at the toe. Do not wear sandals. If you use a stepladder: Make sure that it is fully opened. Do not climb a closed stepladder. Make sure that both sides of the stepladder are locked into place. Ask someone to hold it for you, if possible. Clearly mark and make sure that you can see: Any grab bars or handrails. First and last steps. Where the edge of each step is. Use tools that help you  move around (mobility aids) if they are needed. These include: Canes. Walkers. Scooters. Crutches. Turn on the lights when you go into a dark area. Replace any light bulbs as soon as they burn out. Set up your furniture so you have a clear path. Avoid moving your furniture around. If any of your floors are uneven, fix them. If there are any pets around you, be aware of where they are. Review your medicines with your doctor. Some medicines can make you feel dizzy. This can increase your chance of falling. Ask your doctor what other things that you can do to help prevent falls. This information is not intended to replace advice given to you by your health care provider. Make sure you discuss any questions you have with your health care provider. Document Released: 03/27/2009 Document Revised: 11/06/2015 Document Reviewed: 07/05/2014 Elsevier Interactive Patient Education  2017 [employer].

## 2021-01-20 NOTE — Progress Notes (Signed)
Subjective:   Jennifer Goodman is a 86 y.o. female who presents for Medicare Annual (Subsequent) preventive examination.   I connected with Thomasene Mohair today by telephone and verified that I am speaking with the correct person using two identifiers. Location patient: home Location provider: work Persons participating in the virtual visit: patient, provider.   I discussed the limitations, risks, security and privacy concerns of performing an evaluation and management service by telephone and the availability of in person appointments. I also discussed with the patient that there may be a patient responsible charge related to this service. The patient expressed understanding and verbally consented to this telephonic visit.    Interactive audio and video telecommunications were attempted between this provider and patient, however failed, due to patient having technical difficulties OR patient did not have access to video capability.  We continued and completed visit with audio only.    Review of Systems    N/a       Objective:    There were no vitals filed for this visit. There is no height or weight on file to calculate BMI.  Advanced Directives 11/16/2017 02/25/2017 02/18/2017 05/18/2016 06/27/2015 06/20/2015 06/10/2015  Does Patient Have a Medical Advance Directive? Yes - Yes Yes Yes Yes Yes  Type of Paramedic of [place];Living will - [place];Living will [place];Living will [place];Living will [place];Living will [place];Living will  Does patient want to make changes to medical advance directive? No - Patient declined No - Patient declined No - Patient declined - No - Patient declined No - Patient declined No - Patient declined  Copy of [place] in Chart? No - copy requested - No - copy requested No - copy requested No - copy requested No - copy  requested Yes    Current Medications (verified) Outpatient Encounter Medications as of 01/20/2021  Medication Sig   amoxicillin (AMOXIL) 500 MG tablet Take 4 tablet by mouth   BEFORE DENTAL PROCEDURE.   B-D 3CC LUER-LOK SYR 25GX1" 25G X 1" 3 ML MISC USE AS DIRECTED   Cholecalciferol (VITAMIN D3) 5000 UNITS TABS Take 5,000 tablets by mouth daily.   cyanocobalamin (,VITAMIN B-12,) 1000 MCG/ML injection INJECT 1 ML EVERY 3 WEEKS.   docusate sodium (COLACE) 100 MG capsule Take 1 capsule (100 mg total) by mouth 2 (two) times daily. (Patient taking differently: Take 100 mg by mouth daily.)   etanercept (ENBREL) 50 MG/ML injection Inject 50 mg into the skin once a week. Thursdays   folic acid (FOLVITE) 1 MG tablet Take 1 mg by mouth daily.   HYDROcodone-acetaminophen (NORCO/VICODIN) 5-325 MG tablet Take 1 tablet by mouth every 8 (eight) hours as needed for moderate pain.   irbesartan (AVAPRO) 150 MG tablet Take 1 tablet (150 mg total) by mouth daily.   levothyroxine (SYNTHROID) 50 MCG tablet TAKE 1 TABLET IN THE MORNING ON AN EMPTY STOMACH.   methotrexate (RHEUMATREX) 2.5 MG tablet Take 20 mg by mouth every Monday.    nitrofurantoin (MACRODANTIN) 100 MG capsule Take 100 mg by mouth daily. Urologist.   zoledronic acid (RECLAST) 5 MG/100ML SOLN injection Inject 5 mg into the vein once.   No facility-administered encounter medications on file as of 01/20/2021.    Allergies (verified) Oxycodone and Sulfamethoxazole   History: Past Medical History:  Diagnosis Date   Anemia    Arthritis    Rhuematoid-takes Enbrel and Methorexate  Back pain    states in Jan 2017 spinal was attempted 4 times and has had back pain since.   Cancer ([address])    skin cancer on head   Chicken pox    Diverticulosis    History of colon polyps    benign   Hypertension    takes Avapro daily   Hypothyroidism    takes Synthroid daily   Joint pain    Pneumonia    at age 81   PONV (postoperative nausea and vomiting)     the patch helps!   Urinary frequency    Urinary urgency    was taking Myrbetriq but stopped.Plans to start back   Past Surgical History:  Procedure Laterality Date   ACHILLES TENDON SURGERY Left 11/17/2017   Procedure: ACHILLES TENDON REPAIR;  Surgeon: Melrose Nakayama, MD;  Location: [facility];  Service: Orthopedics;  Laterality: Left;   CHOLECYSTECTOMY  2000   COLONOSCOPY     EYE SURGERY Left    tear duct surgery   HAND TENDON SURGERY     Dr. Amedeo Plenty   KNEE ARTHROSCOPY     ORIF CALCANEOUS FRACTURE Left 11/17/2017   Procedure: OPEN TREATMENT OF CALCANEOUS FRACTURE;  Surgeon: Melrose Nakayama, MD;  Location: [facility];  Service: Orthopedics;  Laterality: Left;  Patient is now an inpatient at [facility] will request that she be moved to [facility] and admitted there for this procedure to be done there.   ORIF ELBOW FRACTURE Right 02/25/2017   Procedure: OPEN REDUCTION INTERNAL FIXATION (ORIF) RIGHT PROXIMAL OLECRANON FRACTURE;  Surgeon: Iran Planas, MD;  Location: [place];  Service: Orthopedics;  Laterality: Right;   TOTAL KNEE ARTHROPLASTY Left 06/20/2015   TOTAL KNEE ARTHROPLASTY Left 06/20/2015   Procedure: TOTAL KNEE ARTHROPLASTY;  Surgeon: Dorna Leitz, MD;  Location: [place];  Service: Orthopedics;  Laterality: Left;   TOTAL KNEE ARTHROPLASTY Right 05/28/2016   Procedure: TOTAL KNEE ARTHROPLASTY;  Surgeon: Dorna Leitz, MD;  Location: [place];  Service: Orthopedics;  Laterality: Right;   Family History  Problem Relation Age of Onset   Cancer Father        colon ca/ lung ca   Heart disease Father    Social History   Socioeconomic History   Marital status: Married    Spouse name: Not on file   Number of children: 2   Years of education: Not on file   Highest education level: Not on file  Occupational History   Occupation: Retired  Tobacco Use   Smoking status: Former    Types: Cigarettes    Quit date: 06/14/1978    Years since quitting: 42.6   Smokeless tobacco: Never   Tobacco comments:    quit 32 yrs ago   Vaping Use   Vaping Use: Never used  Substance and Sexual Activity   Alcohol use: Yes    Comment: occ   Drug use: No   Sexual activity: Never  Other Topics Concern   Not on file  Social History Narrative   She lives in [place] with her husband.   Social Determinants of Health   Financial Resource Strain: Low Risk    Difficulty of Paying Living Expenses: Not hard at all  Food Insecurity: Not on file  Transportation Needs: No Transportation Needs   Lack of Transportation (Medical): No   Lack of Transportation (Non-Medical): No  Physical Activity: Not on file  Stress: Not on file  Social Connections: Not on file    Tobacco Counseling Counseling given: Not  Answered Tobacco comments: quit 32 yrs ago   Clinical Intake:                 Diabetic?no        Activities of Daily Living In your present state of health, do you have any difficulty performing the following activities: 09/22/2020  Hearing? N  Vision? N  Difficulty concentrating or making decisions? N  Walking or climbing stairs? N  Dressing or bathing? N  Doing errands, shopping? N  Some recent data might be hidden    Patient Care Team: [place], Betty G, MD as PCP - General (Family Medicine) Juanita Craver, MD as Attending Physician (Gastroenterology) Viona Gilmore, [facility] as Pharmacist (Pharmacist)  Indicate any recent Medical Services you may have received from other than [facility] providers in the past year (date may be approximate).     Assessment:   This is a routine wellness examination for Jennifer Goodman.  Hearing/Vision screen No results found.  Dietary issues and exercise activities discussed:     Goals Addressed   None    Depression Screen PHQ 2/9 Scores 09/22/2020 09/04/2019 08/29/2018 02/24/2018  PHQ - 2 Score 0 0 0 0    Fall Risk Fall Risk  09/22/2020 09/04/2019 08/29/2018 02/24/2018  Falls in the past year? 0 0 1 Yes  Comment - - - tripped in her kitchen.  Number falls in past yr: - 0 0  1  Injury with Fall? - 0 1 Yes  Risk for fall due to : Orthopedic patient Orthopedic patient Impaired balance/gait;Orthopedic patient Impaired mobility  Follow up - Education provided Education provided -    FALL RISK PREVENTION PERTAINING TO THE HOME:  Any stairs in or around the home? Yes  If so, are there any without handrails? No  Home free of loose throw rugs in walkways, pet beds, electrical cords, etc? Yes  Adequate lighting in your home to reduce risk of falls? Yes   ASSISTIVE DEVICES UTILIZED TO PREVENT FALLS:  Life alert? No  Use of a cane, walker or w/c? No  Grab bars in the bathroom? No  Shower chair or bench in shower? No  Elevated toilet seat or a handicapped toilet? No   Cognitive Function:    Normal cognitive status assessed by direct observation by this Nurse Health Advisor. No abnormalities found.      Immunizations Immunization History  Administered Date(s) Administered   Fluad Quad(high Dose 65+) 04/11/2019, 04/23/2020   Influenza, High Dose Seasonal PF 02/24/2018   PFIZER(Purple Top)SARS-COV-2 Vaccination 07/29/2019, 08/21/2019, 03/03/2020, 10/28/2020   Pneumococcal Polysaccharide-23 12/21/2008    TDAP status: Due, Education has been provided regarding the importance of this vaccine. Advised may receive this vaccine at local pharmacy or Health Dept. Aware to provide a copy of the vaccination record if obtained from local pharmacy or Health Dept. Verbalized acceptance and understanding.  Flu Vaccine status: Up to date  Pneumococcal vaccine status: Up to date  Covid-19 vaccine status: Completed vaccines  Qualifies for Shingles Vaccine? Yes   Zostavax completed No   Shingrix Completed?: No.    Education has been provided regarding the importance of this vaccine. Patient has been advised to call insurance company to determine out of pocket expense if they have not yet received this vaccine. Advised may also receive vaccine at local pharmacy or Health  Dept. Verbalized acceptance and understanding.  Screening Tests Health Maintenance  Topic Date Due   Zoster Vaccines- Shingrix (1 of 2) Never done   INFLUENZA VACCINE  01/12/2021   COVID-19 Vaccine (5 - Booster for Pfizer series) 02/28/2021   DEXA SCAN  Completed   HPV VACCINES  Aged Out   TETANUS/TDAP  Discontinued   PNA vac Low Risk Adult  Discontinued    Health Maintenance  Health Maintenance Due  Topic Date Due   Zoster Vaccines- Shingrix (1 of 2) Never done   INFLUENZA VACCINE  01/12/2021    Colorectal cancer screening: No longer required.   Mammogram status: No longer required due to age.  Bone Density status: Completed 03/30/2015. Results reflect: Bone density results: OSTEOPOROSIS. Repeat every 2 years.  Lung Cancer Screening: (Low Dose CT Chest recommended if Age 69-80 years, 30 pack-year currently smoking OR have quit w/in 15years.) does not qualify.   Lung Cancer Screening Referral: n/a  Additional Screening:  Hepatitis C Screening: does not qualify  Vision Screening: Recommended annual ophthalmology exams for early detection of glaucoma and other disorders of the eye. Is the patient up to date with their annual eye exam?  Yes  Who is the provider or what is the name of the office in which the patient attends annual eye exams? Dr.Gould  If pt is not established with a provider, would they like to be referred to a provider to establish care? No .   Dental Screening: Recommended annual dental exams for proper oral hygiene  Community Resource Referral / Chronic Care Management: CRR required this visit?  No   CCM required this visit?  No      Plan:     I have personally reviewed and noted the following in the patient's chart:   Medical and social history Use of alcohol, tobacco or illicit drugs  Current medications and supplements including opioid prescriptions.  Functional ability and status Nutritional status Physical activity Advanced  directives List of other physicians Hospitalizations, surgeries, and ER visits in previous 12 months Vitals Screenings to include cognitive, depression, and falls Referrals and appointments  In addition, I have reviewed and discussed with patient certain preventive protocols, quality metrics, and best practice recommendations. A written personalized care plan for preventive services as well as general preventive health recommendations were provided to patient.     Randel Pigg, LPN   [id number]   Nurse Notes: none

## 2021-01-23 ENCOUNTER — Other Ambulatory Visit: Payer: Self-pay

## 2021-01-23 ENCOUNTER — Other Ambulatory Visit (INDEPENDENT_AMBULATORY_CARE_PROVIDER_SITE_OTHER): Payer: Medicare Other

## 2021-01-23 DIAGNOSIS — E538 Deficiency of other specified B group vitamins: Secondary | ICD-10-CM | POA: Diagnosis not present

## 2021-01-23 LAB — VITAMIN B12: Vitamin B-12: 434 pg/mL (ref 211–911)

## 2021-02-02 ENCOUNTER — Telehealth: Payer: Self-pay

## 2021-02-02 ENCOUNTER — Other Ambulatory Visit: Payer: Self-pay

## 2021-02-02 DIAGNOSIS — M5412 Radiculopathy, cervical region: Secondary | ICD-10-CM | POA: Diagnosis not present

## 2021-02-02 DIAGNOSIS — M4125 Other idiopathic scoliosis, thoracolumbar region: Secondary | ICD-10-CM | POA: Diagnosis not present

## 2021-02-02 DIAGNOSIS — M5416 Radiculopathy, lumbar region: Secondary | ICD-10-CM | POA: Diagnosis not present

## 2021-02-02 DIAGNOSIS — R030 Elevated blood-pressure reading, without diagnosis of hypertension: Secondary | ICD-10-CM | POA: Diagnosis not present

## 2021-02-02 NOTE — Telephone Encounter (Signed)
Patient returned call and was informed of results and verbalized understanding.  Patient scheduled next B-12 injection 8/24

## 2021-02-04 ENCOUNTER — Other Ambulatory Visit: Payer: Self-pay

## 2021-02-04 ENCOUNTER — Ambulatory Visit (INDEPENDENT_AMBULATORY_CARE_PROVIDER_SITE_OTHER): Payer: Medicare Other

## 2021-02-04 DIAGNOSIS — E538 Deficiency of other specified B group vitamins: Secondary | ICD-10-CM | POA: Diagnosis not present

## 2021-02-04 MED ORDER — CYANOCOBALAMIN 1000 MCG/ML IJ SOLN
1000.0000 ug | Freq: Once | INTRAMUSCULAR | Status: AC
  Administered 2021-02-04: 1000 ug via INTRAMUSCULAR

## 2021-02-04 NOTE — Progress Notes (Signed)
Per orders of Dr. Jordan, injection of B12 given by Ardis Lawley E Rodolphe Edmonston. Patient tolerated injection well.  

## 2021-02-06 ENCOUNTER — Other Ambulatory Visit: Payer: Self-pay | Admitting: Neurosurgery

## 2021-02-06 DIAGNOSIS — M5416 Radiculopathy, lumbar region: Secondary | ICD-10-CM

## 2021-02-06 DIAGNOSIS — M5412 Radiculopathy, cervical region: Secondary | ICD-10-CM

## 2021-02-27 ENCOUNTER — Telehealth: Payer: Self-pay | Admitting: Pharmacist

## 2021-02-27 NOTE — Chronic Care Management (AMB) (Signed)
Chronic Care Management Pharmacy Assistant   Name: Jennifer Goodman  MRN: [id number] DOB: Oct 12, 1934  Reason for Encounter: Disease State/ Hypertension Assessment Call.   Conditions to be addressed/monitored: HTN   Recent office visits:  02/04/21 Nathanial Millman CMA [facility]) - seen for B-12 deficiency. No medication changes. No follow up noted.   Recent consult visits:  02/02/21 Kristeen Miss MD [facility]) - seen for lumbar radiculopathy and cervical radiculopathy. No medication changes or follow up noted.   Hospital visits:  None in previous 6 months.   Medications: Outpatient Encounter Medications as of 02/27/2021  Medication Sig   amoxicillin (AMOXIL) 500 MG tablet Take 4 tablet by mouth   BEFORE DENTAL PROCEDURE.   B-D 3CC LUER-LOK SYR 25GX1" 25G X 1" 3 ML MISC USE AS DIRECTED   Cholecalciferol (VITAMIN D3) 5000 UNITS TABS Take 5,000 tablets by mouth daily.   cyanocobalamin (,VITAMIN B-12,) 1000 MCG/ML injection INJECT 1 ML EVERY 3 WEEKS.   docusate sodium (COLACE) 100 MG capsule Take 1 capsule (100 mg total) by mouth 2 (two) times daily. (Patient taking differently: Take 100 mg by mouth daily.)   etanercept (ENBREL) 50 MG/ML injection Inject 50 mg into the skin once a week. Thursdays   folic acid (FOLVITE) 1 MG tablet Take 1 mg by mouth daily.   HYDROcodone-acetaminophen (NORCO/VICODIN) 5-325 MG tablet Take 1 tablet by mouth every 8 (eight) hours as needed for moderate pain.   irbesartan (AVAPRO) 150 MG tablet Take 1 tablet (150 mg total) by mouth daily.   levothyroxine (SYNTHROID) 50 MCG tablet TAKE 1 TABLET IN THE MORNING ON AN EMPTY STOMACH.   methotrexate (RHEUMATREX) 2.5 MG tablet Take 20 mg by mouth every Monday.    nitrofurantoin (MACRODANTIN) 100 MG capsule Take 100 mg by mouth daily. Urologist.   zoledronic acid (RECLAST) 5 MG/100ML SOLN injection Inject 5 mg into the vein once.   No facility-administered encounter medications on file as of  02/27/2021.   Reviewed chart prior to disease state call. Spoke with patient regarding BP  Recent Office Vitals: BP Readings from Last 3 Encounters:  01/05/21 136/76  09/22/20 130/70  09/19/19 120/74   Pulse Readings from Last 3 Encounters:  09/22/20 88  09/19/19 78  09/04/19 74    Wt Readings from Last 3 Encounters:  09/22/20 147 lb 2 oz (66.7 kg)  09/19/19 155 lb 9.6 oz (70.6 kg)  09/04/19 152 lb 12.8 oz (69.3 kg)     Kidney Function Lab Results  Component Value Date/Time   CREATININE 0.98 09/12/2020 09:35 AM   CREATININE 1.1 05/25/2019 12:00 AM   CREATININE 1.00 08/23/2018 10:03 AM   GFR 53.39 (L) 09/12/2020 09:35 AM   GFRNONAA 47 (L) 11/19/2017 08:09 AM   GFRAA 54 (L) 11/19/2017 08:09 AM    BMP Latest Ref Rng & Units 09/12/2020 05/25/2019 08/23/2018  Glucose 70 - 99 mg/dL 99 - 87  BUN 6 - 23 mg/dL 21 22(A) 15  Creatinine 0.40 - 1.20 mg/dL 0.98 1.1 1.00  Sodium 135 - 145 mEq/L 140 140 138  Potassium 3.5 - 5.1 mEq/L 4.5 4.4 4.7  Chloride 96 - 112 mEq/L 104 104 104  CO2 19 - 32 mEq/L '27 21 26  '$ Calcium 8.4 - 10.5 mg/dL 10.1 9.0 8.9    Current antihypertensive regimen:  Irbesartan '150mg'$  - take 1 tablet by mouth daily. How often are you checking your Blood Pressure? Has not been checking lately. Current home BP readings: none to  report.  What recent interventions/DTPs have been made by any provider to improve Blood Pressure control since last CPP Visit: None. Any recent hospitalizations or ED visits since last visit with CPP? No  Adherence Review: Is the patient currently on ACE/ARB medication? Yes Does the patient have >5 day gap between last estimated fill dates? No  Notes: Raquel Sarna at [facility] verified patients last fill date for irbesartan is 12/08/20  for a 90 day supply.  Spoke with patient and reviewed all medications as prescribed. Patient reports as taking all medications and no issues at this time. Patient has an english muffin for breakfast with a  cup of milk and cup of coffee. Usually a sandwich for lunch and she has vegetables with a meat or a salad for dinner usually. Patient does add salt to her food and due to her incontinence problem she does not drink a lot of fluids like water. Patient has not been checking her blood pressure. I re encouraged patient to check her blood pressure weekly and keep a log. Patient was agreeable. Patient has had an issue with her back so she's not able to walk long distances but she cooks and cleans at home. Patient thanked me for my call.   Care Gaps:  AWV - completed on 01/20/21 Zoster vaccines - never done Flu vaccine - due  Star Rating Drugs:  Irbesartan '150mg'$  - last filled on 12/08/20 90DS at [facility].  [place]  Clinical Pharmacist Assistant [phone]

## 2021-02-28 ENCOUNTER — Other Ambulatory Visit: Payer: Self-pay

## 2021-02-28 ENCOUNTER — Ambulatory Visit
Admission: RE | Admit: 2021-02-28 | Discharge: 2021-02-28 | Disposition: A | Discharge status: Home / Self Care | Payer: Medicare Other | Source: Ambulatory Visit | Attending: Neurosurgery | Admitting: Neurosurgery

## 2021-02-28 DIAGNOSIS — M5412 Radiculopathy, cervical region: Secondary | ICD-10-CM

## 2021-02-28 DIAGNOSIS — M5416 Radiculopathy, lumbar region: Secondary | ICD-10-CM

## 2021-03-05 DIAGNOSIS — S86012D Strain of left Achilles tendon, subsequent encounter: Secondary | ICD-10-CM | POA: Diagnosis not present

## 2021-03-05 DIAGNOSIS — M0589 Other rheumatoid arthritis with rheumatoid factor of multiple sites: Secondary | ICD-10-CM | POA: Diagnosis not present

## 2021-03-05 DIAGNOSIS — Z79899 Other long term (current) drug therapy: Secondary | ICD-10-CM | POA: Diagnosis not present

## 2021-03-05 DIAGNOSIS — M150 Primary generalized (osteo)arthritis: Secondary | ICD-10-CM | POA: Diagnosis not present

## 2021-03-05 DIAGNOSIS — M47812 Spondylosis without myelopathy or radiculopathy, cervical region: Secondary | ICD-10-CM | POA: Diagnosis not present

## 2021-03-05 DIAGNOSIS — M810 Age-related osteoporosis without current pathological fracture: Secondary | ICD-10-CM | POA: Diagnosis not present

## 2021-03-05 DIAGNOSIS — M722 Plantar fascial fibromatosis: Secondary | ICD-10-CM | POA: Diagnosis not present

## 2021-03-05 DIAGNOSIS — G894 Chronic pain syndrome: Secondary | ICD-10-CM | POA: Diagnosis not present

## 2021-03-05 DIAGNOSIS — M25512 Pain in left shoulder: Secondary | ICD-10-CM | POA: Diagnosis not present

## 2021-03-09 ENCOUNTER — Ambulatory Visit (INDEPENDENT_AMBULATORY_CARE_PROVIDER_SITE_OTHER): Payer: Medicare Other

## 2021-03-09 ENCOUNTER — Telehealth: Payer: Self-pay | Admitting: Family Medicine

## 2021-03-09 ENCOUNTER — Other Ambulatory Visit: Payer: Self-pay

## 2021-03-09 DIAGNOSIS — E538 Deficiency of other specified B group vitamins: Secondary | ICD-10-CM | POA: Diagnosis not present

## 2021-03-09 MED ORDER — CYANOCOBALAMIN 1000 MCG/ML IJ SOLN
1000.0000 ug | Freq: Once | INTRAMUSCULAR | Status: AC
  Administered 2021-03-09: 1000 ug via INTRAMUSCULAR

## 2021-03-09 NOTE — Progress Notes (Addendum)
Per orders of Dr. Martinique, injection of B12 given by Rebecca Eaton. Patient tolerated injection well. Betty Martinique, MD

## 2021-03-09 NOTE — Telephone Encounter (Signed)
Please schedule next injection for 4-5 weeks, thank you!

## 2021-03-09 NOTE — Telephone Encounter (Signed)
Patient received a B12 injection this morning and is needing to know when her next injection should be.  Patient's contact number is [phone].  Please advise.

## 2021-03-19 ENCOUNTER — Ambulatory Visit
Admission: RE | Admit: 2021-03-19 | Discharge: 2021-03-19 | Disposition: A | Discharge status: Home / Self Care | Payer: Medicare Other | Source: Ambulatory Visit | Attending: Neurosurgery | Admitting: Neurosurgery

## 2021-03-19 ENCOUNTER — Other Ambulatory Visit: Payer: Self-pay

## 2021-03-19 DIAGNOSIS — M4802 Spinal stenosis, cervical region: Secondary | ICD-10-CM | POA: Diagnosis not present

## 2021-03-19 DIAGNOSIS — M542 Cervicalgia: Secondary | ICD-10-CM | POA: Diagnosis not present

## 2021-03-19 DIAGNOSIS — M48061 Spinal stenosis, lumbar region without neurogenic claudication: Secondary | ICD-10-CM | POA: Diagnosis not present

## 2021-03-19 DIAGNOSIS — M545 Low back pain, unspecified: Secondary | ICD-10-CM | POA: Diagnosis not present

## 2021-03-19 DIAGNOSIS — M5416 Radiculopathy, lumbar region: Secondary | ICD-10-CM

## 2021-03-24 DIAGNOSIS — R350 Frequency of micturition: Secondary | ICD-10-CM | POA: Diagnosis not present

## 2021-04-08 ENCOUNTER — Ambulatory Visit: Payer: Medicare Other

## 2021-04-08 DIAGNOSIS — L814 Other melanin hyperpigmentation: Secondary | ICD-10-CM | POA: Diagnosis not present

## 2021-04-08 DIAGNOSIS — Z85828 Personal history of other malignant neoplasm of skin: Secondary | ICD-10-CM | POA: Diagnosis not present

## 2021-04-08 DIAGNOSIS — L821 Other seborrheic keratosis: Secondary | ICD-10-CM | POA: Diagnosis not present

## 2021-04-08 DIAGNOSIS — D0462 Carcinoma in situ of skin of left upper limb, including shoulder: Secondary | ICD-10-CM | POA: Diagnosis not present

## 2021-04-08 DIAGNOSIS — D485 Neoplasm of uncertain behavior of skin: Secondary | ICD-10-CM | POA: Diagnosis not present

## 2021-04-08 DIAGNOSIS — D225 Melanocytic nevi of trunk: Secondary | ICD-10-CM | POA: Diagnosis not present

## 2021-04-08 DIAGNOSIS — L218 Other seborrheic dermatitis: Secondary | ICD-10-CM | POA: Diagnosis not present

## 2021-04-08 DIAGNOSIS — L918 Other hypertrophic disorders of the skin: Secondary | ICD-10-CM | POA: Diagnosis not present

## 2021-04-08 DIAGNOSIS — D0471 Carcinoma in situ of skin of right lower limb, including hip: Secondary | ICD-10-CM | POA: Diagnosis not present

## 2021-04-08 DIAGNOSIS — L570 Actinic keratosis: Secondary | ICD-10-CM | POA: Diagnosis not present

## 2021-04-10 ENCOUNTER — Other Ambulatory Visit: Payer: Self-pay

## 2021-04-10 ENCOUNTER — Ambulatory Visit (INDEPENDENT_AMBULATORY_CARE_PROVIDER_SITE_OTHER): Payer: Medicare Other

## 2021-04-10 DIAGNOSIS — Z23 Encounter for immunization: Secondary | ICD-10-CM | POA: Diagnosis not present

## 2021-04-10 DIAGNOSIS — E538 Deficiency of other specified B group vitamins: Secondary | ICD-10-CM | POA: Diagnosis not present

## 2021-04-10 MED ORDER — CYANOCOBALAMIN 1000 MCG/ML IJ SOLN
1000.0000 ug | Freq: Once | INTRAMUSCULAR | Status: AC
  Administered 2021-04-10: 1000 ug via INTRAMUSCULAR

## 2021-04-10 NOTE — Progress Notes (Signed)
Per orders of Dr. Volanda Napoleon, injection of B12 given by Rodrigo Ran. Patient tolerated injection well.

## 2021-04-13 DIAGNOSIS — M4125 Other idiopathic scoliosis, thoracolumbar region: Secondary | ICD-10-CM | POA: Diagnosis not present

## 2021-04-13 DIAGNOSIS — R030 Elevated blood-pressure reading, without diagnosis of hypertension: Secondary | ICD-10-CM | POA: Diagnosis not present

## 2021-04-15 DIAGNOSIS — R350 Frequency of micturition: Secondary | ICD-10-CM | POA: Diagnosis not present

## 2021-04-22 DIAGNOSIS — D0462 Carcinoma in situ of skin of left upper limb, including shoulder: Secondary | ICD-10-CM | POA: Diagnosis not present

## 2021-04-22 DIAGNOSIS — M4125 Other idiopathic scoliosis, thoracolumbar region: Secondary | ICD-10-CM | POA: Diagnosis not present

## 2021-04-23 DIAGNOSIS — Z1231 Encounter for screening mammogram for malignant neoplasm of breast: Secondary | ICD-10-CM | POA: Diagnosis not present

## 2021-04-23 LAB — HM MAMMOGRAPHY

## 2021-04-24 ENCOUNTER — Encounter: Payer: Self-pay | Admitting: Family Medicine

## 2021-04-28 DIAGNOSIS — M4125 Other idiopathic scoliosis, thoracolumbar region: Secondary | ICD-10-CM | POA: Diagnosis not present

## 2021-04-30 DIAGNOSIS — M4125 Other idiopathic scoliosis, thoracolumbar region: Secondary | ICD-10-CM | POA: Diagnosis not present

## 2021-05-04 DIAGNOSIS — M4125 Other idiopathic scoliosis, thoracolumbar region: Secondary | ICD-10-CM | POA: Diagnosis not present

## 2021-05-06 DIAGNOSIS — M4125 Other idiopathic scoliosis, thoracolumbar region: Secondary | ICD-10-CM | POA: Diagnosis not present

## 2021-05-11 DIAGNOSIS — M4125 Other idiopathic scoliosis, thoracolumbar region: Secondary | ICD-10-CM | POA: Diagnosis not present

## 2021-05-12 ENCOUNTER — Telehealth: Payer: Self-pay

## 2021-05-12 ENCOUNTER — Ambulatory Visit (INDEPENDENT_AMBULATORY_CARE_PROVIDER_SITE_OTHER): Payer: Medicare Other

## 2021-05-12 DIAGNOSIS — E538 Deficiency of other specified B group vitamins: Secondary | ICD-10-CM | POA: Diagnosis not present

## 2021-05-12 MED ORDER — CYANOCOBALAMIN 1000 MCG/ML IJ SOLN
1000.0000 ug | INTRAMUSCULAR | Status: AC
  Administered 2021-05-12 – 2021-10-09 (×4): 1000 ug via INTRAMUSCULAR

## 2021-05-12 NOTE — Telephone Encounter (Signed)
Patient called asking why didn't she receive a call to schedule her B-12 injection and would like a call back to discuss her future B-12 injections

## 2021-05-12 NOTE — Progress Notes (Signed)
Pt here for monthly B12 injection per Dr Martinique.  B12 1034mcg given IM in right deltoid and pt tolerated injection well.  Next B12 injection scheduled for 06/11/21.

## 2021-05-12 NOTE — Telephone Encounter (Signed)
I called and spoke with patient. We set up her November B12 for today and scheduled her December B12 as well. I advised her she can get a print out from the front desk for her December appointment. Patient verbalized understanding.

## 2021-05-14 DIAGNOSIS — M4125 Other idiopathic scoliosis, thoracolumbar region: Secondary | ICD-10-CM | POA: Diagnosis not present

## 2021-05-19 DIAGNOSIS — M4125 Other idiopathic scoliosis, thoracolumbar region: Secondary | ICD-10-CM | POA: Diagnosis not present

## 2021-05-21 DIAGNOSIS — M4125 Other idiopathic scoliosis, thoracolumbar region: Secondary | ICD-10-CM | POA: Diagnosis not present

## 2021-06-02 DIAGNOSIS — R350 Frequency of micturition: Secondary | ICD-10-CM | POA: Diagnosis not present

## 2021-06-11 ENCOUNTER — Ambulatory Visit (INDEPENDENT_AMBULATORY_CARE_PROVIDER_SITE_OTHER): Payer: Medicare Other

## 2021-06-11 DIAGNOSIS — E538 Deficiency of other specified B group vitamins: Secondary | ICD-10-CM

## 2021-06-11 NOTE — Progress Notes (Signed)
Pt here for monthly B12 injection per Dr Martinique.  B12 1052mcg given IM left deltoid and pt tolerated injection well.  Next B12 injection scheduled for 07/13/21.  Dr Jerilee Hoh: Please cosign since PCP is out of office. Thank you!

## 2021-06-15 DIAGNOSIS — H40023 Open angle with borderline findings, high risk, bilateral: Secondary | ICD-10-CM | POA: Diagnosis not present

## 2021-06-15 DIAGNOSIS — H26493 Other secondary cataract, bilateral: Secondary | ICD-10-CM | POA: Diagnosis not present

## 2021-06-15 DIAGNOSIS — H5203 Hypermetropia, bilateral: Secondary | ICD-10-CM | POA: Diagnosis not present

## 2021-06-17 DIAGNOSIS — M4125 Other idiopathic scoliosis, thoracolumbar region: Secondary | ICD-10-CM | POA: Diagnosis not present

## 2021-07-01 DIAGNOSIS — G894 Chronic pain syndrome: Secondary | ICD-10-CM | POA: Diagnosis not present

## 2021-07-01 DIAGNOSIS — Z79899 Other long term (current) drug therapy: Secondary | ICD-10-CM | POA: Diagnosis not present

## 2021-07-01 DIAGNOSIS — M47812 Spondylosis without myelopathy or radiculopathy, cervical region: Secondary | ICD-10-CM | POA: Diagnosis not present

## 2021-07-01 DIAGNOSIS — M0589 Other rheumatoid arthritis with rheumatoid factor of multiple sites: Secondary | ICD-10-CM | POA: Diagnosis not present

## 2021-07-01 DIAGNOSIS — M150 Primary generalized (osteo)arthritis: Secondary | ICD-10-CM | POA: Diagnosis not present

## 2021-07-01 DIAGNOSIS — M722 Plantar fascial fibromatosis: Secondary | ICD-10-CM | POA: Diagnosis not present

## 2021-07-01 DIAGNOSIS — S86012D Strain of left Achilles tendon, subsequent encounter: Secondary | ICD-10-CM | POA: Diagnosis not present

## 2021-07-01 DIAGNOSIS — M25512 Pain in left shoulder: Secondary | ICD-10-CM | POA: Diagnosis not present

## 2021-07-01 DIAGNOSIS — M810 Age-related osteoporosis without current pathological fracture: Secondary | ICD-10-CM | POA: Diagnosis not present

## 2021-07-03 ENCOUNTER — Telehealth: Payer: Self-pay | Admitting: Pharmacist

## 2021-07-03 NOTE — Chronic Care Management (AMB) (Signed)
° ° °  Chronic Care Management Pharmacy Assistant   Name: Jennifer Goodman  MRN: [id number] DOB: 1934/10/26  07/07/2021 APPOINTMENT REMINDER  Jennifer Goodman was reminded to have all medications, supplements and any blood glucose and blood pressure readings available for review with Jennifer Goodman, Pharm. D, at her telephone visit on 07/07/2021 at 12:00.   Questions: Have you had any recent office visit or specialist visit outside of [facility]?  Patient denies any visits outside of [facility]  Are there any concerns you would like to discuss during your office visit?  Patient denies any concerns at this time  Are you having any problems obtaining your medications? (Whether it pharmacy issues or cost)  Patient denies any issues getting her medications except with Enbrel (see PAP note below)  If patient has any PAP medications ask if they are having any problems getting their PAP medication or refill?  Patient is getting Enbrel through her rheumatologist, she gets coupon card for this. She only had trouble when her insurance changes.   Care Gaps: AWV - completed on 01/20/21 Last BP - 130/70 on 09/22/2020 Zoster vaccines - never done Pneumonia vaccine - over due  Star Rating Drug: Irbesartan 150mg  - last filled on 06/26/2021 90DS at [facility] verified with Jennifer Goodman  Any gaps in medications fill history? No  Jennifer Goodman [facility]  Catering manager [phone]

## 2021-07-07 ENCOUNTER — Ambulatory Visit (INDEPENDENT_AMBULATORY_CARE_PROVIDER_SITE_OTHER): Payer: Medicare Other | Admitting: Pharmacist

## 2021-07-07 DIAGNOSIS — I10 Essential (primary) hypertension: Secondary | ICD-10-CM

## 2021-07-07 DIAGNOSIS — E039 Hypothyroidism, unspecified: Secondary | ICD-10-CM

## 2021-07-07 NOTE — Progress Notes (Signed)
Chronic Care Management Pharmacy Note  07/07/2021 Name:  Jennifer Goodman MRN:  [id number] DOB:  1934/09/04  Summary: BP is at goal < 140/90 per office and home readings  Recommendations/Changes made from today's visit: -Recommended routine monitoring of BP at home -Recommend repeat vitamin D level with upcoming labs -Recommended bringing BP cuff to next office visit to ensure accuracy   Plan: Follow up BP assessment in 3 months  Subjective: Jennifer Goodman is an 87 y.o. year old female who is a primary patient of Martinique, Malka So, MD.  The CCM team was consulted for assistance with disease management and care coordination needs.    Engaged with patient by telephone for follow up visit in response to provider referral for pharmacy case management and/or care coordination services.   Consent to Services:  The patient was given information about Chronic Care Management services, agreed to services, and gave verbal consent prior to initiation of services.  Please see initial visit note for detailed documentation.   Patient Care Team: Martinique, Betty G, MD as PCP - General (Family Medicine) Juanita Craver, MD as Attending Physician (Gastroenterology) Viona Gilmore, [facility] as Pharmacist (Pharmacist)  Recent office visits: 06/11/21 Elza Rafter, RN: Patient presented for vitamin B12 injection.  04/10/21 Nathanial Millman, CMA: Patient presented for influenza vaccine administration.  01/20/21 Randel Pigg, LPN: Patient presented for AWV.  09/22/20 Betty Martinique MD (PCP) - seen for routine general medical exam and other chronic conditions. Discontinued hydrocodone-acetaminophen 5-225m. Patient received a cyanobalamin B-12 injection of 100038m in office. Follow up in 1 year. 5-6 weeks for B-12 injection, lab in 3-4 months.   Recent consult visits: 05/25/21 JaLeigh Aurorarheumatology): Unable to access notes.  04/01/21 JaLeigh Aurorarheumatology): Unable to access notes.  03/05/21 JaLeigh Aurora(rheumatology): Unable to access notes.  02/02/21 [facility] - seen for lumbar radiculopathy and cervical radiculopathy. No medication changes or follow up noted.   Hospital visits: None in previous 6 months   Objective:  Lab Results  Component Value Date   CREATININE 0.98 09/12/2020   BUN 21 09/12/2020   GFR 53.39 (L) 09/12/2020   GFRNONAA 47 (L) 11/19/2017   GFRAA 54 (L) 11/19/2017   NA 140 09/12/2020   K 4.5 09/12/2020   CALCIUM 10.1 09/12/2020   CO2 27 09/12/2020   GLUCOSE 99 09/12/2020    Lab Results  Component Value Date/Time   GFR 53.39 (L) 09/12/2020 09:35 AM   GFR 53.16 (L) 08/23/2018 10:03 AM   MICROALBUR 1.0 08/23/2018 10:03 AM   MICROALBUR 1.4 08/24/2017 11:20 AM    Last diabetic Eye exam: No results found for: HMDIABEYEEXA  Last diabetic Foot exam: No results found for: HMDIABFOOTEX   Lab Results  Component Value Date   CHOL 204 (H) 09/12/2020   HDL 106.40 09/12/2020   LDLCALC 85 09/12/2020   TRIG 64.0 09/12/2020   CHOLHDL 2 09/12/2020    Hepatic Function Latest Ref Rng & Units 05/25/2019 11/16/2017 05/18/2016  Total Protein 6.5 - 8.1 g/dL - 6.0(L) 6.4(L)  Albumin 3.5 - 5.0 4.0 3.7 4.0  AST 13 - 35 33 29 32  ALT 7 - 35 31 18 19   Alk Phosphatase 25 - 125 63 62 60  Total Bilirubin 0.3 - 1.2 mg/dL - 1.6(H) 1.3(H)    Lab Results  Component Value Date/Time   TSH 2.77 09/12/2020 09:35 AM   TSH 2.54 09/04/2019 12:46 PM    CBC Latest Ref Rng &  Units 09/12/2020 05/25/2019 11/19/2017  WBC 4.0 - 10.5 K/uL 6.8 7.0 8.1  Hemoglobin 12.0 - 15.0 g/dL 11.6(L) 11.6(A) 10.9(L)  Hematocrit 36.0 - 46.0 % 35.8(L) 34(A) 35.1(L)  Platelets 150.0 - 400.0 K/uL 247.0 310 212    Lab Results  Component Value Date/Time   VD25OH 89.52 02/24/2018 11:31 AM    Clinical ASCVD: No  The ASCVD Risk score (Arnett DK, et al., 2019) failed to calculate for the following reasons:   The 2019 ASCVD risk score is only valid for ages 49 to 21     Depression screen PHQ 2/9 01/20/2021 01/20/2021 09/22/2020  Decreased Interest 0 0 0  Down, Depressed, Hopeless 0 0 0  PHQ - 2 Score 0 0 0     Social History   Tobacco Use  Smoking Status Former   Types: Cigarettes   Quit date: 06/14/1978   Years since quitting: 43.0  Smokeless Tobacco Never  Tobacco Comments   quit 32 yrs ago   BP Readings from Last 3 Encounters:  01/05/21 136/76  09/22/20 130/70  09/19/19 120/74   Pulse Readings from Last 3 Encounters:  09/22/20 88  09/19/19 78  09/04/19 74   Wt Readings from Last 3 Encounters:  09/22/20 147 lb 2 oz (66.7 kg)  09/19/19 155 lb 9.6 oz (70.6 kg)  09/04/19 152 lb 12.8 oz (69.3 kg)   BMI Readings from Last 3 Encounters:  09/22/20 25.25 kg/m  09/19/19 26.71 kg/m  09/04/19 26.23 kg/m    Assessment/Interventions: Review of patient past medical history, allergies, medications, health status, including review of consultants reports, laboratory and other test data, was performed as part of comprehensive evaluation and provision of chronic care management services.   SDOH:  (Social Determinants of Health) assessments and interventions performed: Yes   SDOH Screenings   Alcohol Screen: Not on file  Depression (PHQ2-9): Low Risk    PHQ-2 Score: 0  Financial Resource Strain: Low Risk    Difficulty of Paying Living Expenses: Not hard at all  Food Insecurity: No Food Insecurity   Worried About Charity fundraiser in the Last Year: Never true   Ran Out of Food in the Last Year: Never true  Housing: Low Risk    Last Housing Risk Score: 0  Physical Activity: Inactive   Days of Exercise per Week: 0 days   Minutes of Exercise per Session: 0 min  Social Connections: Moderately Isolated   Frequency of Communication with Friends and Family: Three times a week   Frequency of Social Gatherings with Friends and Family: Three times a week   Attends Religious Services: Never   Active Member of Clubs or Organizations: No   Attends  Archivist Meetings: Never   Marital Status: Married  Stress: No Stress Concern Present   Feeling of Stress : Only a little  Tobacco Use: Medium Risk   Smoking Tobacco Use: Former   Smokeless Tobacco Use: Never   Passive Exposure: Not on file  Transportation Needs: No Transportation Needs   Lack of Transportation (Medical): No   Lack of Transportation (Non-Medical): No   CCM Care Plan  Allergies  Allergen Reactions   Oxycodone Other (See Comments)    Makes hallucinates   Sulfamethoxazole Nausea Only    Medications Reviewed Today     Reviewed by Viona Gilmore, [facility] (Pharmacist) on 07/07/21 at 1206  Med List Status: <None>   Medication Order Taking? Sig Documenting Provider Last Dose Status Informant  amoxicillin (AMOXIL) 500 MG  tablet [id number]  Take 4 tablet by mouth   BEFORE DENTAL PROCEDURE. [provider]  Active   B-D 3CC LUER-LOK SYR 25GX1" 25G X 1" 3 ML MISC [id number]  USE AS DIRECTED Martinique, Betty G, MD  Active   Cholecalciferol (VITAMIN D3) 5000 UNITS TABS [id number]  Take 5,000 tablets by mouth daily. [provider]  Active Self  cyanocobalamin ((VITAMIN B-12)) injection 1,000 mcg [id number]   Martinique, Betty G, MD  Active   cyanocobalamin (,VITAMIN B-12,) 1000 MCG/ML injection [id number]  INJECT 1 ML EVERY 3 WEEKS. Martinique, Betty G, MD  Active   docusate sodium (COLACE) 100 MG capsule [id number]  Take 1 capsule (100 mg total) by mouth 2 (two) times daily.  Patient taking differently: Take 100 mg by mouth daily.   Regalado, Belkys A, MD  Active   etanercept (ENBREL) 50 MG/ML injection [id number]  Inject 50 mg into the skin once a week. Thursdays [provider]  Active Self           Med Note Leslie Dales, ANNA E   Tue Nov 15, 537  7:67 PM)    folic acid (FOLVITE) 1 MG tablet [id number]  Take 1 mg by mouth daily. [provider]  Active Self  HYDROcodone-acetaminophen (NORCO/VICODIN) 5-325 MG tablet [id number] Yes Take 1 tablet by  mouth every 8 (eight) hours as needed for moderate pain. [provider] Taking Active   irbesartan (AVAPRO) 150 MG tablet [id number]  Take 1 tablet (150 mg total) by mouth daily. Martinique, Betty G, MD  Active   levothyroxine (SYNTHROID) 50 MCG tablet [id number]  TAKE 1 TABLET IN THE MORNING ON AN EMPTY STOMACH. Martinique, Betty G, MD  Active   methotrexate [facility]) 2.5 MG tablet [id number]  Take 20 mg by mouth every Monday.  [provider]  Active Self           Med Note Leslie Dales, ANNA E   Tue Nov 15, 2017  9:10 PM)    nitrofurantoin (MACRODANTIN) 100 MG capsule [id number]  Take 100 mg by mouth daily. Urologist. [provider]  Active   zoledronic acid (RECLAST) 5 MG/100ML SOLN injection [id number]  Inject 5 mg into the vein once. [provider]  Active             Patient Active Problem List   Diagnosis Date Noted   Hypercholesterolemia 02/24/2018   Paroxysmal atrial fibrillation ([place])    Fall 11/15/2017   Displaced fracture of fifth metatarsal bone, left foot, initial encounter for closed fracture 11/15/2017   Achilles tendon avulsion 11/15/2017   Fracture, foot, left, closed, initial encounter 11/15/2017   Hypertension, essential, benign 08/24/2017   Hypothyroidism (acquired) 08/24/2017   CKD (chronic kidney disease), stage III ([place]) 08/24/2017   Infection of urinary tract 05/29/2016   Primary osteoarthritis of right knee 05/28/2016   Primary osteoarthritis of left knee 06/20/2015   Osteoporosis 06/20/2015   Closed displaced fracture of medial condyle of left femur ([place]) 06/20/2015   Abdominal pain, RLQ (right lower quadrant) 05/19/2012    Immunization History  Administered Date(s) Administered   Fluad Quad(high Dose 65+) 04/11/2019, 04/23/2020, 04/10/2021   Influenza, High Dose Seasonal PF 02/24/2018   PFIZER(Purple Top)SARS-COV-2 Vaccination 07/29/2019, 08/21/2019, 03/03/2020, 10/28/2020   Pfizer Covid-19 Vaccine Bivalent Booster 84yr & up  04/07/2021   Pneumococcal Polysaccharide-23 12/21/2008   Patient is still having back and neck problems. She went to PT and this didn't help with her symptoms and went for 2 months. Patient has  been trying to do the PT exercises at home some but its sporadic.   Patient is going to follow up on March the 1st with neurosurgeon. Patient was hoping to do injections again. Patient reports sitting down helps some and she can't sit down all day.   Conditions to be addressed/monitored:  Hypertension, Chronic Kidney Disease, Hypothyroidism, Osteoporosis, Osteoarthritis, and Vitamin B12 deficiency and Rheumatoid arthritis   Conditions addressed this visit: Osteoarthritis, hypertension  Care Plan : CCM Pharmacy Care Plan  Updates made by Viona Gilmore, [place] since 07/07/2021 12:00 AM     Problem: Problem: Hypertension, Chronic Kidney Disease, Hypothyroidism, Osteopenia, Osteoarthritis, and Vitamin B12 deficiency and Rheumatoid arthritis      Long-Range Goal: Patient-Specific Goal   Start Date: 01/05/2021  Expected End Date: 01/05/2022  Recent Progress: On track  Priority: High  Note:   Current Barriers:  Unable to independently monitor therapeutic efficacy  Pharmacist Clinical Goal(s):  Patient will achieve adherence to monitoring guidelines and medication adherence to achieve therapeutic efficacy through collaboration with PharmD and provider.   Interventions: 1:1 collaboration with Martinique, Betty G, MD regarding development and update of comprehensive plan of care as evidenced by provider attestation and co-signature Inter-disciplinary care team collaboration (see longitudinal plan of care) Comprehensive medication review performed; medication list updated in electronic medical record  Hypertension (BP goal <140/90) -Controlled -Current treatment: Irbesartan 150 mg 1 tablet daily - in AM - Appropriate, Effective, Safe, Accessible -Medications previously tried: valsartan  -Current home  readings: 138/62, usually in the 106-130s (purchased an arm machine) -Current dietary habits: doesn't pay attention to salt intake; uses lower sodium salt -Current exercise habits: walking some -Denies hypotensive/hypertensive symptoms -Educated on BP goals and benefits of medications for prevention of heart attack, stroke and kidney damage; Exercise goal of 150 minutes per week; Importance of home blood pressure monitoring; Proper BP monitoring technique; -Counseled to monitor BP at home weekly, document, and provide log at future appointments -Counseled on diet and exercise extensively Recommended to continue current medication  Osteopenia (Goal prevent fractures) -Controlled -Last DEXA Scan: 03/29/2018   T-Score femoral neck: -1.9  T-Score total hip: n/a  T-Score lumbar spine: n/a  T-Score forearm radius: -1.7  10-year probability of major osteoporotic fracture: 17.2%  10-year probability of hip fracture: 4.5% -Patient is a candidate for pharmacologic treatment due to T-Score -1.0 to -2.5 and 10-year risk of hip fracture > 3% -Current treatment  Reclast 5 mg inject yearly  (injection in July 2022) - Appropriate, Effective, Safe, Accessible  Vitamin D 5000 units daily - Appropriate, Effective, Query Safe, Accessible Calcium carbonate 600 mg - Appropriate, Effective, Safe, Accessible -Medications previously tried: Alendronate (trouble with timing) -Recommend [id number] units of vitamin D daily. Recommend 1200 mg of calcium daily from dietary and supplemental sources. Recommend weight-bearing and muscle strengthening exercises for building and maintaining bone density. -Recommended to continue current medication Recommended for patient to add more calcium to her diet as she prefers this route than additional supplementation.  Rheumatoid arthritis (Goal: minimize pain) -Controlled -Current treatment  Methotrexate 2.5 mg 20 mg every Monday - Appropriate, Effective, Safe,  Accessible Folic acid 1 mg  1 tablet daily - Appropriate, Effective, Safe, Accessible Enbrel 50 mg/mL inject once weekly - Appropriate, Effective, Safe, Accessible -Medications previously tried: n/a  -Recommended to continue current medication Patient is receiving patient assistance for Enbrel.  Pain (Goal: minimize pain) -Controlled -Current treatment  Hydrocodone-APAP 5-325 mg 1 tablet in morning and 1 tablet at night -Medications previously  tried: n/a  -Recommended to continue current medication  Hypothyroidism (Goal: TSH 2.5-3.5 - given concurrent osteopenia) -Controlled -Current treatment  Levothyroxine 50 mcg 1 tablet daily -Medications previously tried: none  -Recommended to continue current medication Counseled on importance of taking on an empty stomach prior to food.  Vitamin B12 deficiency (Goal: 211-911) -Controlled -Current treatment  Vitamin B12 Inject 1000 mcg every 4 weeks -Medications previously tried: none  -Recommended to continue current medication Counseled on possible supplementation with sublingual tablets if patient does not continue with injections.   Health Maintenance -Vaccine gaps: shingrix (rhuematologist said no), COVID booster -Current therapy:  Amoxicillin 500 mg prior to dental procedure Docusate 100 mg 1 capsule once daily -Educated on Cost vs benefit of each product must be carefully weighed by individual consumer -Patient is satisfied with current therapy and denies issues -Recommended to continue current medication  Patient Goals/Self-Care Activities Patient will:  - take medications as prescribed check blood pressure weekly, document, and provide at future appointments target a minimum of 150 minutes of moderate intensity exercise weekly  Follow Up Plan: Telephone follow up appointment with care management team member scheduled for: 6 months        Medication Assistance:  Enbrel obtained through [place] medication assistance  program.  Enrollment ends summer 2023   Compliance/Adherence/Medication fill history: Care Gaps: Shingrix, PCV20  Star-Rating Drugs: Irbesartan 128m - [facility] [id number]: [phone]: [phone]  Uses pill box? Yes Pt endorses 100% compliance - never misses one  We discussed: Current pharmacy is preferred with insurance plan and patient is satisfied with pharmacy services Patient decided to: Continue current medication management strategy  Care Plan and Follow Up Patient Decision:  Patient agrees to Care Plan and Follow-up.  Plan: Telephone follow up appointment with care management team member scheduled for:  6 months  MJeni Salles PharmD, BCentral CityPharmacist LKearnyat BCoon [phone]

## 2021-07-08 DIAGNOSIS — D0462 Carcinoma in situ of skin of left upper limb, including shoulder: Secondary | ICD-10-CM | POA: Diagnosis not present

## 2021-07-13 ENCOUNTER — Ambulatory Visit: Payer: Medicare Other

## 2021-07-13 ENCOUNTER — Ambulatory Visit (INDEPENDENT_AMBULATORY_CARE_PROVIDER_SITE_OTHER): Payer: Medicare Other

## 2021-07-13 DIAGNOSIS — E538 Deficiency of other specified B group vitamins: Secondary | ICD-10-CM | POA: Diagnosis not present

## 2021-07-13 MED ORDER — CYANOCOBALAMIN 1000 MCG/ML IJ SOLN
1000.0000 ug | Freq: Once | INTRAMUSCULAR | Status: AC
  Administered 2021-07-13: 1000 ug via INTRAMUSCULAR

## 2021-07-13 NOTE — Progress Notes (Signed)
Pt here for monthly B12 injection per Dr. Volanda Napoleon.  B12 1020mcg given IM, and pt tolerated injection well.  Next B12 injection scheduled for 08/11/21.

## 2021-07-14 DIAGNOSIS — I10 Essential (primary) hypertension: Secondary | ICD-10-CM | POA: Diagnosis not present

## 2021-07-14 DIAGNOSIS — E039 Hypothyroidism, unspecified: Secondary | ICD-10-CM

## 2021-08-11 ENCOUNTER — Ambulatory Visit (INDEPENDENT_AMBULATORY_CARE_PROVIDER_SITE_OTHER): Payer: Medicare Other

## 2021-08-11 DIAGNOSIS — E538 Deficiency of other specified B group vitamins: Secondary | ICD-10-CM

## 2021-08-11 DIAGNOSIS — I10 Essential (primary) hypertension: Secondary | ICD-10-CM

## 2021-08-11 NOTE — Progress Notes (Signed)
Pt here for monthly B12 injection per Dr Martinique.  B12 1022mcg given IM left deltoid and pt tolerated injection well.  Next B12 injection scheduled for 09/08/21.  Pt also requests to compare her home BP cuff to a manual blood pressure done at this visit. Verbal order received from Dr Martinique to do a BP check.   Home BP cuff reads 160/78. Pt states her ranges at home "kinda vary" & states her systolic is typically in 920F. Manual BP 138/70.  Pt counseled to ensure that her cuff is in the appropriate spot (she didn't take her shirt off). Dr Martinique aware of above & orders received to discharge pt home. Pt has OV with PCP on 09/23/21 & can keep appt as scheduled.

## 2021-08-18 ENCOUNTER — Encounter: Payer: Self-pay | Admitting: Family Medicine

## 2021-08-18 ENCOUNTER — Ambulatory Visit (INDEPENDENT_AMBULATORY_CARE_PROVIDER_SITE_OTHER): Payer: Medicare Other | Admitting: Family Medicine

## 2021-08-18 VITALS — BP 138/80 | HR 88 | Resp 16 | Ht 64.0 in | Wt 145.5 lb

## 2021-08-18 DIAGNOSIS — W19XXXA Unspecified fall, initial encounter: Secondary | ICD-10-CM | POA: Diagnosis not present

## 2021-08-18 DIAGNOSIS — L219 Seborrheic dermatitis, unspecified: Secondary | ICD-10-CM

## 2021-08-18 DIAGNOSIS — L03115 Cellulitis of right lower limb: Secondary | ICD-10-CM | POA: Diagnosis not present

## 2021-08-18 MED ORDER — CLOTRIMAZOLE-BETAMETHASONE 1-0.05 % EX CREA: 1.0000 "application " | TOPICAL_CREAM | Freq: Every day | CUTANEOUS | 1 refills | Status: DC

## 2021-08-18 MED ORDER — DOXYCYCLINE HYCLATE 100 MG PO TABS: 100.0000 mg | ORAL_TABLET | Freq: Two times a day (BID) | ORAL | 0 refills | Status: AC

## 2021-08-18 NOTE — Progress Notes (Signed)
ACUTE VISIT Chief Complaint  Patient presents with   Rash    On face, started 2/17. Itchy, no new detergents, soaps, or foods.    HPI: [name] is a 87 y.o. female with hx of hypothyroidism,paroxysmal atrial fib,CKD III,RA, and unstable gait here today complaining of pruritic erythematous rash on face as described above.  Rash This is a new problem. The current episode started 1 to 4 weeks ago. The problem has been gradually worsening since onset. The affected locations include the face and right ear. The rash is characterized by itchiness. She was exposed to nothing. Pertinent negatives include no anorexia, congestion, cough, diarrhea, eye pain, facial edema, fever, nail changes, rhinorrhea, shortness of breath, sore throat or vomiting. Past treatments include topical steroids. The treatment provided mild relief.  Forehead,nasolabial folds, and around ears. No scalp lesions. Negative for new medication, detergent, soap, or body product. No known insect bite or outdoor exposures to plants. No sick contact. OTC medication for this problem: Hydrocortisone 1%, which helped some. Apple cider vineger helped with pruritus.  She is also reporting a fall while walking on the street down hill, 08/11/21. She stood up for long before started walking and was walking "fast", she thinks her legs were tired. No head trauma or LOC.  Landed on right side in between sidewalk and grass.She was able to get up with no help and continue walking home. She has small excoriation on right knee and right elbow. She had some right knee pain after fall, has improved. Keeping area with a bandage on because it is irritated by rubbing with pants.  She uses a cane.  Review of Systems  Constitutional:  Negative for appetite change, chills and fever.  HENT:  Negative for congestion, rhinorrhea and sore throat.   Eyes:  Negative for pain.  Respiratory:  Negative for cough, shortness of breath and wheezing.    Cardiovascular:  Negative for chest pain and palpitations.  Gastrointestinal:  Negative for anorexia, diarrhea and vomiting.  Genitourinary:  Negative for decreased urine volume, dysuria and hematuria.  Skin:  Positive for rash. Negative for nail changes.  Neurological:  Negative for syncope and headaches.  Rest see pertinent positives and negatives per HPI.  Current Outpatient Medications on File Prior to Visit  Medication Sig Dispense Refill   amoxicillin (AMOXIL) 500 MG tablet Take 4 tablet by mouth   BEFORE DENTAL PROCEDURE.     Cholecalciferol (VITAMIN D3) 5000 UNITS TABS Take 5,000 tablets by mouth daily.     cyanocobalamin (,VITAMIN B-12,) 1000 MCG/ML injection INJECT 1 ML EVERY 3 WEEKS. (Patient taking differently: Inject 1,000 mcg into the muscle. Every 4 weeks.) 4 mL 0   docusate sodium (COLACE) 100 MG capsule Take 1 capsule (100 mg total) by mouth 2 (two) times daily. (Patient taking differently: Take 100 mg by mouth daily.) 10 capsule 0   etanercept (ENBREL) 50 MG/ML injection Inject 50 mg into the skin once a week. Thursdays     folic acid (FOLVITE) 1 MG tablet Take 1 mg by mouth daily.     HYDROcodone-acetaminophen (NORCO/VICODIN) 5-325 MG tablet Take 1 tablet by mouth every 8 (eight) hours as needed for moderate pain.     irbesartan (AVAPRO) 150 MG tablet Take 1 tablet (150 mg total) by mouth daily. 90 tablet 3   levothyroxine (SYNTHROID) 50 MCG tablet TAKE 1 TABLET IN THE MORNING ON AN EMPTY STOMACH. 90 tablet 3   methotrexate (RHEUMATREX) 2.5 MG tablet Take 20 mg by  mouth every Monday.      nitrofurantoin (MACRODANTIN) 100 MG capsule Take 100 mg by mouth daily. Urologist.     zoledronic acid (RECLAST) 5 MG/100ML SOLN injection Inject 5 mg into the vein once.     Current Facility-Administered Medications on File Prior to Visit  Medication Dose Route Frequency Provider Last Rate Last Admin   cyanocobalamin ((VITAMIN B-12)) injection 1,000 mcg  1,000 mcg Intramuscular Q30 days  Martinique, Naylani Bradner G, MD   1,000 mcg at 08/11/21 1025   Past Medical History:  Diagnosis Date   Anemia    Arthritis    Rhuematoid-takes Enbrel and Methorexate    Back pain    states in Jan 2017 spinal was attempted 4 times and has had back pain since.   Cancer ([place])    skin cancer on head   Chicken pox    Diverticulosis    History of colon polyps    benign   Hypertension    takes Avapro daily   Hypothyroidism    takes Synthroid daily   Joint pain    Pneumonia    at age 68   PONV (postoperative nausea and vomiting)    the patch helps!   Urinary frequency    Urinary urgency    was taking Myrbetriq but stopped.Plans to start back   Allergies  Allergen Reactions   Oxycodone Other (See Comments)    Makes hallucinates   Sulfamethoxazole Nausea Only    Social History   Socioeconomic History   Marital status: Married    Spouse name: Not on file   Number of children: 2   Years of education: Not on file   Highest education level: Not on file  Occupational History   Occupation: Retired  Tobacco Use   Smoking status: Former    Types: Cigarettes    Quit date: 06/14/1978    Years since quitting: 43.2   Smokeless tobacco: Never   Tobacco comments:    quit 32 yrs ago  Vaping Use   Vaping Use: Never used  Substance and Sexual Activity   Alcohol use: Yes    Comment: occ   Drug use: No   Sexual activity: Never  Other Topics Concern   Not on file  Social History Narrative   She lives in [place] with her husband.   Social Determinants of Health   Financial Resource Strain: Low Risk    Difficulty of Paying Living Expenses: Not hard at all  Food Insecurity: No Food Insecurity   Worried About Charity fundraiser in the Last Year: Never true   [place] in the Last Year: Never true  Transportation Needs: No Transportation Needs   Lack of Transportation (Medical): No   Lack of Transportation (Non-Medical): No  Physical Activity: Inactive   Days of Exercise per  Week: 0 days   Minutes of Exercise per Session: 0 min  Stress: No Stress Concern Present   Feeling of Stress : Only a little  Social Connections: Moderately Isolated   Frequency of Communication with Friends and Family: Three times a week   Frequency of Social Gatherings with Friends and Family: Three times a week   Attends Religious Services: Never   Active Member of Clubs or Organizations: No   Attends Archivist Meetings: Never   Marital Status: Married    Vitals:   08/18/21 1549  BP: 138/80  Pulse: 88  Resp: 16  SpO2: 98%   Body mass index is 24.98 kg/m.  Physical Exam Vitals and nursing note reviewed.  Constitutional:      General: She is not in acute distress.    Appearance: She is well-developed.  HENT:     Head: Normocephalic and atraumatic.     Mouth/Throat:     Mouth: Mucous membranes are moist.     Pharynx: Oropharynx is clear.  Eyes:     Conjunctiva/sclera: Conjunctivae normal.  Cardiovascular:     Rate and Rhythm: Normal rate and regular rhythm.  Pulmonary:     Effort: Pulmonary effort is normal. No respiratory distress.     Breath sounds: Normal breath sounds.  Abdominal:     Palpations: Abdomen is soft. There is no mass.     Tenderness: There is no abdominal tenderness.  Lymphadenopathy:     Cervical: No cervical adenopathy.  Skin:    General: Skin is warm.     Findings: Rash present.     Comments: Superficial excoriation right knee (2 cm) with surrounding erythema and local heat. Right elbow excoriation with brown crust. On face macular erythematous lesions scattered on forehead,right ear, around nasolabial fold and under left labial commissure.  Neurological:     General: No focal deficit present.     Mental Status: She is alert and oriented to person, place, and time.     Comments: Mildly unstable gait assisted with a cane.  Psychiatric:     Comments: Well groomed, good eye contact.   ASSESSMENT AND PLAN:  [name] was seen today  for rash.  Diagnoses and all orders for this visit:  Fall, initial encounter Fall precautions discussed. Not interested in a walker. I do not think imaging is needed at this time.  Cellulitis of knee, right Mild. Some side effects of oral abx discussed. Keep area clean with soap and water, uncovered during the day if possible. Instructed about warning signs.  -     doxycycline (VIBRA-TABS) 100 MG tablet; Take 1 tablet (100 mg total) by mouth 2 (two) times daily for 7 days.  Dermatitis, seborrheic We discussed differential dx. Recommend topical Lotrisone, small amount at the time, bid x 14 days then prn. She follows with dermatologist regularly, instructed to arrange f/u appt if rash is not greatly improved.  -     clotrimazole-betamethasone (LOTRISONE) cream; Apply 1 application. topically daily.  Return if symptoms worsen or fail to improve.  Texie Tupou G. Martinique, MD  [facility]. [place] office.

## 2021-08-18 NOTE — Patient Instructions (Signed)
A few things to remember from today's visit: ? ? ?Cellulitis of knee, right - Plan: doxycycline (VIBRA-TABS) 100 MG tablet ? ?Fall, initial encounter ? ?Dermatitis, seborrheic - Plan: clotrimazole-betamethasone (LOTRISONE) cream ? ?If you need refills please call your pharmacy. ?Do not use My Chart to request refills or for acute issues that need immediate attention. ?  ?Keep knee wound clean with soap and water. ?Keep I uncovered during the day. ?Follow with your dermatologist if facial rash is not better. ? ?Please be sure medication list is accurate. ?If a new problem present, please set up appointment sooner than planned today. ? ?Seborrheic Dermatitis, Adult ?Seborrheic dermatitis is a skin disease that causes red, scaly patches. It usually occurs on the scalp, and it is often called dandruff. The patches may appear on other parts of the body. Skin patches tend to appear where there are many oil glands in the skin. Areas of the body that are commonly affected include the: ?Scalp. ?Ears. ?Eyebrows. ?Face. ?Bearded area of men's faces. ?Skin folds of the body, such as the armpits, groin, and buttocks. ?Chest. ?The condition may come and go for no known reason, and it is often long-lasting (chronic). ?What are the causes? ?The cause of this condition is not known. ?What increases the risk? ?The following factors may make you more likely to develop this condition: ?Having certain conditions, such as: ?HIV (human immunodeficiency virus). ?AIDS (acquired immunodeficiency syndrome). ?Parkinson's disease. ?Mood disorders, such as depression. ?Being 2-39 years old. ?What are the signs or symptoms? ?Symptoms of this condition include: ?Thick scales on the scalp. ?Redness on the face or in the armpits. ?Skin that is flaky. The flakes may be white or yellow. ?Skin that seems oily or dry but is not helped with moisturizers. ?Itching or burning in the affected areas. ?How is this diagnosed? ?This condition is diagnosed with  a medical history and physical exam. A sample of your skin may be tested (skin biopsy). You may need to see a skin specialist (dermatologist). ?How is this treated? ?There is no cure for this condition, but treatment can help to manage the symptoms. You may get treatment to remove scales, lower the risk of skin infection, and reduce swelling or itching. Treatment may include: ?Creams that reduce skin yeast. ?Medicated shampoo. ?Moisturizing creams or ointments. ?Creams that reduce swelling and irritation (steroids). ?Follow these instructions at home: ?Apply over-the-counter and prescription medicines only as told by your health care provider. ?Use any medicated shampoo, skin creams, or ointments only as told by your health care provider. ?Keep all follow-up visits as told by your health care provider. This is important. ?Contact a health care provider if: ?Your symptoms do not improve with treatment. ?Your symptoms get worse. ?You have new symptoms. ?Get help right away if: ?Your condition rapidly worsens with treatment. ?Summary ?Seborrheic dermatitis is a skin disease that causes red, scaly patches. ?Seborrheic dermatitis commonly affects the scalp, face, and skin folds. ?There is no cure for this condition, but treatment can help to manage the symptoms. ?This information is not intended to replace advice given to you by your health care provider. Make sure you discuss any questions you have with your health care provider. ?Document Revised: 03/08/2019 Document Reviewed: 03/08/2019 ?Elsevier Patient Education ? 2022 [place]. ? ? ? ? ? ?

## 2021-09-08 ENCOUNTER — Ambulatory Visit (INDEPENDENT_AMBULATORY_CARE_PROVIDER_SITE_OTHER): Payer: Medicare Other

## 2021-09-08 DIAGNOSIS — E538 Deficiency of other specified B group vitamins: Secondary | ICD-10-CM | POA: Diagnosis not present

## 2021-09-08 MED ORDER — CYANOCOBALAMIN 1000 MCG/ML IJ SOLN
1000.0000 ug | Freq: Once | INTRAMUSCULAR | Status: AC
  Administered 2021-09-08: 1000 ug via INTRAMUSCULAR

## 2021-09-08 NOTE — Progress Notes (Signed)
Pt here for monthly B12 injection per Dr Martinique ? ?B12 1034mg given IM Left Deltoid, and pt tolerated injection well. ? ?Next B12 injection will be scheduled per pt  at her CPE appointment on 09/23/2021 ? ?

## 2021-09-11 ENCOUNTER — Other Ambulatory Visit: Payer: Self-pay | Admitting: Family Medicine

## 2021-09-11 DIAGNOSIS — E039 Hypothyroidism, unspecified: Secondary | ICD-10-CM

## 2021-09-14 DIAGNOSIS — M542 Cervicalgia: Secondary | ICD-10-CM | POA: Diagnosis not present

## 2021-09-14 DIAGNOSIS — M5412 Radiculopathy, cervical region: Secondary | ICD-10-CM | POA: Diagnosis not present

## 2021-09-14 DIAGNOSIS — R030 Elevated blood-pressure reading, without diagnosis of hypertension: Secondary | ICD-10-CM | POA: Diagnosis not present

## 2021-09-16 ENCOUNTER — Telehealth: Payer: Self-pay | Admitting: Pharmacist

## 2021-09-16 NOTE — Chronic Care Management (AMB) (Signed)
? ? ?Chronic Care Management ?Pharmacy Assistant  ? ?Name: Jennifer Goodman  MRN: [id number] DOB: 1935/06/05 ? ?Reason for Encounter: Disease State / Hypertension Assessment Call ?  ?Conditions to be addressed/monitored: ?HTN ? ?Recent office visits:  ?08/18/2021 Betty Martinique MD - Patient was seen for a fall, initial encounter and additional issues. Started Lotrisone cream and Doxycycline 100 mg twice daily. Follow up if symptoms worsen or fail to improve. ? ?Recent consult visits:  ?None ? ?Hospital visits:  ?None ? ?Medications: ?Outpatient Encounter Medications as of 09/16/2021  ?Medication Sig  ? amoxicillin (AMOXIL) 500 MG tablet Take 4 tablet by mouth   BEFORE DENTAL PROCEDURE.  ? Cholecalciferol (VITAMIN D3) 5000 UNITS TABS Take 5,000 tablets by mouth daily.  ? clotrimazole-betamethasone (LOTRISONE) cream Apply 1 application. topically daily.  ? cyanocobalamin (,VITAMIN B-12,) 1000 MCG/ML injection INJECT 1 ML EVERY 3 WEEKS. (Patient taking differently: Inject 1,000 mcg into the muscle. Every 4 weeks.)  ? docusate sodium (COLACE) 100 MG capsule Take 1 capsule (100 mg total) by mouth 2 (two) times daily. (Patient taking differently: Take 100 mg by mouth daily.)  ? etanercept (ENBREL) 50 MG/ML injection Inject 50 mg into the skin once a week. Thursdays  ? folic acid (FOLVITE) 1 MG tablet Take 1 mg by mouth daily.  ? HYDROcodone-acetaminophen (NORCO/VICODIN) 5-325 MG tablet Take 1 tablet by mouth every 8 (eight) hours as needed for moderate pain.  ? irbesartan (AVAPRO) 150 MG tablet Take 1 tablet (150 mg total) by mouth daily.  ? levothyroxine (SYNTHROID) 50 MCG tablet TAKE ONE TABLET IN THE MORNING ON AN EMPTY STOMACH.  ? methotrexate (RHEUMATREX) 2.5 MG tablet Take 20 mg by mouth every Monday.   ? nitrofurantoin (MACRODANTIN) 100 MG capsule Take 100 mg by mouth daily. Urologist.  ? zoledronic acid (RECLAST) 5 MG/100ML SOLN injection Inject 5 mg into the vein once.  ? ?Facility-Administered Encounter Medications as  of 09/16/2021  ?Medication  ? cyanocobalamin ((VITAMIN B-12)) injection 1,000 mcg  ?Fill History:  ?CYANOCOBALAM INJ 1000MCG 12/45/8099 84  ? ?folic acid 1 mg tablet 04/08/2021  ? ?irbesartan 150 mg tablet 04/08/2021  ? ?levothyroxine 100 mcg capsule 04/08/2021  ? ?METHOTREXATE TAB 2.'5MG'$  07/21/2019 28  ? ?Reviewed chart prior to disease state call. Spoke with patient regarding BP ? ?Recent Office Vitals: ?BP Readings from Last 3 Encounters:  ?08/18/21 138/80  ?08/11/21 138/70  ?01/05/21 136/76  ? ?Pulse Readings from Last 3 Encounters:  ?08/18/21 88  ?09/22/20 88  ?09/19/19 78  ?  ?Wt Readings from Last 3 Encounters:  ?08/18/21 145 lb 8 oz (66 kg)  ?09/22/20 147 lb 2 oz (66.7 kg)  ?09/19/19 155 lb 9.6 oz (70.6 kg)  ?  ? ?Kidney Function ?Lab Results  ?Component Value Date/Time  ? CREATININE 0.98 09/12/2020 09:35 AM  ? CREATININE 1.1 05/25/2019 12:00 AM  ? CREATININE 1.00 08/23/2018 10:03 AM  ? GFR 53.39 (L) 09/12/2020 09:35 AM  ? GFRNONAA 47 (L) 11/19/2017 08:09 AM  ? GFRAA 54 (L) 11/19/2017 08:09 AM  ? ? ? ?  Latest Ref Rng & Units 09/12/2020  ?  9:35 AM 05/25/2019  ? 12:00 AM 08/23/2018  ? 10:03 AM  ?BMP  ?Glucose 70 - 99 mg/dL 99    87    ?BUN 6 - 23 mg/dL '21   22      15    '$ ?Creatinine 0.40 - 1.20 mg/dL 0.98   1.1      1.00    ?Sodium 135 -  145 mEq/L 140   140      138    ?Potassium 3.5 - 5.1 mEq/L 4.5   4.4      4.7    ?Chloride 96 - 112 mEq/L 104   104      104    ?CO2 19 - 32 mEq/L '27   21      26    '$ ?Calcium 8.4 - 10.5 mg/dL 10.1   9.0      8.9    ?  ? This result is from an external source.  ? ? ?Current antihypertensive regimen:  ?Irbesartan 150 mg daily ? ?How often are you checking your Blood Pressure? Patient states she is checking her blood pressures when she thinks of it, she states about 2-4 times per month.  ? ?Current home BP readings: Patient doesn't have a log but states her blood pressure readings are always around 130/70. ? ?What recent interventions/DTPs have been made by any provider to improve  Blood Pressure control since last CPP Visit: No recent interventions. ? ?Any recent hospitalizations or ED visits since last visit with CPP? No recent hospital visits. ? ?What diet changes have been made to improve Blood Pressure Control?  ?Patient eats light meals  ?Breakfast - patient will have english muffin and coffee ?Lunch - patient will have sandwich or leftovers ?Dinner - patient will have a meal with meat sometimes and vegetables ? ?What exercise is being done to improve your Blood Pressure Control?  ?Patient walks some and around her house.  ? ?Adherence Review: ?Is the patient currently on ACE/ARB medication? Yes ?Does the patient have >5 day gap between last estimated fill dates? No ? ?Care Gaps: ?AWV - completed on 01/20/21 ?Last BP - 130/70 on 09/22/2020 ?Zoster vaccines - never done ?Pneumonia vaccine - over due ?  ?Star Rating Drug: ?Irbesartan '150mg'$  - last filled on 06/26/2021 90DS at [facility] verified with pharmacy. ? ?Gennie Alma CMA  ?Clinical Pharmacist Assistant ?[phone] ? ?

## 2021-09-21 NOTE — Progress Notes (Signed)
? ? ?HPI: ?[name] is a 87 y.o. female, who is here today for her routine physical. ? ?Last CPE: 09/22/20 ? ?Regular exercise : She has not done so for the past few weeks but planning on resuming walking. ?Following a healthful diet: Cooks some at home and takes out sometimes. She eats plenty of vegetables. ?She has not had a fall since her last visit, 08/18/21. ? ?Chronic medical problems: CKD 3, hypothyroidism, OSA, paroxysmal atrial fibrillation, chronic pain disorder, unstable gait,RA,osteoporosis, and hyperlipidemia among some. ?Cervical radiculopathy, follows with ortho. ? ?Immunization History  ?Administered Date(s) Administered  ? Fluad Quad(high Dose 65+) 04/11/2019, 04/23/2020, 04/10/2021  ? Influenza, High Dose Seasonal PF 02/24/2018  ? PFIZER(Purple Top)SARS-COV-2 Vaccination 07/29/2019, 08/21/2019, 03/03/2020, 10/28/2020  ? Pension scheme manager 37yr & up 04/07/2021  ? Pneumococcal Polysaccharide-23 12/21/2008  ? ?Health Maintenance  ?Topic Date Due  ? Zoster Vaccines- Shingrix (1 of 2) 11/20/2021 (Originally 03/22/1954)  ? INFLUENZA VACCINE  01/12/2022  ? DEXA SCAN  Completed  ? COVID-19 Vaccine  Completed  ? HPV VACCINES  Aged Out  ? Pneumonia Vaccine 87 Years old  Discontinued  ? TETANUS/TDAP  Discontinued  ? ?DEXA on Reclast and planning on repeating DEXA, following with rheumatologist. ?HLD on non pharmacologic treatment. ? ?Lab Results  ?Component Value Date  ? CHOL 204 (H) 09/12/2020  ? HDL 106.40 09/12/2020  ? [place]85 09/12/2020  ? TRIG 64.0 09/12/2020  ? CHOLHDL 2 09/12/2020  ?HTN on Avapro 150 mg daily. ?CKD III: She has not noted decreased urine output or foam in urine. ? ?Lab Results  ?Component Value Date  ? CREATININE 0.98 09/12/2020  ? BUN 21 09/12/2020  ? NA 140 09/12/2020  ? K 4.5 09/12/2020  ? CL 104 09/12/2020  ? CO2 27 09/12/2020  ? ?B12 deficiency: She is on B12 1000 mcg IM q 5 weeks. ? ?Lab Results  ?Component Value Date  ? [id number] 01/23/2021   ? ?Hypothyroidism Dx'ed in 2010. She is on Levothyroxine 50 mcg daily. ?Lab Results  ?Component Value Date  ? TSH 2.77 09/12/2020  ? ?3 months of RUQ pain, when she is in bed, alleviated by Tums x 2-3. ?She has not identified exacerbating factors. ?Pain is mild and with no associated symptoms. ?She acknowledges she does not drink enough fluid. ? ?Review of Systems  ?Constitutional:  Negative for activity change, appetite change and fever.  ?HENT:  Negative for mouth sores, nosebleeds and trouble swallowing.   ?Eyes:  Negative for redness and visual disturbance.  ?Respiratory:  Negative for cough, shortness of breath and wheezing.   ?Cardiovascular:  Negative for chest pain, palpitations and leg swelling.  ?Gastrointestinal:  Negative for abdominal distention, nausea and vomiting.  ?     Negative for changes in bowel habits.  ?Endocrine: Negative for cold intolerance, heat intolerance, polydipsia, polyphagia and polyuria.  ?Genitourinary:  Negative for dysuria and hematuria.  ?Musculoskeletal:  Positive for arthralgias, gait problem and neck pain.  ?Allergic/Immunologic: Negative for environmental allergies.  ?Neurological:  Negative for syncope, weakness and headaches.  ?Psychiatric/Behavioral:  Negative for confusion.   ?All other systems reviewed and are negative. ? ?Current Outpatient Medications on File Prior to Visit  ?Medication Sig Dispense Refill  ? amoxicillin (AMOXIL) 500 MG tablet Take 4 tablet by mouth   BEFORE DENTAL PROCEDURE.    ? Cholecalciferol (VITAMIN D3) 5000 UNITS TABS Take 5,000 tablets by mouth daily.    ? clotrimazole-betamethasone (LOTRISONE) cream Apply 1 application. topically  daily. 30 g 1  ? cyanocobalamin (,VITAMIN B-12,) 1000 MCG/ML injection INJECT 1 ML EVERY 3 WEEKS. (Patient taking differently: Inject 1,000 mcg into the muscle. Every 4 weeks.) 4 mL 0  ? docusate sodium (COLACE) 100 MG capsule Take 1 capsule (100 mg total) by mouth 2 (two) times daily. (Patient taking differently:  Take 100 mg by mouth daily.) 10 capsule 0  ? etanercept (ENBREL) 50 MG/ML injection Inject 50 mg into the skin once a week. Thursdays    ? folic acid (FOLVITE) 1 MG tablet Take 1 mg by mouth daily.    ? HYDROcodone-acetaminophen (NORCO/VICODIN) 5-325 MG tablet Take 1 tablet by mouth every 8 (eight) hours as needed for moderate pain.    ? irbesartan (AVAPRO) 150 MG tablet TAKE ONE TABLET BY MOUTH DAILY. 90 tablet 3  ? levothyroxine (SYNTHROID) 50 MCG tablet TAKE ONE TABLET IN THE MORNING ON AN EMPTY STOMACH. 90 tablet 3  ? methotrexate (RHEUMATREX) 2.5 MG tablet Take 20 mg by mouth every Monday.     ? nitrofurantoin (MACRODANTIN) 100 MG capsule Take 100 mg by mouth daily. Urologist.    ? zoledronic acid (RECLAST) 5 MG/100ML SOLN injection Inject 5 mg into the vein once.    ? ?Current Facility-Administered Medications on File Prior to Visit  ?Medication Dose Route Frequency Provider Last Rate Last Admin  ? cyanocobalamin ((VITAMIN B-12)) injection 1,000 mcg  1,000 mcg Intramuscular Q30 days Martinique, Kamaal Cast G, MD   1,000 mcg at 08/11/21 1025  ? ?Past Medical History:  ?Diagnosis Date  ? Anemia   ? Arthritis   ? Rhuematoid-takes Enbrel and Methorexate   ? Back pain   ? states in Jan 2017 spinal was attempted 4 times and has had back pain since.  ? Cancer [facility])   ? skin cancer on head  ? Chicken pox   ? Diverticulosis   ? History of colon polyps   ? benign  ? Hypertension   ? takes Avapro daily  ? Hypothyroidism   ? takes Synthroid daily  ? Joint pain   ? Pneumonia   ? at age 34  ? PONV (postoperative nausea and vomiting)   ? the patch helps!  ? Urinary frequency   ? Urinary urgency   ? was taking Myrbetriq but stopped.Plans to start back  ? ? ?Past Surgical History:  ?Procedure Laterality Date  ? ACHILLES TENDON SURGERY Left 11/17/2017  ? Procedure: ACHILLES TENDON REPAIR;  Surgeon: Melrose Nakayama, MD;  Location: [place];  Service: Orthopedics;  Laterality: Left;  ? CHOLECYSTECTOMY  2000  ? COLONOSCOPY    ? EYE SURGERY Left    ? tear duct surgery  ? HAND TENDON SURGERY    ? Dr. Amedeo Plenty  ? KNEE ARTHROSCOPY    ? ORIF CALCANEOUS FRACTURE Left 11/17/2017  ? Procedure: OPEN TREATMENT OF CALCANEOUS FRACTURE;  Surgeon: Melrose Nakayama, MD;  Location: [place];  Service: Orthopedics;  Laterality: Left;  Patient is now an inpatient at [facility] will request that she be moved to [facility] and admitted there for this procedure to be done there.  ? ORIF ELBOW FRACTURE Right 02/25/2017  ? Procedure: OPEN REDUCTION INTERNAL FIXATION (ORIF) RIGHT PROXIMAL OLECRANON FRACTURE;  Surgeon: Iran Planas, MD;  Location: [place];  Service: Orthopedics;  Laterality: Right;  ? TOTAL KNEE ARTHROPLASTY Left 06/20/2015  ? TOTAL KNEE ARTHROPLASTY Left 06/20/2015  ? Procedure: TOTAL KNEE ARTHROPLASTY;  Surgeon: Dorna Leitz, MD;  Location: [place];  Service: Orthopedics;  Laterality: Left;  ?  TOTAL KNEE ARTHROPLASTY Right 05/28/2016  ? Procedure: TOTAL KNEE ARTHROPLASTY;  Surgeon: Dorna Leitz, MD;  Location: [place];  Service: Orthopedics;  Laterality: Right;  ? ? ?Allergies  ?Allergen Reactions  ? Oxycodone Other (See Comments)  ?  Makes hallucinates  ? Sulfamethoxazole Nausea Only  ? ? ?Family History  ?Problem Relation Age of Onset  ? Cancer Father   ?     colon ca/ lung ca  ? Heart disease Father   ? ? ?Social History  ? ?Socioeconomic History  ? Marital status: Married  ?  Spouse name: Not on file  ? Number of children: 2  ? Years of education: Not on file  ? Highest education level: Not on file  ?Occupational History  ? Occupation: Retired  ?Tobacco Use  ? Smoking status: Former  ?  Types: Cigarettes  ?  Quit date: 06/14/1978  ?  Years since quitting: 43.3  ? Smokeless tobacco: Never  ? Tobacco comments:  ?  quit 32 yrs ago  ?Vaping Use  ? Vaping Use: Never used  ?Substance and Sexual Activity  ? Alcohol use: Yes  ?  Comment: occ  ? Drug use: No  ? Sexual activity: Never  ?Other Topics Concern  ? Not on file  ?Social History Narrative  ? She lives in [place] with her husband.  ? ?Social  Determinants of Health  ? ?Financial Resource Strain: Low Risk   ? Difficulty of Paying Living Expenses: Not hard at all  ?Food Insecurity: No Food Insecurity  ? Worried About Charity fundraiser in the

## 2021-09-22 ENCOUNTER — Other Ambulatory Visit: Payer: Self-pay | Admitting: Family Medicine

## 2021-09-22 DIAGNOSIS — I10 Essential (primary) hypertension: Secondary | ICD-10-CM

## 2021-09-22 DIAGNOSIS — N1831 Chronic kidney disease, stage 3a: Secondary | ICD-10-CM

## 2021-09-23 ENCOUNTER — Ambulatory Visit (INDEPENDENT_AMBULATORY_CARE_PROVIDER_SITE_OTHER): Payer: Medicare Other | Admitting: Family Medicine

## 2021-09-23 ENCOUNTER — Encounter: Payer: Self-pay | Admitting: Family Medicine

## 2021-09-23 VITALS — BP 126/70 | HR 75 | Resp 16 | Ht 64.0 in | Wt 149.4 lb

## 2021-09-23 DIAGNOSIS — N1831 Chronic kidney disease, stage 3a: Secondary | ICD-10-CM | POA: Diagnosis not present

## 2021-09-23 DIAGNOSIS — E7800 Pure hypercholesterolemia, unspecified: Secondary | ICD-10-CM

## 2021-09-23 DIAGNOSIS — E039 Hypothyroidism, unspecified: Secondary | ICD-10-CM | POA: Diagnosis not present

## 2021-09-23 DIAGNOSIS — Z0000 Encounter for general adult medical examination without abnormal findings: Secondary | ICD-10-CM | POA: Diagnosis not present

## 2021-09-23 DIAGNOSIS — I10 Essential (primary) hypertension: Secondary | ICD-10-CM | POA: Diagnosis not present

## 2021-09-23 DIAGNOSIS — R1011 Right upper quadrant pain: Secondary | ICD-10-CM | POA: Diagnosis not present

## 2021-09-23 DIAGNOSIS — E538 Deficiency of other specified B group vitamins: Secondary | ICD-10-CM | POA: Diagnosis not present

## 2021-09-23 DIAGNOSIS — M8000XA Age-related osteoporosis with current pathological fracture, unspecified site, initial encounter for fracture: Secondary | ICD-10-CM

## 2021-09-23 LAB — BASIC METABOLIC PANEL
BUN: 18 mg/dL (ref 6–23)
CO2: 26 mEq/L (ref 19–32)
Calcium: 9.8 mg/dL (ref 8.4–10.5)
Chloride: 105 mEq/L (ref 96–112)
Creatinine, Ser: 1.2 mg/dL (ref 0.40–1.20)
GFR: 41.57 mL/min — ABNORMAL LOW (ref >60.00)
Glucose, Bld: 92 mg/dL (ref 70–99)
Potassium: 4 mEq/L (ref 3.5–5.1)
Sodium: 140 mEq/L (ref 135–145)

## 2021-09-23 LAB — TSH: TSH: 3.38 u[IU]/mL (ref 0.35–5.50)

## 2021-09-23 LAB — VITAMIN D 25 HYDROXY (VIT D DEFICIENCY, FRACTURES): VITD: 65.27 ng/mL (ref 30.00–100.00)

## 2021-09-23 LAB — VITAMIN B12: Vitamin B-12: 635 pg/mL (ref 211–911)

## 2021-09-23 MED ORDER — PANTOPRAZOLE SODIUM 20 MG PO TBEC: 20.0000 mg | DELAYED_RELEASE_TABLET | Freq: Every day | ORAL | 1 refills | Status: DC

## 2021-09-23 NOTE — Patient Instructions (Addendum)
A few things to remember from today's visit: ? ? ?Routine general medical examination at a health care facility ? ?Age-related osteoporosis with current pathological fracture, initial encounter ? ?Hypertension, essential, benign - Plan: Basic metabolic panel ? ?Vitamin B12 deficiency - Plan: Vitamin B12 ? ?Hypothyroidism (acquired) - Plan: TSH ? ?Stage 3a chronic kidney disease ([place]) - Plan: VITAMIN D 25 Hydroxy (Vit-D Deficiency, Fractures) ? ?Hypercholesterolemia ? ?RUQ pain ? ?If you need refills please call your pharmacy. ?Do not use My Chart to request refills or for acute issues that need immediate attention. ?  ?Abdominal pain can be digestive, so let's try Protonix for 4-6 weeks. ?Pay attention to possible trigger factors. ? ? ?Please be sure medication list is accurate. ?If a new problem present, please set up appointment sooner than planned today. ? ?A few tips: ? ?-As we age balance is not as good as it was, so there is a higher risks for falls. Please remove small rugs and furniture that is "in your way" and could increase the risk of falls. ?Stretching exercises may help with fall prevention: Yoga and Tai Chi are some examples. Low impact exercise is better, so you are not very achy the next day. ? ?-Sun screen and avoidance of direct sun light recommended. ?Caution with dehydration, if working outdoors be sure to drink enough fluids. ? ?- Some medications are not safe as we age, increases the risk of side effects and can potentially interact with other medication you are also taken;  including some of over the counter medications. Be sure to let me know when you start a new medication even if it is a dietary/vitamin supplement. ? ? ?-Healthy diet low in red meet/animal fat and sugar + regular physical activity is recommended.  ? ?  ? ?

## 2021-09-25 DIAGNOSIS — M5412 Radiculopathy, cervical region: Secondary | ICD-10-CM | POA: Diagnosis not present

## 2021-10-07 DIAGNOSIS — Z85828 Personal history of other malignant neoplasm of skin: Secondary | ICD-10-CM | POA: Diagnosis not present

## 2021-10-07 DIAGNOSIS — C44629 Squamous cell carcinoma of skin of left upper limb, including shoulder: Secondary | ICD-10-CM | POA: Diagnosis not present

## 2021-10-07 DIAGNOSIS — L700 Acne vulgaris: Secondary | ICD-10-CM | POA: Diagnosis not present

## 2021-10-07 DIAGNOSIS — L821 Other seborrheic keratosis: Secondary | ICD-10-CM | POA: Diagnosis not present

## 2021-10-09 ENCOUNTER — Ambulatory Visit (INDEPENDENT_AMBULATORY_CARE_PROVIDER_SITE_OTHER): Payer: Medicare Other

## 2021-10-09 DIAGNOSIS — E538 Deficiency of other specified B group vitamins: Secondary | ICD-10-CM

## 2021-10-09 NOTE — Progress Notes (Signed)
Per orders of Martinique, Betty G, MD, injection of B12 given in left  deltoid by Billyjack Trompeter D Soley Harriss. ?Patient tolerated injection well. ? ?Lab Results  ?Component Value Date  ? [id number] 09/23/2021  ? ? ?  ?

## 2021-10-21 DIAGNOSIS — M810 Age-related osteoporosis without current pathological fracture: Secondary | ICD-10-CM | POA: Diagnosis not present

## 2021-10-21 DIAGNOSIS — Z79899 Other long term (current) drug therapy: Secondary | ICD-10-CM | POA: Diagnosis not present

## 2021-10-21 DIAGNOSIS — M47812 Spondylosis without myelopathy or radiculopathy, cervical region: Secondary | ICD-10-CM | POA: Diagnosis not present

## 2021-10-21 DIAGNOSIS — M0589 Other rheumatoid arthritis with rheumatoid factor of multiple sites: Secondary | ICD-10-CM | POA: Diagnosis not present

## 2021-10-21 DIAGNOSIS — M25512 Pain in left shoulder: Secondary | ICD-10-CM | POA: Diagnosis not present

## 2021-10-21 DIAGNOSIS — G894 Chronic pain syndrome: Secondary | ICD-10-CM | POA: Diagnosis not present

## 2021-10-21 DIAGNOSIS — M722 Plantar fascial fibromatosis: Secondary | ICD-10-CM | POA: Diagnosis not present

## 2021-10-21 DIAGNOSIS — M1991 Primary osteoarthritis, unspecified site: Secondary | ICD-10-CM | POA: Diagnosis not present

## 2021-10-21 DIAGNOSIS — S86012D Strain of left Achilles tendon, subsequent encounter: Secondary | ICD-10-CM | POA: Diagnosis not present

## 2021-11-06 DIAGNOSIS — M67912 Unspecified disorder of synovium and tendon, left shoulder: Secondary | ICD-10-CM | POA: Diagnosis not present

## 2021-11-06 DIAGNOSIS — M75122 Complete rotator cuff tear or rupture of left shoulder, not specified as traumatic: Secondary | ICD-10-CM | POA: Diagnosis not present

## 2021-11-06 DIAGNOSIS — M67911 Unspecified disorder of synovium and tendon, right shoulder: Secondary | ICD-10-CM | POA: Diagnosis not present

## 2021-11-06 DIAGNOSIS — M75121 Complete rotator cuff tear or rupture of right shoulder, not specified as traumatic: Secondary | ICD-10-CM | POA: Diagnosis not present

## 2021-11-09 ENCOUNTER — Ambulatory Visit: Payer: Medicare Other

## 2021-11-10 ENCOUNTER — Ambulatory Visit (INDEPENDENT_AMBULATORY_CARE_PROVIDER_SITE_OTHER): Payer: Medicare Other

## 2021-11-10 ENCOUNTER — Telehealth: Payer: Self-pay | Admitting: Family Medicine

## 2021-11-10 DIAGNOSIS — E538 Deficiency of other specified B group vitamins: Secondary | ICD-10-CM

## 2021-11-10 DIAGNOSIS — C44629 Squamous cell carcinoma of skin of left upper limb, including shoulder: Secondary | ICD-10-CM | POA: Diagnosis not present

## 2021-11-10 MED ORDER — CYANOCOBALAMIN 1000 MCG/ML IJ SOLN
1000.0000 ug | Freq: Once | INTRAMUSCULAR | Status: AC
  Administered 2021-11-10: 1000 ug via INTRAMUSCULAR

## 2021-11-10 NOTE — Progress Notes (Signed)
Pt here for monthly B12 injection per Dr Martinique  B12 1035mg given IM, and pt tolerated injection well.  Next B12 injection scheduled for one month.

## 2021-11-10 NOTE — Telephone Encounter (Signed)
The appt made in office this morning was accidentally made with Dr. Martinique.  It has been cancelled.  Pt needs another B12 appt on 12/11/21 made to see the nurse.

## 2021-12-11 ENCOUNTER — Telehealth: Payer: Self-pay

## 2021-12-11 ENCOUNTER — Ambulatory Visit: Payer: Medicare Other | Admitting: Family Medicine

## 2021-12-11 ENCOUNTER — Ambulatory Visit (INDEPENDENT_AMBULATORY_CARE_PROVIDER_SITE_OTHER): Payer: Medicare Other

## 2021-12-11 DIAGNOSIS — E538 Deficiency of other specified B group vitamins: Secondary | ICD-10-CM

## 2021-12-11 MED ORDER — PANTOPRAZOLE SODIUM 20 MG PO TBEC: 20.0000 mg | DELAYED_RELEASE_TABLET | Freq: Every day | ORAL | 3 refills | Status: DC

## 2021-12-11 MED ORDER — CYANOCOBALAMIN 1000 MCG/ML IJ SOLN
1000.0000 ug | Freq: Once | INTRAMUSCULAR | Status: AC
  Administered 2021-12-11: 1000 ug via INTRAMUSCULAR

## 2021-12-11 NOTE — Telephone Encounter (Signed)
Rx has been sent in. 

## 2021-12-11 NOTE — Addendum Note (Signed)
Addended by: Nathanial Millman E on: 12/11/2021 11:07 AM   Modules accepted: Orders

## 2021-12-11 NOTE — Progress Notes (Signed)
Per orders of Martinique, Jennifer G, MD, injection of B12 given in   deltoid by Cleona Doubleday D Latese Dufault. Patient tolerated injection well.  Lab Results  Component Value Date   [id number] 09/23/2021

## 2021-12-11 NOTE — Telephone Encounter (Signed)
Patient was here for nurse visit and asked if Rx refill could be sent in pantoprazole (PROTONIX) 20 MG tablet  [facility]

## 2021-12-17 DIAGNOSIS — M19011 Primary osteoarthritis, right shoulder: Secondary | ICD-10-CM | POA: Diagnosis not present

## 2021-12-17 DIAGNOSIS — H40023 Open angle with borderline findings, high risk, bilateral: Secondary | ICD-10-CM | POA: Diagnosis not present

## 2021-12-17 DIAGNOSIS — M19012 Primary osteoarthritis, left shoulder: Secondary | ICD-10-CM | POA: Diagnosis not present

## 2021-12-17 DIAGNOSIS — H26493 Other secondary cataract, bilateral: Secondary | ICD-10-CM | POA: Diagnosis not present

## 2021-12-17 DIAGNOSIS — M67911 Unspecified disorder of synovium and tendon, right shoulder: Secondary | ICD-10-CM | POA: Diagnosis not present

## 2021-12-17 DIAGNOSIS — H31003 Unspecified chorioretinal scars, bilateral: Secondary | ICD-10-CM | POA: Diagnosis not present

## 2021-12-17 DIAGNOSIS — M67912 Unspecified disorder of synovium and tendon, left shoulder: Secondary | ICD-10-CM | POA: Diagnosis not present

## 2021-12-23 DIAGNOSIS — M810 Age-related osteoporosis without current pathological fracture: Secondary | ICD-10-CM | POA: Diagnosis not present

## 2021-12-24 DIAGNOSIS — M810 Age-related osteoporosis without current pathological fracture: Secondary | ICD-10-CM | POA: Diagnosis not present

## 2021-12-29 ENCOUNTER — Emergency Department (HOSPITAL_COMMUNITY)
Admission: EM | Admit: 2021-12-29 | Discharge: 2021-12-30 | Disposition: A | Discharge status: Home / Self Care | Payer: Medicare Other | Attending: Emergency Medicine | Admitting: Emergency Medicine

## 2021-12-29 ENCOUNTER — Emergency Department (HOSPITAL_COMMUNITY): Payer: Medicare Other

## 2021-12-29 ENCOUNTER — Encounter (HOSPITAL_COMMUNITY): Payer: Self-pay

## 2021-12-29 ENCOUNTER — Other Ambulatory Visit: Payer: Self-pay

## 2021-12-29 DIAGNOSIS — S82831A Other fracture of upper and lower end of right fibula, initial encounter for closed fracture: Secondary | ICD-10-CM | POA: Insufficient documentation

## 2021-12-29 DIAGNOSIS — Z79899 Other long term (current) drug therapy: Secondary | ICD-10-CM | POA: Diagnosis not present

## 2021-12-29 DIAGNOSIS — Z85828 Personal history of other malignant neoplasm of skin: Secondary | ICD-10-CM | POA: Insufficient documentation

## 2021-12-29 DIAGNOSIS — S8991XA Unspecified injury of right lower leg, initial encounter: Secondary | ICD-10-CM | POA: Diagnosis present

## 2021-12-29 DIAGNOSIS — S82101A Unspecified fracture of upper end of right tibia, initial encounter for closed fracture: Secondary | ICD-10-CM | POA: Diagnosis not present

## 2021-12-29 DIAGNOSIS — I10 Essential (primary) hypertension: Secondary | ICD-10-CM | POA: Insufficient documentation

## 2021-12-29 DIAGNOSIS — Y92000 Kitchen of unspecified non-institutional (private) residence as  the place of occurrence of the external cause: Secondary | ICD-10-CM | POA: Insufficient documentation

## 2021-12-29 DIAGNOSIS — E039 Hypothyroidism, unspecified: Secondary | ICD-10-CM | POA: Diagnosis not present

## 2021-12-29 DIAGNOSIS — Z471 Aftercare following joint replacement surgery: Secondary | ICD-10-CM | POA: Diagnosis not present

## 2021-12-29 DIAGNOSIS — Z96653 Presence of artificial knee joint, bilateral: Secondary | ICD-10-CM | POA: Insufficient documentation

## 2021-12-29 DIAGNOSIS — Z743 Need for continuous supervision: Secondary | ICD-10-CM | POA: Diagnosis not present

## 2021-12-29 DIAGNOSIS — Z96651 Presence of right artificial knee joint: Secondary | ICD-10-CM | POA: Diagnosis not present

## 2021-12-29 DIAGNOSIS — W19XXXA Unspecified fall, initial encounter: Secondary | ICD-10-CM

## 2021-12-29 DIAGNOSIS — M79661 Pain in right lower leg: Secondary | ICD-10-CM | POA: Diagnosis not present

## 2021-12-29 DIAGNOSIS — R6889 Other general symptoms and signs: Secondary | ICD-10-CM | POA: Diagnosis not present

## 2021-12-29 DIAGNOSIS — S72001A Fracture of unspecified part of neck of right femur, initial encounter for closed fracture: Secondary | ICD-10-CM | POA: Diagnosis not present

## 2021-12-29 DIAGNOSIS — W1839XA Other fall on same level, initial encounter: Secondary | ICD-10-CM | POA: Insufficient documentation

## 2021-12-29 DIAGNOSIS — R609 Edema, unspecified: Secondary | ICD-10-CM | POA: Diagnosis not present

## 2021-12-29 MED ORDER — HYDROCODONE-ACETAMINOPHEN 5-325 MG PO TABS
1.0000 | ORAL_TABLET | Freq: Once | ORAL | Status: AC
  Administered 2021-12-29: 1 via ORAL

## 2021-12-29 MED ORDER — HYDROCODONE-ACETAMINOPHEN 5-325 MG PO TABS
ORAL_TABLET | ORAL | Status: AC
  Filled 2021-12-29: qty 1

## 2021-12-29 MED ORDER — HYDROCODONE-ACETAMINOPHEN 5-325 MG PO TABS
1.0000 | ORAL_TABLET | Freq: Once | ORAL | Status: AC
  Administered 2021-12-29: 1 via ORAL
  Filled 2021-12-29: qty 1

## 2021-12-29 NOTE — Discharge Instructions (Addendum)
Knee immobilizer at all times and use the walker to help get around take the pain medication you have at home hydrocodone as needed.  Make an appointment follow-up with [facility].  Now based on the fact that there is some evidence of a nondisplaced tibia fracture approximately as well.  Cannot weight-bear but can touch your toe with the right foot.  Use your walker at all times.  Knee immobilizer can be removed to shower.  But otherwise wear it at all times.  Take the hydrocodone that you have at home you can take 1 or 2 tablets every 6 hours.

## 2021-12-29 NOTE — ED Provider Notes (Addendum)
[facility] DEPT Provider Note   CSN: [id number] Arrival date & time: 12/29/21  2003     History  Chief Complaint  Patient presents with   Jennifer Goodman is a 87 y.o. female.  Patient brought in by EMS.  Patient had a fall in the kitchen at about 1800.  Patient's had bilateral knee replacements.  Done by [facility].  With go for orthopedic surgery.  The right knee replacement was in December 2017 and the left one was in January 2017.  Patient did not hit her head no loss of consciousness.  Patient says she was bending over lost her balance and fell forward she has pain to the right leg laterally around the knee area.  EMS gave her some fentanyl in route.  No obvious deformity.  Patient is not on any blood thinners.  Patient denies any neck pain any head pain any upper or lower back pain.  No extremity pain other than around the right knee area.  Past medical history significant for history of anemia hypertension hypothyroidism diverticulosis skin cancer.  Patient's previous smoker quit in 1980.       Home Medications Prior to Admission medications   Medication Sig Start Date End Date Taking? Authorizing Provider  amoxicillin (AMOXIL) 500 MG tablet Take 4 tablet by mouth   BEFORE DENTAL PROCEDURE. 08/24/19   [provider]  Cholecalciferol (VITAMIN D3) 5000 UNITS TABS Take 5,000 tablets by mouth daily.    [provider]  clotrimazole-betamethasone (LOTRISONE) cream Apply 1 application. topically daily. 08/18/21   Martinique, Betty G, MD  cyanocobalamin (,VITAMIN B-12,) 1000 MCG/ML injection INJECT 1 ML EVERY 3 WEEKS. Patient taking differently: Inject 1,000 mcg into the muscle. Every 4 weeks. 12/26/18   Martinique, Betty G, MD  docusate sodium (COLACE) 100 MG capsule Take 1 capsule (100 mg total) by mouth 2 (two) times daily. Patient taking differently: Take 100 mg by mouth daily. 11/19/17   Regalado, Belkys A, MD  etanercept  (ENBREL) 50 MG/ML injection Inject 50 mg into the skin once a week. Thursdays    [provider]  folic acid (FOLVITE) 1 MG tablet Take 1 mg by mouth daily.    [provider]  HYDROcodone-acetaminophen (NORCO/VICODIN) 5-325 MG tablet Take 1 tablet by mouth every 8 (eight) hours as needed for moderate pain.    [provider]  irbesartan (AVAPRO) 150 MG tablet TAKE ONE TABLET BY MOUTH DAILY. 09/23/21   Martinique, Betty G, MD  levothyroxine (SYNTHROID) 50 MCG tablet TAKE ONE TABLET IN THE MORNING ON AN EMPTY STOMACH. 09/11/21   Martinique, Betty G, MD  methotrexate (RHEUMATREX) 2.5 MG tablet Take 20 mg by mouth every Monday.     [provider]  nitrofurantoin (MACRODANTIN) 100 MG capsule Take 100 mg by mouth daily. Urologist.    [provider]  pantoprazole (PROTONIX) 20 MG tablet Take 1 tablet (20 mg total) by mouth daily. 12/11/21   Martinique, Betty G, MD  zoledronic acid (RECLAST) 5 MG/100ML SOLN injection Inject 5 mg into the vein once.    [provider]      Allergies    Oxycodone and Sulfamethoxazole    Review of Systems   Review of Systems  Constitutional:  Negative for chills and fever.  HENT:  Negative for ear pain and sore throat.   Eyes:  Negative for pain and visual disturbance.  Respiratory:  Negative for cough and shortness of breath.  Cardiovascular:  Negative for chest pain and palpitations.  Gastrointestinal:  Negative for abdominal pain and vomiting.  Genitourinary:  Negative for dysuria and hematuria.  Musculoskeletal:  Positive for joint swelling. Negative for arthralgias and back pain.  Skin:  Negative for color change and rash.  Neurological:  Negative for seizures and syncope.  All other systems reviewed and are negative.   Physical Exam Updated Vital Signs BP (!) 153/62   Pulse 79   Temp 98 F (36.7 C)   Resp 17   Ht 1.626 m ('5\' 4"'$ )   Wt 67 kg   SpO2 99%   BMI 25.35 kg/m  Physical Exam Vitals and nursing  note reviewed.  Constitutional:      General: She is not in acute distress.    Appearance: Normal appearance. She is well-developed.  HENT:     Head: Normocephalic and atraumatic.     Mouth/Throat:     Mouth: Mucous membranes are moist.  Eyes:     Extraocular Movements: Extraocular movements intact.     Conjunctiva/sclera: Conjunctivae normal.     Pupils: Pupils are equal, round, and reactive to light.  Cardiovascular:     Rate and Rhythm: Normal rate and regular rhythm.     Heart sounds: No murmur heard. Pulmonary:     Effort: Pulmonary effort is normal. No respiratory distress.     Breath sounds: Normal breath sounds.  Abdominal:     Palpations: Abdomen is soft.     Tenderness: There is no abdominal tenderness.  Musculoskeletal:        General: Swelling, tenderness and deformity present.     Cervical back: Neck supple.     Comments: 's with some arthritic deformities to her hands.  The right knee area patella is not dislocated.  No evidence of effusion there is tenderness to palpation to the proximal fibula.  A little bit of tenderness to the medial part of the tibia.  Ankle may be slightly swollen but no pain to palpation no pain with range of motion of the ankle.  Dorsalis pedis pulses 2+ to both feet good cap refill and sensation intact.  Skin:    General: Skin is warm and dry.     Capillary Refill: Capillary refill takes less than 2 seconds.  Neurological:     General: No focal deficit present.     Mental Status: She is alert and oriented to person, place, and time.     Cranial Nerves: No cranial nerve deficit.     Sensory: No sensory deficit.     Motor: No weakness.  Psychiatric:        Mood and Affect: Mood normal.     ED Results / Procedures / Treatments   Labs (all labs ordered are listed, but only abnormal results are displayed) Labs Reviewed - No data to display  EKG None  Radiology CT Knee Right Wo Contrast  Result Date: 12/29/2021 CLINICAL DATA:  Knee  trauma, occult fracture suspected. EXAM: CT OF THE RIGHT KNEE WITHOUT CONTRAST TECHNIQUE: Multidetector CT imaging of the right knee was performed according to the standard protocol. Multiplanar CT image reconstructions were also generated. RADIATION DOSE REDUCTION: This exam was performed according to the departmental dose-optimization program which includes automated exposure control, adjustment of the mA and/or kV according to patient size and/or use of iterative reconstruction technique. COMPARISON:  12/29/2021. FINDINGS: Bones/Joint/Cartilage Total knee arthroplasty changes are noted without evidence of hardware loosening. There is a nondisplaced fracture of the fibular neck.  A nondisplaced transverse fracture of the proximal tibial metadiaphysis which abuts the distal portion of the femoral component. The patella is high-riding and the patellar tendon is not well seen on exam due to hardware artifact. Dystrophic calcifications are noted about the patella. There is a trace joint effusion. Ligaments Suboptimally assessed by CT. Muscles and Tendons No intramuscular hematoma. The patellar tendon is not well seen on exam due to hardware artifact. Soft tissues Vascular calcifications are present in the soft tissues. Mild subcutaneous fat stranding is noted about the proximal tibia and fibula IMPRESSION: 1. Total knee arthroplasty changes. Undisplaced fracture of the proximal tibial metadiaphysis which abuts the distal femoral component. No definite evidence of hardware loosening at this time. 2. Nondisplaced fracture of the femoral neck. 3. High-riding patella. The patellar tendon is not visualized on exam. Consider further evaluation with MRI if clinically warranted. Electronically Signed   By: Brett Fairy M.D.   On: 12/29/2021 22:38   DG Tibia/Fibula Right  Result Date: 12/29/2021 CLINICAL DATA:  Fall and trauma to the right lower extremity. EXAM: RIGHT TIBIA AND FIBULA - 2 VIEW COMPARISON:  None Available.  FINDINGS: There is a nondisplaced fracture of the fibular neck. There is superior positioning of the patella with fragmented ossification of the patellar tendon. There is a total right knee arthroplasty. The arthroplasty components appear intact. There is no dislocation. The soft tissues are unremarkable. IMPRESSION: 1. Nondisplaced fracture of the fibular neck. 2. Superior positioning of the patella with fragmented ossification of the patellar tendon. 3. Total right knee arthroplasty appears intact. Electronically Signed   By: Anner Crete M.D.   On: 12/29/2021 20:41    Procedures Procedures    Medications Ordered in ED Medications  HYDROcodone-acetaminophen (NORCO/VICODIN) 5-325 MG per tablet (  Not Given 12/29/21 2346)  HYDROcodone-acetaminophen (NORCO/VICODIN) 5-325 MG per tablet 1 tablet (1 tablet Oral Given 12/29/21 2149)  HYDROcodone-acetaminophen (NORCO/VICODIN) 5-325 MG per tablet 1 tablet (1 tablet Oral Given 12/29/21 2312)    ED Course/ Medical Decision Making/ A&P                           Medical Decision Making Amount and/or Complexity of Data Reviewed Radiology: ordered.  Risk Prescription drug management.   X-ray of the right tib-fib showed a proximal nondisplaced fracture of the fibular neck.  Discussed with on call will go for orthopedic surgeon recommended getting CT but since there was some tenderness to the medial aspect of the tibia.  Does not seem to be an ankle sprain.  So I do not think the force came up from the ankle and then out of the proximal fibula.  If CT shows just the proximal fibula fracture patient be treated with knee immobilizer and weight-bear as tolerated pain control.  Patient has hydrocodone available at home to take.  If it shows evidence of a tibial fracture the patient can still be treated with knee immobilizer but will only be out of touch weight-bear.  And she does have a walker at home already.  Either way patient follows up in the office  with [facility].  The tibia fracture seems to be a little bit is extensive although not displaced.  Will re discussed with orthopedics.  Just to make sure no changes.  Patient with some improvement with pain medication.  Patient's patellofemoral tendon is definitely intact patient can straight leg raise her right leg.   Final Clinical Impression(s) / ED Diagnoses Final  diagnoses:  Fall, initial encounter  Other closed fracture of proximal end of right fibula, initial encounter  Closed fracture of proximal end of right tibia, unspecified fracture morphology, initial encounter    Rx / DC Orders ED Discharge Orders     None         Fredia Sorrow, MD 12/29/21 2245    Fredia Sorrow, MD 12/29/21 2310    Fredia Sorrow, MD 12/29/21 2349

## 2021-12-29 NOTE — ED Notes (Signed)
Xray in room now

## 2021-12-29 NOTE — ED Notes (Signed)
MD aware of pain

## 2021-12-29 NOTE — ED Triage Notes (Signed)
BIBA from home, mechanical fall from standing position. Pain to right leg. No deformity noted.  Denies BT, LOC, Dizziness.  142mg fent with EMS

## 2021-12-29 NOTE — ED Notes (Signed)
Pt placed on purewick 

## 2021-12-31 ENCOUNTER — Telehealth: Payer: Self-pay | Admitting: Pharmacist

## 2021-12-31 DIAGNOSIS — M9711XA Periprosthetic fracture around internal prosthetic right knee joint, initial encounter: Secondary | ICD-10-CM | POA: Diagnosis not present

## 2021-12-31 DIAGNOSIS — S82401A Unspecified fracture of shaft of right fibula, initial encounter for closed fracture: Secondary | ICD-10-CM | POA: Diagnosis not present

## 2021-12-31 NOTE — Chronic Care Management (AMB) (Signed)
    Chronic Care Management Pharmacy Assistant   Name: ROBINA HAMOR  MRN: [id number] DOB: 1934/09/01  01/04/2022 [place], No answer, left message of appointment on 01/04/2022 at 2:00 via telephone visit with Jennifer Goodman, Pharm D. Notified to have all medications, supplements, blood pressure and/or blood sugar logs available during appointment and to return call if need to reschedule.  Care Gaps: AWV - message sent to Jennifer Goodman Last BP - 126/70 on 09/23/2021 Shingrix - never done  Star Rating Drug: Irbesartan '150mg'$  - last filled 12/23/2021 90 DS at [facility]. Verified with Abigail Butts  Any gaps in medications fill history? No  Gennie Alma [facility]  Catering manager [phone]

## 2022-01-01 ENCOUNTER — Telehealth: Payer: Self-pay

## 2022-01-01 DIAGNOSIS — I10 Essential (primary) hypertension: Secondary | ICD-10-CM

## 2022-01-01 NOTE — Telephone Encounter (Signed)
-----   Message from Viona Gilmore, [facility] sent at 01/01/2022 10:02 AM EDT ----- Regarding: CCM referral Hi,  Can you please put in a CCM referral for Ms. Jennifer Goodman?  Thank you for all of your help! Maddie

## 2022-01-04 ENCOUNTER — Ambulatory Visit (INDEPENDENT_AMBULATORY_CARE_PROVIDER_SITE_OTHER): Payer: Medicare Other | Admitting: Pharmacist

## 2022-01-04 DIAGNOSIS — E538 Deficiency of other specified B group vitamins: Secondary | ICD-10-CM

## 2022-01-04 DIAGNOSIS — M8000XA Age-related osteoporosis with current pathological fracture, unspecified site, initial encounter for fracture: Secondary | ICD-10-CM

## 2022-01-04 DIAGNOSIS — I10 Essential (primary) hypertension: Secondary | ICD-10-CM

## 2022-01-04 NOTE — Progress Notes (Signed)
Chronic Care Management Pharmacy Note  01/04/2022 Name:  Jennifer Goodman MRN:  [id number] DOB:  Jan 26, 1935  Summary: BP was elevated with recent ED visit Pt's pain is mostly controlled post fall  Recommendations/Changes made from today's visit: -Recommended restarting routine monitoring of BP at home -Recommended bringing BP cuff to next office visit to ensure accuracy  Plan: Follow up BP assessment in 3-4 months  Subjective: Jennifer Goodman is an 87 y.o. year old female who is a primary patient of Martinique, Malka So, MD.  The CCM team was consulted for assistance with disease management and care coordination needs.    Engaged with patient by telephone for follow up visit in response to provider referral for pharmacy case management and/or care coordination services.   Consent to Services:  The patient was given information about Chronic Care Management services, agreed to services, and gave verbal consent prior to initiation of services.  Please see initial visit note for detailed documentation.   Patient Care Team: Martinique, Betty G, MD as PCP - General (Family Medicine) Juanita Craver, MD as Attending Physician (Gastroenterology) Viona Gilmore, [facility] as Pharmacist (Pharmacist)  Recent office visits: 12/11/21 [name], CMA: Patient presented for vitamin B12 injection.  09/23/21 Betty Martinique, MD: Patient presented for annual exam. Prescribed pantoprazole daily x 4 weeks. Follow up in 6 months.  08/18/2021 Betty Martinique MD - Patient was seen for a fall, initial encounter and additional issues. Started Lotrisone cream and Doxycycline 100 mg twice daily. Follow up if symptoms worsen or fail to improve.  Recent consult visits: 09/14/21 Kristeen Miss MD [facility]) - seen for lumbar radiculopathy and cervical radiculopathy.  Unable to access notes.  07/08/21 Sydnee Levans (dermatology): Patient presented for dermatology follow up and biopsy. Unable to access  notes.  05/25/21 Leigh Aurora (rheumatology): Unable to access notes.  Hospital visits: 7/18-7/19/23 Patient presented to the [facility] ED for 4 hours for a fall. X-ray demonstrated fracture of fibular neck.   Objective:  Lab Results  Component Value Date   CREATININE 1.20 09/23/2021   BUN 18 09/23/2021   GFR 41.57 (L) 09/23/2021   GFRNONAA 47 (L) 11/19/2017   GFRAA 54 (L) 11/19/2017   NA 140 09/23/2021   K 4.0 09/23/2021   CALCIUM 9.8 09/23/2021   CO2 26 09/23/2021   GLUCOSE 92 09/23/2021    Lab Results  Component Value Date/Time   GFR 41.57 (L) 09/23/2021 11:06 AM   GFR 53.39 (L) 09/12/2020 09:35 AM   MICROALBUR 1.0 08/23/2018 10:03 AM   MICROALBUR 1.4 08/24/2017 11:20 AM    Last diabetic Eye exam: No results found for: "HMDIABEYEEXA"  Last diabetic Foot exam: No results found for: "HMDIABFOOTEX"   Lab Results  Component Value Date   CHOL 204 (H) 09/12/2020   HDL 106.40 09/12/2020   LDLCALC 85 09/12/2020   TRIG 64.0 09/12/2020   CHOLHDL 2 09/12/2020       Latest Ref Rng & Units 05/25/2019   12:00 AM 11/16/2017    5:40 AM 05/18/2016   11:40 AM  Hepatic Function  Total Protein 6.5 - 8.1 g/dL  6.0  6.4   Albumin 3.5 - 5.0 4.0     3.7  4.0   AST 13 - 35 33     29  32   ALT 7 - 35 31     18  19    Alk Phosphatase 25 - 125 63     62  60  Total Bilirubin 0.3 - 1.2 mg/dL  1.6  1.3      This result is from an external source.    Lab Results  Component Value Date/Time   TSH 3.38 09/23/2021 11:06 AM   TSH 2.77 09/12/2020 09:35 AM       Latest Ref Rng & Units 09/12/2020    9:35 AM 05/25/2019   12:00 AM 11/19/2017   10:09 AM  CBC  WBC 4.0 - 10.5 K/uL 6.8  7.0     8.1   Hemoglobin 12.0 - 15.0 g/dL 11.6  11.6     10.9   Hematocrit 36.0 - 46.0 % 35.8  34     35.1   Platelets 150.0 - 400.0 K/uL 247.0  310     212      This result is from an external source.    Lab Results  Component Value Date/Time   VD25OH 65.27 09/23/2021 11:06 AM    VD25OH 89.52 02/24/2018 11:31 AM    Clinical ASCVD: No  The ASCVD Risk score (Arnett DK, et al., 2019) failed to calculate for the following reasons:   The 2019 ASCVD risk score is only valid for ages 43 to 37       08/18/2021    4:07 PM 01/20/2021    9:10 AM 01/20/2021    9:08 AM  Depression screen PHQ 2/9  Decreased Interest 0 0 0  Down, Depressed, Hopeless 0 0 0  PHQ - 2 Score 0 0 0     Social History   Tobacco Use  Smoking Status Former   Types: Cigarettes   Quit date: 06/14/1978   Years since quitting: 43.5  Smokeless Tobacco Never  Tobacco Comments   quit 32 yrs ago   BP Readings from Last 3 Encounters:  12/29/21 (!) 151/78  09/23/21 126/70  08/18/21 138/80   Pulse Readings from Last 3 Encounters:  12/29/21 79  09/23/21 75  08/18/21 88   Wt Readings from Last 3 Encounters:  12/29/21 147 lb 11.3 oz (67 kg)  09/23/21 149 lb 6 oz (67.8 kg)  08/18/21 145 lb 8 oz (66 kg)   BMI Readings from Last 3 Encounters:  12/29/21 25.35 kg/m  09/23/21 25.64 kg/m  08/18/21 24.98 kg/m    Assessment/Interventions: Review of patient past medical history, allergies, medications, health status, including review of consultants reports, laboratory and other test data, was performed as part of comprehensive evaluation and provision of chronic care management services.   SDOH:  (Social Determinants of Health) assessments and interventions performed: Yes   SDOH Screenings   Alcohol Screen: Not on file  Depression (PHQ2-9): Low Risk  (08/18/2021)   Depression (PHQ2-9)    PHQ-2 Score: 0  Financial Resource Strain: Low Risk  (01/20/2021)   Overall Financial Resource Strain (CARDIA)    Difficulty of Paying Living Expenses: Not hard at all  Food Insecurity: No Food Insecurity (01/20/2021)   Hunger Vital Sign    Worried About Running Out of Food in the Last Year: Never true    Ran Out of Food in the Last Year: Never true  Housing: Low Risk  (01/20/2021)   Housing    Last Housing Risk  Score: 0  Physical Activity: Inactive (01/20/2021)   Exercise Vital Sign    Days of Exercise per Week: 0 days    Minutes of Exercise per Session: 0 min  Social Connections: Moderately Isolated (01/20/2021)   Social Connection and Isolation Panel [NHANES]    Frequency of Communication  with Friends and Family: Three times a week    Frequency of Social Gatherings with Friends and Family: Three times a week    Attends Religious Services: Never    Active Member of Clubs or Organizations: No    Attends Archivist Meetings: Never    Marital Status: Married  Stress: No Stress Concern Present (01/20/2021)   [place]    Feeling of Stress : Only a little  Tobacco Use: Medium Risk (12/29/2021)   Patient History    Smoking Tobacco Use: Former    Smokeless Tobacco Use: Never    Passive Exposure: Not on file  Transportation Needs: No Transportation Needs (01/20/2021)   PRAPARE - Hydrologist (Medical): No    Lack of Transportation (Non-Medical): No   CCM Care Plan  Allergies  Allergen Reactions   Oxycodone Other (See Comments)    Makes hallucinates   Sulfamethoxazole Nausea Only    Medications Reviewed Today     Reviewed by Viona Gilmore, [facility] (Pharmacist) on 01/04/22 at 1410  Med List Status: <None>   Medication Order Taking? Sig Documenting Provider Last Dose Status Informant  amoxicillin (AMOXIL) 500 MG tablet [id number]  Take 4 tablet by mouth   BEFORE DENTAL PROCEDURE. [provider]  Active   Cholecalciferol (VITAMIN D3) 5000 UNITS TABS [id number]  Take 5,000 tablets by mouth daily. [provider]  Active Self  clotrimazole-betamethasone (LOTRISONE) cream [id number]  Apply 1 application. topically daily. Martinique, Betty G, MD  Active   cyanocobalamin (,VITAMIN B-12,) 1000 MCG/ML injection [id number]  INJECT 1 ML EVERY 3 WEEKS.  Patient taking differently: Inject 1,000  mcg into the muscle. Every 4 weeks.   Martinique, Betty G, MD  Active   docusate sodium (COLACE) 100 MG capsule [id number]  Take 1 capsule (100 mg total) by mouth 2 (two) times daily.  Patient taking differently: Take 100 mg by mouth daily.   Regalado, Belkys A, MD  Active   etanercept (ENBREL) 50 MG/ML injection [id number] Yes Inject 50 mg into the skin once a week. Thursdays [provider] Taking Active Self           Med Note Leslie Dales, ANNA E   Tue Nov 16, 5051  9:76 PM)    folic acid (FOLVITE) 1 MG tablet [id number]  Take 1 mg by mouth daily. [provider]  Active Self  HYDROcodone-acetaminophen (NORCO/VICODIN) 5-325 MG tablet [id number] Yes Take 1 tablet by mouth every 8 (eight) hours as needed for moderate pain. [provider] Taking Active   irbesartan (AVAPRO) 150 MG tablet [id number]  TAKE ONE TABLET BY MOUTH DAILY. Martinique, Betty G, MD  Active   levothyroxine (SYNTHROID) 50 MCG tablet [id number]  TAKE ONE TABLET IN THE MORNING ON AN EMPTY STOMACH. Martinique, Betty G, MD  Active   methotrexate [facility]) 2.5 MG tablet [id number]  Take 20 mg by mouth every Monday.  [provider]  Active Self           Med Note Leslie Dales, ANNA E   Tue Nov 15, 2017  9:10 PM)    nitrofurantoin (MACRODANTIN) 100 MG capsule [id number]  Take 100 mg by mouth daily. Urologist. [provider]  Active   pantoprazole (PROTONIX) 20 MG tablet [id number]  Take 1 tablet (20 mg total) by mouth daily. Martinique, Betty G, MD  Active   zoledronic acid (RECLAST) 5 MG/100ML SOLN injection [id number]  Inject 5 mg into  the vein once. [provider]  Active             Patient Active Problem List   Diagnosis Date Noted   Hypercholesterolemia 02/24/2018   Paroxysmal atrial fibrillation ([place])    Fall 11/15/2017   Displaced fracture of fifth metatarsal bone, left foot, initial encounter for closed fracture 11/15/2017   Achilles tendon avulsion 11/15/2017   Fracture, foot, left,  closed, initial encounter 11/15/2017   Hypertension, essential, benign 08/24/2017   Hypothyroidism (acquired) 08/24/2017   CKD (chronic kidney disease), stage III ([place]) 08/24/2017   Infection of urinary tract 05/29/2016   Primary osteoarthritis of right knee 05/28/2016   Primary osteoarthritis of left knee 06/20/2015   Osteoporosis 06/20/2015   Closed displaced fracture of medial condyle of left femur ([place]) 06/20/2015   Abdominal pain, RLQ (right lower quadrant) 05/19/2012    Immunization History  Administered Date(s) Administered   Fluad Quad(high Dose 65+) 04/11/2019, 04/23/2020, 04/10/2021   Influenza, High Dose Seasonal PF 02/24/2018   PFIZER(Purple Top)SARS-COV-2 Vaccination 07/29/2019, 08/21/2019, 03/03/2020, 10/28/2020   Pfizer Covid-19 Vaccine Bivalent Booster 43yr & up 04/07/2021   Pneumococcal Polysaccharide-23 12/21/2008   Patient just broken her bone in 2 places and she is resting a lot right now. She has an appointment to go back to the orthopedic doctor on August 1st.  Patient reports her pain is not excruciating. Her pain is not terrible but mostly just hurts when she moves it and pain hurts more when she first stands up. Patient has an appt with rheumatology in August.   Patient denies questions and concerns with her medications and denies any side effects with her medications. Patient had a stick of butter and she dropped the butter paper on the floor and she twisted her leg and that's how she fell.   Patient is trying to get acclimated to be at home. Patient is not having headaches or anything. Patient doesn't think she hit her head with the fall.  Conditions to be addressed/monitored:  Hypertension, Chronic Kidney Disease, Hypothyroidism, Osteoporosis, Osteoarthritis, and Vitamin B12 deficiency and Rheumatoid arthritis   Conditions addressed this visit: Osteoporosis, hypertension  Care Plan : CCM Pharmacy Care Plan  Updates made by PViona Gilmore RLibertysince  01/04/2022 12:00 AM     Problem: Problem: Hypertension, Chronic Kidney Disease, Hypothyroidism, Osteopenia, Osteoarthritis, and Vitamin B12 deficiency and Rheumatoid arthritis      Long-Range Goal: Patient-Specific Goal   Start Date: 01/05/2021  Expected End Date: 01/05/2022  Recent Progress: On track  Priority: High  Note:   Current Barriers:  Unable to independently monitor therapeutic efficacy  Pharmacist Clinical Goal(s):  Patient will achieve adherence to monitoring guidelines and medication adherence to achieve therapeutic efficacy through collaboration with PharmD and provider.   Interventions: 1:1 collaboration with JMartinique Betty G, MD regarding development and update of comprehensive plan of care as evidenced by provider attestation and co-signature Inter-disciplinary care team collaboration (see longitudinal plan of care) Comprehensive medication review performed; medication list updated in electronic medical record  Hypertension (BP goal <140/90) -Controlled -Current treatment: Irbesartan 150 mg 1 tablet daily - in AM - Appropriate, Effective, Safe, Accessible -Medications previously tried: valsartan  -Current home readings: 138/62, usually in the 106-130s (purchased an arm machine) -Current dietary habits: doesn't pay attention to salt intake; uses lower sodium salt -Current exercise habits: walking some -Denies hypotensive/hypertensive symptoms -Educated on BP goals and benefits of medications for prevention of heart attack, stroke and kidney damage; Exercise goal of 150  minutes per week; Importance of home blood pressure monitoring; Proper BP monitoring technique; -Counseled to monitor BP at home weekly, document, and provide log at future appointments -Counseled on diet and exercise extensively Recommended to continue current medication  Osteopenia (Goal prevent fractures) -Controlled -Last DEXA Scan: 03/29/2018   T-Score femoral neck: -1.9  T-Score total hip:  n/a  T-Score lumbar spine: n/a  T-Score forearm radius: -1.7  10-year probability of major osteoporotic fracture: 17.2%  10-year probability of hip fracture: 4.5% -Patient is a candidate for pharmacologic treatment due to T-Score -1.0 to -2.5 and 10-year risk of hip fracture > 3% -Current treatment  Reclast 5 mg inject yearly  (injection in July 2022) - Appropriate, Effective, Safe, Accessible  Vitamin D 5000 units daily - Appropriate, Effective, Query Safe, Accessible Calcium carbonate 600 mg - Appropriate, Effective, Safe, Accessible -Medications previously tried: Alendronate (trouble with timing) -Recommend [phone] units of vitamin D daily. Recommend 1200 mg of calcium daily from dietary and supplemental sources. Recommend weight-bearing and muscle strengthening exercises for building and maintaining bone density. -Recommended to continue current medication Recommended for patient to add more calcium to her diet as she prefers this route than additional supplementation.  Rheumatoid arthritis (Goal: minimize pain) -Controlled -Current treatment  Methotrexate 2.5 mg 20 mg every Monday - Appropriate, Effective, Safe, Accessible Folic acid 1 mg 1 tablet daily - Appropriate, Effective, Safe, Accessible Enbrel 50 mg/mL inject once weekly - Appropriate, Effective, Safe, Accessible -Medications previously tried: n/a  -Recommended to continue current medication Patient is receiving patient assistance for Enbrel.  Pain (Goal: minimize pain) -Controlled -Current treatment  Hydrocodone-APAP 5-325 mg 1 tablet in morning and 1 tablet at night - Appropriate, Effective, Query Safe, Accessible -Medications previously tried: n/a  -Recommended to continue current medication  Hypothyroidism (Goal: TSH 2.5-3.5 - given concurrent osteopenia) -Controlled -Current treatment  Levothyroxine 50 mcg 1 tablet daily - Appropriate, Effective, Safe, Accessible -Medications previously tried: none   -Recommended to continue current medication Counseled on importance of taking on an empty stomach prior to food.  Vitamin B12 deficiency (Goal: 211-911) -Controlled -Current treatment  Vitamin B12 Inject 1000 mcg every 4 weeks - Appropriate, Effective, Safe, Accessible -Medications previously tried: none  -Recommended to continue current medication Counseled on possible supplementation with sublingual tablets if patient does not continue with injections.   Health Maintenance -Vaccine gaps: shingrix (rhuematologist said no), COVID booster -Current therapy:  Amoxicillin 500 mg prior to dental procedure Docusate 100 mg 1 capsule once daily -Educated on Cost vs benefit of each product must be carefully weighed by individual consumer -Patient is satisfied with current therapy and denies issues -Recommended to continue current medication  Patient Goals/Self-Care Activities Patient will:  - take medications as prescribed check blood pressure weekly, document, and provide at future appointments target a minimum of 150 minutes of moderate intensity exercise weekly  Follow Up Plan: The care management team will reach out to the patient again over the next 3 days.        Medication Assistance:  Enbrel obtained through [place] medication assistance program.  Enrollment ends summer 2023   Compliance/Adherence/Medication fill history: Care Gaps: Shingrix, PCV20 Last BP - 126/70 on 09/23/2021  Star-Rating Drugs: Irbesartan 129m - [facility]ified with WAbigail Butts [id number]: [phone]: [phone]  Uses pill  box? Yes Pt endorses 100% compliance - never misses one  We discussed: Current pharmacy is preferred with insurance plan and patient is satisfied with pharmacy services Patient  decided to: Continue current medication management strategy  Care Plan and Follow Up Patient Decision:  Patient agrees to Care Plan and Follow-up.  Plan: The care management team will reach out to the patient again over the next 3 days.  [name], PharmD, [place] Pharmacist [place] [facility]

## 2022-01-07 ENCOUNTER — Ambulatory Visit (INDEPENDENT_AMBULATORY_CARE_PROVIDER_SITE_OTHER): Payer: Medicare Other

## 2022-01-07 DIAGNOSIS — E538 Deficiency of other specified B group vitamins: Secondary | ICD-10-CM | POA: Diagnosis not present

## 2022-01-07 MED ORDER — CYANOCOBALAMIN 1000 MCG/ML IJ SOLN
1000.0000 ug | Freq: Once | INTRAMUSCULAR | Status: AC
  Administered 2022-01-07: 1000 ug via INTRAMUSCULAR

## 2022-01-07 NOTE — Progress Notes (Addendum)
Per orders of Dr. Jerilee Hoh, injection of Cyanocobalamin 100 mcg given by Ashleigh Luckow L Gianah Batt. Patient tolerated injection well.  Patient arrived 1 week early for B-12 injection. Per ok from [employer] B-12 was administered

## 2022-01-11 DIAGNOSIS — I10 Essential (primary) hypertension: Secondary | ICD-10-CM | POA: Diagnosis not present

## 2022-01-11 DIAGNOSIS — M8000XA Age-related osteoporosis with current pathological fracture, unspecified site, initial encounter for fracture: Secondary | ICD-10-CM

## 2022-01-12 ENCOUNTER — Telehealth: Payer: Self-pay | Admitting: Family Medicine

## 2022-01-12 DIAGNOSIS — M25561 Pain in right knee: Secondary | ICD-10-CM | POA: Diagnosis not present

## 2022-01-12 NOTE — Telephone Encounter (Signed)
Left message for patient to call back and schedule Medicare Annual Wellness Visit (AWV) either virtually or in office. Left  my Herbie Drape number [phone]   Last AWV ;01/20/21 please schedule at anytime with [facility] Nurse Health Advisor 1 or 2

## 2022-01-22 ENCOUNTER — Ambulatory Visit (INDEPENDENT_AMBULATORY_CARE_PROVIDER_SITE_OTHER): Payer: Medicare Other

## 2022-01-22 VITALS — Ht 64.0 in | Wt 149.0 lb

## 2022-01-22 DIAGNOSIS — Z0000 Encounter for general adult medical examination without abnormal findings: Secondary | ICD-10-CM

## 2022-01-22 NOTE — Progress Notes (Signed)
Subjective:   Jennifer Goodman is a 87 y.o. female who presents for Medicare Annual (Subsequent) preventive examination.  Review of Systems    Virtual Visit via Telephone Note  I connected with  Arelia Sneddon on 01/22/22 at  2:15 PM EDT by telephone and verified that I am speaking with the correct person using two identifiers.  Location: Patient: Home Provider: Office Persons participating in the virtual visit: patient/Nurse Health Advisor   I discussed the limitations, risks, security and privacy concerns of performing an evaluation and management service by telephone and the availability of in person appointments. The patient expressed understanding and agreed to proceed.  Interactive audio and video telecommunications were attempted between this nurse and patient, however failed, due to patient having technical difficulties OR patient did not have access to video capability.  We continued and completed visit with audio only.  Some vital signs may be absent or patient reported.   Criselda Peaches, LPN  Cardiac Risk Factors include: advanced age (>61mn, >>90women);hypertension     Objective:    Today's Vitals   01/22/22 1420  Weight: 149 lb (67.6 kg)  Height: '5\' 4"'$  (1.626 m)   Body mass index is 25.58 kg/m.     01/22/2022    2:30 PM 12/29/2021    8:12 PM 01/20/2021    9:09 AM 11/16/2017   12:30 AM 02/25/2017   10:15 AM 02/18/2017    8:55 PM 05/18/2016   11:18 AM  Advanced Directives  Does Patient Have a Medical Advance Directive? Yes No Yes Yes  Yes Yes  Type of AParamedicof [place]entonLiving will  HLake RobertsLiving will HGileadLiving will  HFair PlayLiving will HBoydLiving will  Does patient want to make changes to medical advance directive? No - Patient declined   No - Patient declined No - Patient declined No - Patient declined   Copy of [place]  Chart? No - copy requested  No - copy requested No - copy requested  No - copy requested No - copy requested    Current Medications (verified) Outpatient Encounter Medications as of 01/22/2022  Medication Sig   amoxicillin (AMOXIL) 500 MG tablet Take 4 tablet by mouth   BEFORE DENTAL PROCEDURE.   Cholecalciferol (VITAMIN D3) 5000 UNITS TABS Take 5,000 tablets by mouth daily.   clotrimazole-betamethasone (LOTRISONE) cream Apply 1 application. topically daily.   cyanocobalamin (,VITAMIN B-12,) 1000 MCG/ML injection INJECT 1 ML EVERY 3 WEEKS. (Patient taking differently: Inject 1,000 mcg into the muscle. Every 4 weeks.)   docusate sodium (COLACE) 100 MG capsule Take 1 capsule (100 mg total) by mouth 2 (two) times daily. (Patient taking differently: Take 100 mg by mouth daily.)   etanercept (ENBREL) 50 MG/ML injection Inject 50 mg into the skin once a week. Thursdays   folic acid (FOLVITE) 1 MG tablet Take 1 mg by mouth daily.   HYDROcodone-acetaminophen (NORCO/VICODIN) 5-325 MG tablet Take 1 tablet by mouth every 8 (eight) hours as needed for moderate pain.   irbesartan (AVAPRO) 150 MG tablet TAKE ONE TABLET BY MOUTH DAILY.   levothyroxine (SYNTHROID) 50 MCG tablet TAKE ONE TABLET IN THE MORNING ON AN EMPTY STOMACH.   methotrexate (RHEUMATREX) 2.5 MG tablet Take 20 mg by mouth every Monday.    nitrofurantoin (MACRODANTIN) 100 MG capsule Take 100 mg by mouth daily. Urologist.   pantoprazole (PROTONIX) 20 MG tablet Take 1 tablet (20 mg  total) by mouth daily.   zoledronic acid (RECLAST) 5 MG/100ML SOLN injection Inject 5 mg into the vein once.   No facility-administered encounter medications on file as of 01/22/2022.    Allergies (verified) Oxycodone and Sulfamethoxazole   History: Past Medical History:  Diagnosis Date   Anemia    Arthritis    Rhuematoid-takes Enbrel and Methorexate    Back pain    states in Jan 2017 spinal was attempted 4 times and has had back pain since.   Cancer ([place])     skin cancer on head   Chicken pox    Diverticulosis    History of colon polyps    benign   Hypertension    takes Avapro daily   Hypothyroidism    takes Synthroid daily   Joint pain    Pneumonia    at age 28   PONV (postoperative nausea and vomiting)    the patch helps!   Urinary frequency    Urinary urgency    was taking Myrbetriq but stopped.Plans to start back   Past Surgical History:  Procedure Laterality Date   ACHILLES TENDON SURGERY Left 11/17/2017   Procedure: ACHILLES TENDON REPAIR;  Surgeon: Melrose Nakayama, MD;  Location: [facility];  Service: Orthopedics;  Laterality: Left;   CHOLECYSTECTOMY  2000   COLONOSCOPY     EYE SURGERY Left    tear duct surgery   HAND TENDON SURGERY     Dr. Amedeo Plenty   KNEE ARTHROSCOPY     ORIF CALCANEOUS FRACTURE Left 11/17/2017   Procedure: OPEN TREATMENT OF CALCANEOUS FRACTURE;  Surgeon: Melrose Nakayama, MD;  Location: [facility];  Service: Orthopedics;  Laterality: Left;  Patient is now an inpatient at [facility] will request that she be moved to [facility] and admitted there for this procedure to be done there.   ORIF ELBOW FRACTURE Right 02/25/2017   Procedure: OPEN REDUCTION INTERNAL FIXATION (ORIF) RIGHT PROXIMAL OLECRANON FRACTURE;  Surgeon: Iran Planas, MD;  Location: [place];  Service: Orthopedics;  Laterality: Right;   TOTAL KNEE ARTHROPLASTY Left 06/20/2015   TOTAL KNEE ARTHROPLASTY Left 06/20/2015   Procedure: TOTAL KNEE ARTHROPLASTY;  Surgeon: Dorna Leitz, MD;  Location: [place];  Service: Orthopedics;  Laterality: Left;   TOTAL KNEE ARTHROPLASTY Right 05/28/2016   Procedure: TOTAL KNEE ARTHROPLASTY;  Surgeon: Dorna Leitz, MD;  Location: [place];  Service: Orthopedics;  Laterality: Right;   Family History  Problem Relation Age of Onset   Cancer Father        colon ca/ lung ca   Heart disease Father    Social History   Socioeconomic History   Marital status: Married    Spouse name: Not on file   Number of children: 2   Years of education: Not on file    Highest education level: Not on file  Occupational History   Occupation: Retired  Tobacco Use   Smoking status: Former    Types: Cigarettes    Quit date: 06/14/1978    Years since quitting: 43.6   Smokeless tobacco: Never   Tobacco comments:    quit 32 yrs ago  Vaping Use   Vaping Use: Never used  Substance and Sexual Activity   Alcohol use: Yes    Comment: occ   Drug use: No   Sexual activity: Never  Other Topics Concern   Not on file  Social History Narrative   She lives in [place] with her husband.   Social Determinants of Health   Financial Resource Strain:  Low Risk  (01/22/2022)   Overall Financial Resource Strain (CARDIA)    Difficulty of Paying Living Expenses: Not hard at all  Food Insecurity: No Food Insecurity (01/22/2022)   Hunger Vital Sign    Worried About Running Out of Food in the Last Year: Never true    Ran Out of Food in the Last Year: Never true  Transportation Needs: No Transportation Needs (01/22/2022)   PRAPARE - Hydrologist (Medical): No    Lack of Transportation (Non-Medical): No  Physical Activity: Sufficiently Active (01/22/2022)   Exercise Vital Sign    Days of Exercise per Week: 5 days    Minutes of Exercise per Session: 30 min  Stress: No Stress Concern Present (01/22/2022)   [place]    Feeling of Stress : Not at all  Social Connections: [place] (01/22/2022)   Social Connection and Isolation Panel [NHANES]    Frequency of Communication with Friends and Family: More than three times a week    Frequency of Social Gatherings with Friends and Family: More than three times a week    Attends Religious Services: More than 4 times per year    Active Member of [employer] or Organizations: Yes    Attends Music therapist: More than 4 times per year    Marital Status: Married    Tobacco Counseling Counseling given: Not  Answered Tobacco comments: quit 32 yrs ago   Clinical Intake:   Diabetic?  No    Activities of Daily Living    01/22/2022    2:27 PM  In your present state of health, do you have any difficulty performing the following activities:  Hearing? 0  Vision? 0  Difficulty concentrating or making decisions? 0  Walking or climbing stairs? 0  Dressing or bathing? 0  Doing errands, shopping? 0  Preparing Food and eating ? N  Using the Toilet? N  In the past six months, have you accidently leaked urine? Y  Comment Followed by Burt Knack  Do you have problems with loss of bowel control? N  Managing your Medications? N  Managing your Finances? N  Housekeeping or managing your Housekeeping? N    Patient Care Team: [place], Betty G, MD as PCP - General (Family Medicine) Juanita Craver, MD as Attending Physician (Gastroenterology) Viona Gilmore, [facility] as Pharmacist (Pharmacist)  Indicate any recent Medical Services you may have received from other than [facility] providers in the past year (date may be approximate).     Assessment:   This is a routine wellness examination for [place].  Hearing/Vision screen Hearing Screening - Comments:: No hearing difficulty Vision Screening - Comments:: Wears glasses. Followed by Dr Delman Cheadle  Dietary issues and exercise activities discussed: Exercise limited by: None identified   Goals Addressed               This Visit's Progress     Patient stated (pt-stated)        I would like to take a short vacation.       Depression Screen    01/22/2022    2:25 PM 08/18/2021    4:07 PM 01/20/2021    9:10 AM 01/20/2021    9:08 AM 09/22/2020   11:24 AM 09/04/2019    1:24 PM 08/29/2018    1:07 PM  PHQ 2/9 Scores  PHQ - 2 Score 0 0 0 0 0 0 0    Fall Risk  01/22/2022    2:28 PM 08/18/2021    4:07 PM 01/20/2021    9:10 AM 09/22/2020   11:30 AM 09/04/2019   12:34 PM  Fall Risk   Falls in the past year? 1 1 0 0 0  Number falls in past yr: 0 0 0  0  Injury  with Fall? 1 1 0  0  Comment Fx Rt leg. Followed by Orthopedic      Risk for fall due to :   No Fall Risks Orthopedic patient Orthopedic patient  Follow up Falls prevention discussed  Falls evaluation completed  Education provided    FALL RISK PREVENTION PERTAINING TO THE HOME:  Any stairs in or around the home? Yes  If so, are there any without handrails? No  Home free of loose throw rugs in walkways, pet beds, electrical cords, etc? Yes  Adequate lighting in your home to reduce risk of falls? Yes   ASSISTIVE DEVICES UTILIZED TO PREVENT FALLS:  Life alert? No  Use of a cane, walker or w/c? Yes  Grab bars in the bathroom? Yes  Shower chair or bench in shower? No  Elevated toilet seat or a handicapped toilet? Yes   TIMED UP AND GO:  Was the test performed? No . Audio Visit  Cognitive Function:      01/22/2022    2:31 PM  6CIT Screen  What Year? 0 points  What month? 0 points  What time? 0 points  Count back from 20 0 points  Months in reverse 0 points  Repeat phrase 0 points  Total Score 0 points    Immunizations Immunization History  Administered Date(s) Administered   Fluad Quad(high Dose 65+) 04/11/2019, 04/23/2020, 04/10/2021   Influenza, High Dose Seasonal PF 02/24/2018   PFIZER(Purple Top)SARS-COV-2 Vaccination 07/29/2019, 08/21/2019, 03/03/2020, 10/28/2020   Pfizer Covid-19 Vaccine Bivalent Booster 30yr & up 04/07/2021   Pneumococcal Polysaccharide-23 12/21/2008      Flu Vaccine status: Up to date    Covid-19 vaccine status: Completed vaccines  Qualifies for Shingles Vaccine? No   Zostavax completed No   Shingrix Completed?: No.    Education has been provided regarding the importance of this vaccine. Patient has been advised to call insurance company to determine out of pocket expense if they have not yet received this vaccine. Advised may also receive vaccine at local pharmacy or Health Dept. Verbalized acceptance and understanding.  Screening  Tests Health Maintenance  Topic Date Due   INFLUENZA VACCINE  01/12/2022   COVID-19 Vaccine (6 - Pfizer risk series) 02/07/2022 (Originally 06/02/2021)   DEXA SCAN  Completed   HPV VACCINES  Aged Out   Pneumonia Vaccine 85 Years old  Discontinued   TETANUS/TDAP  Discontinued   Zoster Vaccines- Shingrix  Discontinued    Health Maintenance  Health Maintenance Due  Topic Date Due   INFLUENZA VACCINE  01/12/2022    Colorectal cancer screening: No longer required.   Mammogram status: No longer required due to Age.  Bone Density status: Completed 03/29/18. Results reflect: Bone density results: OSTEOPOROSIS. Repeat every   years.  Lung Cancer Screening: (Low Dose CT Chest recommended if Age 85-80years, 30 pack-year currently smoking OR have quit w/in 15years.)  qualify.     Additional Screening:  Hepatitis C Screening: does not qualify; Completed   Vision Screening: Recommended annual ophthalmology exams for early detection of glaucoma and other disorders of the eye. Is the patient up to date with their annual eye exam?  Yes  Who is the provider or what is the name of the office in which the patient attends annual eye exams? Dr Delman Cheadle If pt is not established with a provider, would they like to be referred to a provider to establish care? No .   Dental Screening: Recommended annual dental exams for proper oral hygiene  Community Resource Referral / Chronic Care Management:  CRR required this visit?  No   CCM required this visit?  No      Plan:     I have personally reviewed and noted the following in the patient's chart:   Medical and social history Use of alcohol, tobacco or illicit drugs  Current medications and supplements including opioid prescriptions.  Functional ability and status Nutritional status Physical activity Advanced directives List of other physicians Hospitalizations, surgeries, and ER visits in previous 12 months Vitals Screenings to  include cognitive, depression, and falls Referrals and appointments  In addition, I have reviewed and discussed with patient certain preventive protocols, quality metrics, and best practice recommendations. A written personalized care plan for preventive services as well as general preventive health recommendations were provided to patient.     Criselda Peaches, LPN   [id number]   Nurse Notes: None

## 2022-01-22 NOTE — Patient Instructions (Addendum)
Jennifer Goodman , Thank you for taking time to come for your Medicare Wellness Visit. I appreciate your ongoing commitment to your health goals. Please review the following plan we discussed and let me know if I can assist you in the future.   These are the goals we discussed:  Goals       Patient stated (pt-stated)      I would like to take a short vacation.      Track and Manage My Blood Pressure-Hypertension      Timeframe:  Short-Term Goal Priority:  Medium Start Date:                             Expected End Date:                       Follow Up Date 03/13/21    - check blood pressure weekly - choose a place to take my blood pressure (home, clinic or office, retail store) - write blood pressure results in a log or diary    Why is this important?   You won't feel high blood pressure, but it can still hurt your blood vessels.  High blood pressure can cause heart or kidney problems. It can also cause a stroke.  Making lifestyle changes like losing a little weight or eating less salt will help.  Checking your blood pressure at home and at different times of the day can help to control blood pressure.  If the doctor prescribes medicine remember to take it the way the doctor ordered.  Call the office if you cannot afford the medicine or if there are questions about it.     Notes:         This is a list of the screening recommended for you and due dates:  Health Maintenance  Topic Date Due   Flu Shot  01/12/2022   COVID-19 Vaccine (6 - Pfizer risk series) 02/07/2022*   DEXA scan (bone density measurement)  Completed   HPV Vaccine  Aged Out   Pneumonia Vaccine  Discontinued   Tetanus Vaccine  Discontinued   Zoster (Shingles) Vaccine  Discontinued  *Topic was postponed. The date shown is not the original due date.   Opioid Pain Medicine Management Opioids are powerful medicines that are used to treat moderate to severe pain. When used for short periods of time, they can help  you to: Sleep better. Do better in physical or occupational therapy. Feel better in the first few days after an injury. Recover from surgery. Opioids should be taken with the supervision of a trained health care provider. They should be taken for the shortest period of time possible. This is because opioids can be addictive, and the longer you take opioids, the greater your risk of addiction. This addiction can also be called opioid use disorder. What are the risks? Using opioid pain medicines for longer than 3 days increases your risk of side effects. Side effects include: Constipation. Nausea and vomiting. Breathing difficulties (respiratory depression). Drowsiness. Confusion. Opioid use disorder. Itching. Taking opioid pain medicine for a long period of time can affect your ability to do daily tasks. It also puts you at risk for: Motor vehicle crashes. Depression. Suicide. Heart attack. Overdose, which can be life-threatening. What is a pain treatment plan? A pain treatment plan is an agreement between you and your health care provider. Pain is unique to each person, and treatments  vary depending on your condition. To manage your pain, you and your health care provider need to work together. To help you do this: Discuss the goals of your treatment, including how much pain you might expect to have and how you will manage the pain. Review the risks and benefits of taking opioid medicines. Remember that a good treatment plan uses more than one approach and minimizes the chance of side effects. Be honest about the amount of medicines you take and about any drug or alcohol use. Get pain medicine prescriptions from only one health care provider. Pain can be managed with many types of alternative treatments. Ask your health care provider to refer you to one or more specialists who can help you manage pain through: Physical or occupational therapy. Counseling (cognitive behavioral  therapy). Good nutrition. Biofeedback. Massage. Meditation. Non-opioid medicine. Following a gentle exercise program. How to use opioid pain medicine Taking medicine Take your pain medicine exactly as told by your health care provider. Take it only when you need it. If your pain gets less severe, you may take less than your prescribed dose if your health care provider approves. If you are not having pain, do nottake pain medicine unless your health care provider tells you to take it. If your pain is severe, do nottry to treat it yourself by taking more pills than instructed on your prescription. Contact your health care provider for help. Write down the times when you take your pain medicine. It is easy to become confused while on pain medicine. Writing the time can help you avoid overdose. Take other over-the-counter or prescription medicines only as told by your health care provider. Keeping yourself and others safe  While you are taking opioid pain medicine: Do not drive, use machinery, or power tools. Do not sign legal documents. Do not drink alcohol. Do not take sleeping pills. Do not supervise children by yourself. Do not do activities that require climbing or being in high places. Do not go to a lake, river, ocean, spa, or swimming pool. Do not share your pain medicine with anyone. Keep pain medicine in a locked cabinet or in a secure area where pets and children cannot reach it. Stopping your use of opioids If you have been taking opioid medicine for more than a few weeks, you may need to slowly decrease (taper) how much you take until you stop completely. Tapering your use of opioids can decrease your risk of symptoms of withdrawal, such as: Pain and cramping in the abdomen. Nausea. Sweating. Sleepiness. Restlessness. Uncontrollable shaking (tremors). Cravings for the medicine. Do not attempt to taper your use of opioids on your own. Talk with your health care provider  about how to do this. Your health care provider may prescribe a step-down schedule based on how much medicine you are taking and how long you have been taking it. Getting rid of leftover pills Do not save any leftover pills. Get rid of leftover pills safely by: Taking the medicine to a prescription take-back program. This is usually offered by the county or law enforcement. Bringing them to a pharmacy that has a drug disposal container. Flushing them down the toilet. Check the label or package insert of your medicine to see whether this is safe to do. Throwing them out in the trash. Check the label or package insert of your medicine to see whether this is safe to do. If it is safe to throw it out, remove the medicine from the original container, put  it into a sealable bag or container, and mix it with used coffee grounds, food scraps, dirt, or cat litter before putting it in the trash. Follow these instructions at home: Activity Do exercises as told by your health care provider. Avoid activities that make your pain worse. Return to your normal activities as told by your health care provider. Ask your health care provider what activities are safe for you. General instructions You may need to take these actions to prevent or treat constipation: Drink enough fluid to keep your urine pale yellow. Take over-the-counter or prescription medicines. Eat foods that are high in fiber, such as beans, whole grains, and fresh fruits and vegetables. Limit foods that are high in fat and processed sugars, such as fried or sweet foods. Keep all follow-up visits. This is important. Where to find support If you have been taking opioids for a long time, you may benefit from receiving support for quitting from a local support group or counselor. Ask your health care provider for a referral to these resources in your area. Where to find more information Centers for Disease Control and Prevention (CDC):  http://www.wolf.info/ [place] Food and Drug Administration (FDA): [place] Get help right away if: You may have taken too much of an opioid (overdosed). Common symptoms of an overdose: Your breathing is slower or more shallow than normal. You have a very slow heartbeat (pulse). You have slurred speech. You have nausea and vomiting. Your pupils become very small. You have other potential symptoms: You are very confused. You faint or feel like you will faint. You have cold, clammy skin. You have blue lips or fingernails. You have thoughts of harming yourself or harming others. These symptoms may represent a serious problem that is an emergency. Do not wait to see if the symptoms will go away. Get medical help right away. Call your local emergency services (911 in the [place] Do not drive yourself to the hospital.  If you ever feel like you may hurt yourself or others, or have thoughts about taking your own life, get help right away. Go to your nearest emergency department or: Call your local emergency services (911 in the [place] Call the [facility] (734)026-6986 in the [place] Call a suicide crisis helpline, such as the [place] at (561) 162-6655 or 988 in the [place]. This is open 24 hours a day in the [place] Text the Crisis Text Line at 228-374-4376 (in the [place].). Summary Opioid medicines can help you manage moderate to severe pain for a short period of time. A pain treatment plan is an agreement between you and your health care provider. Discuss the goals of your treatment, including how much pain you might expect to have and how you will manage the pain. If you think that you or someone else may have taken too much of an opioid, get medical help right away. This information is not intended to replace advice given to you by your health care provider. Make sure you discuss any questions you have with your health care provider. Document Revised: 12/24/2020 Document  Reviewed: 09/10/2020 Elsevier Patient Education  [place] directives: Yes  Conditions/risks identified: None  Next appointment: Follow up in one year for your annual wellness visit     Preventive Care 65 Years and Older, Female Preventive care refers to lifestyle choices and visits with your health care provider that can promote health and wellness. What does preventive care include? A yearly physical  exam. This is also called an annual well check. Dental exams once or twice a year. Routine eye exams. Ask your health care provider how often you should have your eyes checked. Personal lifestyle choices, including: Daily care of your teeth and gums. Regular physical activity. Eating a healthy diet. Avoiding tobacco and drug use. Limiting alcohol use. Practicing safe sex. Taking low-dose aspirin every day. Taking vitamin and mineral supplements as recommended by your health care provider. What happens during an annual well check? The services and screenings done by your health care provider during your annual well check will depend on your age, overall health, lifestyle risk factors, and family history of disease. Counseling  Your health care provider may ask you questions about your: Alcohol use. Tobacco use. Drug use. Emotional well-being. Home and relationship well-being. Sexual activity. Eating habits. History of falls. Memory and ability to understand (cognition). Work and work Statistician. Reproductive health. Screening  You may have the following tests or measurements: Height, weight, and BMI. Blood pressure. Lipid and cholesterol levels. These may be checked every 5 years, or more frequently if you are over 39 years old. Skin check. Lung cancer screening. You may have this screening every year starting at age 46 if you have a 30-pack-year history of smoking and currently smoke or have quit within the past 15 years. Fecal occult blood test  (FOBT) of the stool. You may have this test every year starting at age 25. Flexible sigmoidoscopy or colonoscopy. You may have a sigmoidoscopy every 5 years or a colonoscopy every 10 years starting at age 35. Hepatitis C blood test. Hepatitis B blood test. Sexually transmitted disease (STD) testing. Diabetes screening. This is done by checking your blood sugar (glucose) after you have not eaten for a while (fasting). You may have this done every 1-3 years. Bone density scan. This is done to screen for osteoporosis. You may have this done starting at age 44. Mammogram. This may be done every 1-2 years. Talk to your health care provider about how often you should have regular mammograms. Talk with your health care provider about your test results, treatment options, and if necessary, the need for more tests. Vaccines  Your health care provider may recommend certain vaccines, such as: Influenza vaccine. This is recommended every year. Tetanus, diphtheria, and acellular pertussis (Tdap, Td) vaccine. You may need a Td booster every 10 years. Zoster vaccine. You may need this after age 41. Pneumococcal 13-valent conjugate (PCV13) vaccine. One dose is recommended after age 75. Pneumococcal polysaccharide (PPSV23) vaccine. One dose is recommended after age 74. Talk to your health care provider about which screenings and vaccines you need and how often you need them. This information is not intended to replace advice given to you by your health care provider. Make sure you discuss any questions you have with your health care provider. Document Released: 06/27/2015 Document Revised: 02/18/2016 Document Reviewed: 04/01/2015 Elsevier Interactive Patient Education  2017 [place] Prevention in the Home Falls can cause injuries. They can happen to people of all ages. There are many things you can do to make your home safe and to help prevent falls. What can I do on the outside of my  home? Regularly fix the edges of walkways and driveways and fix any cracks. Remove anything that might make you trip as you walk through a door, such as a raised step or threshold. Trim any bushes or trees on the path to your home. Use bright outdoor lighting.  Clear any walking paths of anything that might make someone trip, such as rocks or tools. Regularly check to see if handrails are loose or broken. Make sure that both sides of any steps have handrails. Any raised decks and porches should have guardrails on the edges. Have any leaves, snow, or ice cleared regularly. Use sand or salt on walking paths during winter. Clean up any spills in your garage right away. This includes oil or grease spills. What can I do in the bathroom? Use night lights. Install grab bars by the toilet and in the tub and shower. Do not use towel bars as grab bars. Use non-skid mats or decals in the tub or shower. If you need to sit down in the shower, use a plastic, non-slip stool. Keep the floor dry. Clean up any water that spills on the floor as soon as it happens. Remove soap buildup in the tub or shower regularly. Attach bath mats securely with double-sided non-slip rug tape. Do not have throw rugs and other things on the floor that can make you trip. What can I do in the bedroom? Use night lights. Make sure that you have a light by your bed that is easy to reach. Do not use any sheets or blankets that are too big for your bed. They should not hang down onto the floor. Have a firm chair that has side arms. You can use this for support while you get dressed. Do not have throw rugs and other things on the floor that can make you trip. What can I do in the kitchen? Clean up any spills right away. Avoid walking on wet floors. Keep items that you use a lot in easy-to-reach places. If you need to reach something above you, use a strong step stool that has a grab bar. Keep electrical cords out of the way. Do  not use floor polish or wax that makes floors slippery. If you must use wax, use non-skid floor wax. Do not have throw rugs and other things on the floor that can make you trip. What can I do with my stairs? Do not leave any items on the stairs. Make sure that there are handrails on both sides of the stairs and use them. Fix handrails that are broken or loose. Make sure that handrails are as long as the stairways. Check any carpeting to make sure that it is firmly attached to the stairs. Fix any carpet that is loose or worn. Avoid having throw rugs at the top or bottom of the stairs. If you do have throw rugs, attach them to the floor with carpet tape. Make sure that you have a light switch at the top of the stairs and the bottom of the stairs. If you do not have them, ask someone to add them for you. What else can I do to help prevent falls? Wear shoes that: Do not have high heels. Have rubber bottoms. Are comfortable and fit you well. Are closed at the toe. Do not wear sandals. If you use a stepladder: Make sure that it is fully opened. Do not climb a closed stepladder. Make sure that both sides of the stepladder are locked into place. Ask someone to hold it for you, if possible. Clearly mark and make sure that you can see: Any grab bars or handrails. First and last steps. Where the edge of each step is. Use tools that help you move around (mobility aids) if they are needed. These include: Canes. Walkers.  Scooters. Crutches. Turn on the lights when you go into a dark area. Replace any light bulbs as soon as they burn out. Set up your furniture so you have a clear path. Avoid moving your furniture around. If any of your floors are uneven, fix them. If there are any pets around you, be aware of where they are. Review your medicines with your doctor. Some medicines can make you feel dizzy. This can increase your chance of falling. Ask your doctor what other things that you can do to  help prevent falls. This information is not intended to replace advice given to you by your health care provider. Make sure you discuss any questions you have with your health care provider. Document Released: 03/27/2009 Document Revised: 11/06/2015 Document Reviewed: 07/05/2014 Elsevier Interactive Patient Education  2017 [employer].

## 2022-02-02 DIAGNOSIS — M25561 Pain in right knee: Secondary | ICD-10-CM | POA: Diagnosis not present

## 2022-02-09 ENCOUNTER — Ambulatory Visit (INDEPENDENT_AMBULATORY_CARE_PROVIDER_SITE_OTHER): Payer: Medicare Other

## 2022-02-09 DIAGNOSIS — E538 Deficiency of other specified B group vitamins: Secondary | ICD-10-CM

## 2022-02-09 MED ORDER — CYANOCOBALAMIN 1000 MCG/ML IJ SOLN
1000.0000 ug | Freq: Once | INTRAMUSCULAR | Status: AC
  Administered 2022-02-09: 1000 ug via INTRAMUSCULAR

## 2022-02-09 NOTE — Progress Notes (Signed)
Pt here for monthly B12 injection per Dr [place].  B12 1059mg given IM right deltoid, and pt tolerated injection well.  Next B12 injection scheduled for next month.

## 2022-02-10 ENCOUNTER — Other Ambulatory Visit: Payer: Self-pay | Admitting: Internal Medicine

## 2022-02-10 DIAGNOSIS — M722 Plantar fascial fibromatosis: Secondary | ICD-10-CM | POA: Diagnosis not present

## 2022-02-10 DIAGNOSIS — M25512 Pain in left shoulder: Secondary | ICD-10-CM | POA: Diagnosis not present

## 2022-02-10 DIAGNOSIS — M0589 Other rheumatoid arthritis with rheumatoid factor of multiple sites: Secondary | ICD-10-CM | POA: Diagnosis not present

## 2022-02-10 DIAGNOSIS — S86012D Strain of left Achilles tendon, subsequent encounter: Secondary | ICD-10-CM | POA: Diagnosis not present

## 2022-02-10 DIAGNOSIS — Z6824 Body mass index (BMI) 24.0-24.9, adult: Secondary | ICD-10-CM | POA: Diagnosis not present

## 2022-02-10 DIAGNOSIS — M810 Age-related osteoporosis without current pathological fracture: Secondary | ICD-10-CM

## 2022-02-10 DIAGNOSIS — G894 Chronic pain syndrome: Secondary | ICD-10-CM | POA: Diagnosis not present

## 2022-02-10 DIAGNOSIS — M47812 Spondylosis without myelopathy or radiculopathy, cervical region: Secondary | ICD-10-CM | POA: Diagnosis not present

## 2022-02-10 DIAGNOSIS — M1991 Primary osteoarthritis, unspecified site: Secondary | ICD-10-CM | POA: Diagnosis not present

## 2022-02-10 DIAGNOSIS — Z79899 Other long term (current) drug therapy: Secondary | ICD-10-CM | POA: Diagnosis not present

## 2022-03-02 DIAGNOSIS — M79661 Pain in right lower leg: Secondary | ICD-10-CM | POA: Diagnosis not present

## 2022-03-02 DIAGNOSIS — M5451 Vertebrogenic low back pain: Secondary | ICD-10-CM | POA: Diagnosis not present

## 2022-03-12 ENCOUNTER — Ambulatory Visit (INDEPENDENT_AMBULATORY_CARE_PROVIDER_SITE_OTHER): Payer: Medicare Other

## 2022-03-12 DIAGNOSIS — E538 Deficiency of other specified B group vitamins: Secondary | ICD-10-CM

## 2022-03-12 MED ORDER — CYANOCOBALAMIN 1000 MCG/ML IJ SOLN
1000.0000 ug | Freq: Once | INTRAMUSCULAR | Status: AC
  Administered 2022-03-12: 1000 ug via INTRAMUSCULAR

## 2022-03-12 NOTE — Progress Notes (Signed)
Pt here for monthly B12 injection per Dr Martinique.  B12 1027mg given IM, and pt tolerated injection well.  Next B12 injection scheduled for next month.

## 2022-03-22 NOTE — Progress Notes (Signed)
HPI: Ms.Ikesha L Gunderman is a 87 y.o. female with medical history significant for HTN,CKD III,RA,atrial fib, OA, and hypothyroidism here today for chronic disease management.  Last seen on 09/23/21. She was evaluated in the ED on 12/29/21 for another fall.She fell in her kitchen, no head trauma or LOC. Tibial/fibular X ray 1. Nondisplaced fracture of the fibular neck. 2. Superior positioning of the patella with fragmented ossification of the patellar tendon. 3. Total right knee arthroplasty appears intact.  Right knee CT on 12/29/21:Total knee arthroplasty changes are noted without evidence of hardware loosening. There is a nondisplaced fracture of the fibular neck. A nondisplaced transverse fracture of the proximal tibial metadiaphysis which abuts the distal portion of the femoral component. Conservative treatment with short knee cast for a few weeks. She has a f/u appt later this month. Pain and edema have improved. She is using a cane for assistance.  Hypertension:  Medications:Avapro 150 mg daily. BP readings at home: Not checking lately. Negative for unusual or severe headache, visual changes, exertional chest pain, dyspnea, or focal weakness. An episode of dizziness after prolonged standing while cooking dinner a few days ago, no associated symptoms and resolved when she sat down.  CKD III: She has not noted gross hematuria,foam in urine,or decreased urine output. Atrial fib: She is not longer on Eliquis, stopped in 12/2017.  Lab Results  Component Value Date   CREATININE 1.20 09/23/2021   BUN 18 09/23/2021   NA 140 09/23/2021   K 4.0 09/23/2021   CL 105 09/23/2021   CO2 26 09/23/2021   Lab Results  Component Value Date   MICROALBUR 1.0 08/23/2018   MICROALBUR 1.4 08/24/2017   RA and generalized OA: She had labs at her rheumatologist's office 02/2022 and states that she is supposed to have labs there regularly, she is on Embrel. She is not sure about next visit with  rheumatologist. States that provider is not longer prescribing Hydrocodone-Acetaminophen 5-325 mg , which she has been taking for years and helps with chronic pain. She  can take medication tid prn but most of the time she takes Hydrocodone-Acetaminophen bid. She is not sure who will continue prescribing medication, she tried to arrange an appt with pain management, she is very frustrated about this.  Sometimes she has mild constipation, she takes a Colace daily, which has helped.  Review of Systems  Constitutional:  Negative for activity change, appetite change and fever.  HENT:  Negative for mouth sores and nosebleeds.   Respiratory:  Negative for cough and wheezing.   Gastrointestinal:  Negative for abdominal pain, nausea and vomiting.       Negative for changes in bowel habits.  Endocrine: Negative for cold intolerance and heat intolerance.  Genitourinary:  Negative for decreased urine volume, dysuria and hematuria.  Musculoskeletal:  Positive for arthralgias, back pain and gait problem.  Neurological:  Negative for syncope, facial asymmetry and weakness.  Rest of ROS: see pertinent positives and negatives in HPI.  Current Outpatient Medications on File Prior to Visit  Medication Sig Dispense Refill   amoxicillin (AMOXIL) 500 MG tablet Take 4 tablet by mouth   BEFORE DENTAL PROCEDURE.     Cholecalciferol (VITAMIN D3) 5000 UNITS TABS Take 5,000 tablets by mouth daily.     cyanocobalamin (,VITAMIN B-12,) 1000 MCG/ML injection INJECT 1 ML EVERY 3 WEEKS. (Patient taking differently: Inject 1,000 mcg into the muscle. Every 4 weeks.) 4 mL 0   docusate sodium (COLACE) 100 MG capsule Take 1 capsule (  100 mg total) by mouth 2 (two) times daily. (Patient taking differently: Take 100 mg by mouth daily.) 10 capsule 0   etanercept (ENBREL) 50 MG/ML injection Inject 50 mg into the skin once a week. Thursdays     folic acid (FOLVITE) 1 MG tablet Take 1 mg by mouth daily.     HYDROcodone-acetaminophen  (NORCO/VICODIN) 5-325 MG tablet Take 1 tablet by mouth every 8 (eight) hours as needed for moderate pain.     irbesartan (AVAPRO) 150 MG tablet TAKE ONE TABLET BY MOUTH DAILY. 90 tablet 3   levothyroxine (SYNTHROID) 50 MCG tablet TAKE ONE TABLET IN THE MORNING ON AN EMPTY STOMACH. 90 tablet 3   methotrexate (RHEUMATREX) 2.5 MG tablet Take 20 mg by mouth every Monday.      nitrofurantoin (MACRODANTIN) 100 MG capsule Take 100 mg by mouth daily. Urologist.     pantoprazole (PROTONIX) 20 MG tablet Take 1 tablet (20 mg total) by mouth daily. 30 tablet 3   zoledronic acid (RECLAST) 5 MG/100ML SOLN injection Inject 5 mg into the vein once.     clotrimazole-betamethasone (LOTRISONE) cream Apply 1 application. topically daily. 30 g 1   No current facility-administered medications on file prior to visit.   Past Medical History:  Diagnosis Date   Anemia    Arthritis    Rhuematoid-takes Enbrel and Methorexate    Back pain    states in Jan 2017 spinal was attempted 4 times and has had back pain since.   Cancer ([place])    skin cancer on head   Chicken pox    Diverticulosis    History of colon polyps    benign   Hypertension    takes Avapro daily   Hypothyroidism    takes Synthroid daily   Joint pain    Pneumonia    at age 40   PONV (postoperative nausea and vomiting)    the patch helps!   Urinary frequency    Urinary urgency    was taking Myrbetriq but stopped.Plans to start back   Allergies  Allergen Reactions   Oxycodone Other (See Comments)    Makes hallucinates   Sulfamethoxazole Nausea Only    Social History   Socioeconomic History   Marital status: Married    Spouse name: Not on file   Number of children: 2   Years of education: Not on file   Highest education level: Not on file  Occupational History   Occupation: Retired  Tobacco Use   Smoking status: Former    Types: Cigarettes    Quit date: 06/14/1978    Years since quitting: 43.8   Smokeless tobacco: Never    Tobacco comments:    quit 32 yrs ago  Vaping Use   Vaping Use: Never used  Substance and Sexual Activity   Alcohol use: Yes    Comment: occ   Drug use: No   Sexual activity: Never  Other Topics Concern   Not on file  Social History Narrative   She lives in [place] with her husband.   Social Determinants of Health   Financial Resource Strain: Low Risk  (01/22/2022)   Overall Financial Resource Strain (CARDIA)    Difficulty of Paying Living Expenses: Not hard at all  Food Insecurity: No Food Insecurity (01/22/2022)   Hunger Vital Sign    Worried About Running Out of Food in the Last Year: Never true    Ran Out of Food in the Last Year: Never true  Transportation Needs: No  Transportation Needs (01/22/2022)   PRAPARE - Hydrologist (Medical): No    Lack of Transportation (Non-Medical): No  Physical Activity: Sufficiently Active (01/22/2022)   Exercise Vital Sign    Days of Exercise per Week: 5 days    Minutes of Exercise per Session: 30 min  Stress: No Stress Concern Present (01/22/2022)   Sunflower    Feeling of Stress : Not at all  Social Connections: [place] (01/22/2022)   Social Connection and Isolation Panel [NHANES]    Frequency of Communication with Friends and Family: More than three times a week    Frequency of Social Gatherings with Friends and Family: More than three times a week    Attends Religious Services: More than 4 times per year    Active Member of [employer] or Organizations: Yes    Attends Archivist Meetings: More than 4 times per year    Marital Status: Married   Vitals:   03/23/22 1417  BP: 101/80  Pulse: 80  Resp: 18  Temp: 97.8 F (36.6 C)  SpO2: 97%   Body mass index is 24.25 kg/m.  Physical Exam Vitals and nursing note reviewed.  Constitutional:      General: She is not in acute distress.    Appearance: She is well-developed  and well-groomed.  HENT:     Head: Normocephalic and atraumatic.     Mouth/Throat:     Mouth: Mucous membranes are moist.     Pharynx: Oropharynx is clear.  Eyes:     Conjunctiva/sclera: Conjunctivae normal.  Cardiovascular:     Rate and Rhythm: Normal rate and regular rhythm.     Pulses:          Dorsalis pedis pulses are 2+ on the right side and 2+ on the left side.     Heart sounds: No murmur heard.    Comments: 1+ pitting RLE and pedal edema. No erythema or calf pain with palpation. Pulmonary:     Effort: Pulmonary effort is normal. No respiratory distress.     Breath sounds: Normal breath sounds.  Abdominal:     Palpations: Abdomen is soft. There is no hepatomegaly or mass.     Tenderness: There is no abdominal tenderness.  Musculoskeletal:     Right knee: Decreased range of motion.     Comments: Muscle atrophy around right knee.  Lymphadenopathy:     Cervical: No cervical adenopathy.  Skin:    General: Skin is warm.     Findings: No erythema or rash.  Neurological:     General: No focal deficit present.     Mental Status: She is alert and oriented to person, place, and time.     Cranial Nerves: No cranial nerve deficit.     Comments: Antalgic gait assisted with a cane.  Psychiatric:        Mood and Affect: Mood and affect normal.   ASSESSMENT AND PLAN:  Ms.Palmina was seen today for follow-up.  Diagnoses and all orders for this visit: Orders Placed This Encounter  Procedures   Flu Vaccine QUAD High Dose(Fluad)   Basic metabolic panel   Microalbumin / creatinine urine ratio   Lab Results  Component Value Date   CREATININE 0.94 03/23/2022   BUN 18 03/23/2022   NA 138 03/23/2022   K 4.0 03/23/2022   CL 104 03/23/2022   CO2 25 03/23/2022   Lab Results  Component Value Date  MICROALBUR <0.7 03/23/2022   MICROALBUR 1.0 08/23/2018   Chronic pain disorder She has tolerated Hydrocodone-Acetaminophen sell, I will be taking over prescription. PAP reviewed and  there is no record of her last refill, she is going to call to let me know how may tabs she has left. Will prescribed Hydrocodone-Acetaminophen 5-325 mg  We discussed some guidelines in regard to chronic opioid use for chronic pain. Will plan on signing med contract next visit.  Closed fracture of distal end of right fibula with routine healing, unspecified fracture morphology, subsequent encounter 12/2021. Recovering well. Fall precautions discussed. Using a cane for assistance, may need a walker. Hoping to start PT. Has f/u appt with ortho 04/13/22.  Need for immunization against influenza -     Flu Vaccine QUAD High Dose(Fluad)  Paroxysmal atrial fibrillation (HCC) Rhythm and rate controlled. Asymptomatic. She is not longer on chronic anticoagulation, stopped Eliquis in 2019.  Hypertension, essential, benign Today SBP mildly low. For now continue Avapro 75 mg daily and low salt diet. Monitor BP at home regularly.  CKD (chronic kidney disease), stage III (HCC) Cr 0.9-1.0, last one 1.2 and e GFR in the low 50's (53), last one 41.  Continue low salt diet, hydration,and avoidance of NSAID's. Adequate BP controlled. Continue Avapro.  I spent a total of 40 minutes in both face to face and non face to face activities for this visit on the date of this encounter. During this time history was obtained and documented, examination was performed, prior labs/imaging reviewed, and assessment/plan discussed.  Return in about 16 weeks (around 07/13/2022).  Nyxon Strupp G. Martinique, MD  [facility]. [place] office.

## 2022-03-23 ENCOUNTER — Ambulatory Visit (INDEPENDENT_AMBULATORY_CARE_PROVIDER_SITE_OTHER): Payer: Medicare Other | Admitting: Family Medicine

## 2022-03-23 ENCOUNTER — Encounter: Payer: Self-pay | Admitting: Family Medicine

## 2022-03-23 ENCOUNTER — Telehealth: Payer: Self-pay | Admitting: Family Medicine

## 2022-03-23 VITALS — BP 101/80 | HR 80 | Temp 97.8°F | Resp 18 | Ht 64.0 in | Wt 141.2 lb

## 2022-03-23 DIAGNOSIS — N1831 Chronic kidney disease, stage 3a: Secondary | ICD-10-CM | POA: Diagnosis not present

## 2022-03-23 DIAGNOSIS — I10 Essential (primary) hypertension: Secondary | ICD-10-CM | POA: Diagnosis not present

## 2022-03-23 DIAGNOSIS — G894 Chronic pain syndrome: Secondary | ICD-10-CM

## 2022-03-23 DIAGNOSIS — S82831D Other fracture of upper and lower end of right fibula, subsequent encounter for closed fracture with routine healing: Secondary | ICD-10-CM | POA: Diagnosis not present

## 2022-03-23 DIAGNOSIS — Z23 Encounter for immunization: Secondary | ICD-10-CM | POA: Diagnosis not present

## 2022-03-23 DIAGNOSIS — I480 Paroxysmal atrial fibrillation: Secondary | ICD-10-CM

## 2022-03-23 LAB — BASIC METABOLIC PANEL
BUN: 18 mg/dL (ref 6–23)
CO2: 25 mEq/L (ref 19–32)
Calcium: 9.6 mg/dL (ref 8.4–10.5)
Chloride: 104 mEq/L (ref 96–112)
Creatinine, Ser: 0.94 mg/dL (ref 0.40–1.20)
GFR: 55.53 mL/min — ABNORMAL LOW (ref >60.00)
Glucose, Bld: 101 mg/dL — ABNORMAL HIGH (ref 70–99)
Potassium: 4 mEq/L (ref 3.5–5.1)
Sodium: 138 mEq/L (ref 135–145)

## 2022-03-23 LAB — MICROALBUMIN / CREATININE URINE RATIO
Creatinine,U: 97.6 mg/dL
Microalb Creat Ratio: 0.7 mg/g (ref 0.0–30.0)
Microalb, Ur: <0.7 mg/dL (ref 0.0–1.9)

## 2022-03-23 NOTE — Assessment & Plan Note (Addendum)
Today SBP mildly low. For now continue Avapro 75 mg daily and low salt diet. Monitor BP at home regularly.

## 2022-03-23 NOTE — Patient Instructions (Addendum)
A few things to remember from today's visit:  Hypertension, essential, benign - Plan: Basic metabolic panel  Stage 3a chronic kidney disease ([place]) - Plan: Basic metabolic panel, Microalbumin / creatinine urine ratio  Closed fracture of distal end of right fibula with routine healing, unspecified fracture morphology, subsequent encounter  Chronic pain disorder  I will be taking over Hydrocodone-Acetaminophen , will prescribed 70 tab per month. Please call to let me know how many tabs you have. No changes in rest today. Monitor blood pressure, it was mildly low today.  If you need refills for medications you take chronically, please call your pharmacy. Do not use My Chart to request refills or for acute issues that need immediate attention. If you send a my chart message, it may take a few days to be addressed, specially if I am not in the office.  Please be sure medication list is accurate. If a new problem present, please set up appointment sooner than planned today.

## 2022-03-23 NOTE — Telephone Encounter (Signed)
Pt was seen today and is calling back to let md know she has #36 hydrocodone 5-325 mg pill left and pt states dr Martinique will took over prescribing this medication  [place], [place] Phone:  [phone]  Fax:  [phone]

## 2022-03-23 NOTE — Assessment & Plan Note (Signed)
Cr 0.9-1.0, last one 1.2 and e GFR in the low 50's (53), last one 41.  Continue low salt diet, hydration,and avoidance of NSAID's. Adequate BP controlled. Continue Avapro.

## 2022-03-30 DIAGNOSIS — R350 Frequency of micturition: Secondary | ICD-10-CM | POA: Diagnosis not present

## 2022-04-07 ENCOUNTER — Other Ambulatory Visit: Payer: Self-pay | Admitting: Family Medicine

## 2022-04-07 MED ORDER — HYDROCODONE-ACETAMINOPHEN 5-325 MG PO TABS: 1.0000 | ORAL_TABLET | Freq: Three times a day (TID) | ORAL | 0 refills | Status: DC | PRN

## 2022-04-07 NOTE — Telephone Encounter (Addendum)
Pt called requesting to speak with CMA. Pt was asked if call was regarding a medication refill, as I could help with that.  Pt declined to tell me the reason for the call and would rather speak directly to Starr.

## 2022-04-07 NOTE — Telephone Encounter (Signed)
I called and spoke with patient. She is due for her hydrocodone refill on Saturday. Rx pended and sent to PCP.

## 2022-04-09 ENCOUNTER — Ambulatory Visit (INDEPENDENT_AMBULATORY_CARE_PROVIDER_SITE_OTHER): Payer: Medicare Other

## 2022-04-09 DIAGNOSIS — E538 Deficiency of other specified B group vitamins: Secondary | ICD-10-CM | POA: Diagnosis not present

## 2022-04-09 MED ORDER — CYANOCOBALAMIN 1000 MCG/ML IJ SOLN
1000.0000 ug | Freq: Once | INTRAMUSCULAR | Status: AC
  Administered 2022-04-09: 1000 ug via INTRAMUSCULAR

## 2022-04-09 NOTE — Progress Notes (Signed)
Per orders of Dr. Volanda Napoleon, injection of cyanocobalamin 1000 mcg given by Encarnacion Slates.   Patient tolerated injection well.

## 2022-04-13 DIAGNOSIS — M79671 Pain in right foot: Secondary | ICD-10-CM | POA: Diagnosis not present

## 2022-04-13 DIAGNOSIS — M25561 Pain in right knee: Secondary | ICD-10-CM | POA: Diagnosis not present

## 2022-04-16 ENCOUNTER — Telehealth: Payer: Self-pay | Admitting: Pharmacist

## 2022-04-16 NOTE — Chronic Care Management (AMB) (Unsigned)
Chronic Care Management Pharmacy Assistant   Name: Jennifer Goodman  MRN: [id number] DOB: [birth year]  Reason for Encounter: Disease State/ Hypertension Assessment Call   Conditions to be addressed/monitored: HTN  Recent office visits:  03/23/2022 Betty Martinique MD - Patient was seen for chronic pain disorder and additional concerns. Discontinued Lotrisone cream. Follow up in 16 weeks.   01/22/2022 Rolene Arbour LPN - Medicare annual wellness exam   01/07/2022 Lelon Frohlich MD - Patient was seen for  Deficiency of other specified B group vitamins . No additional chart notes.   Recent consult visits:  01/12/2022 Dorna Leitz (orthopedic surgery) - Patient was seen for pain in right knee. No additional chart notes.   Hospital visits:  None  Medications: Outpatient Encounter Medications as of 04/16/2022  Medication Sig   amoxicillin (AMOXIL) 500 MG tablet Take 4 tablet by mouth   BEFORE DENTAL PROCEDURE.   Cholecalciferol (VITAMIN D3) 5000 UNITS TABS Take 5,000 tablets by mouth daily.   cyanocobalamin (,VITAMIN B-12,) 1000 MCG/ML injection INJECT 1 ML EVERY 3 WEEKS. (Patient taking differently: Inject 1,000 mcg into the muscle. Every 4 weeks.)   docusate sodium (COLACE) 100 MG capsule Take 1 capsule (100 mg total) by mouth 2 (two) times daily. (Patient taking differently: Take 100 mg by mouth daily.)   etanercept (ENBREL) 50 MG/ML injection Inject 50 mg into the skin once a week. Thursdays   folic acid (FOLVITE) 1 MG tablet Take 1 mg by mouth daily.   HYDROcodone-acetaminophen (NORCO/VICODIN) 5-325 MG tablet Take 1 tablet by mouth every 8 (eight) hours as needed for moderate pain. Max 70 tabs per month.   irbesartan (AVAPRO) 150 MG tablet TAKE ONE TABLET BY MOUTH DAILY.   levothyroxine (SYNTHROID) 50 MCG tablet TAKE ONE TABLET IN THE MORNING ON AN EMPTY STOMACH.   methotrexate (RHEUMATREX) 2.5 MG tablet Take 20 mg by mouth every Monday.    nitrofurantoin (MACRODANTIN) 100 MG  capsule Take 100 mg by mouth daily. Urologist.   pantoprazole (PROTONIX) 20 MG tablet Take 1 tablet (20 mg total) by mouth daily.   zoledronic acid (RECLAST) 5 MG/100ML SOLN injection Inject 5 mg into the vein once.   No facility-administered encounter medications on file as of 04/16/2022.  Fill History:   Dispensed Days Supply Quantity Provider Pharmacy  folic acid 1 mg tablet 04/08/2021    No Pharmacy Name  FOLIC ACID   TAB [id number] 07/21/2019 30       Dispensed Days Supply Quantity Provider Pharmacy  hydrocodone-acetaminophen 5-325 mg tablet 01/12/2022   Dorna Leitz, MD [place]...  hydrocodone-acetaminophen 5-300 mg tablet 04/08/2021    No Pharmacy Name  hydrocodone-acetaminophen 5-325 mg tablet 04/08/2021    No Pharmacy Name  HYDROCO/APAP TAB 5-'325MG'$  07/27/2019 30       Dispensed Days Supply Quantity Provider Pharmacy  irbesartan 150 mg tablet 04/08/2021    No Pharmacy Name  IRBESARTAN   TAB '150MG'$  07/07/2019 90       Dispensed Days Supply Quantity Provider Pharmacy  levothyroxine 100 mcg capsule 04/08/2021    No Pharmacy Name  LEVOTHYROXIN TAB 25MCG 07/14/2019 90       Dispensed Days Supply Quantity Provider Pharmacy  METHOTREXATE TAB 2.'5MG'$  07/21/2019 28      Reviewed chart prior to disease state call. Spoke with patient regarding BP  Recent Office Vitals: BP Readings from Last 3 Encounters:  03/23/22 101/80  12/29/21 (!) 151/78  09/23/21 126/70   Pulse Readings from Last 3  Encounters:  03/23/22 80  12/29/21 79  09/23/21 75    Wt Readings from Last 3 Encounters:  03/23/22 141 lb 4 oz (64.1 kg)  01/22/22 149 lb (67.6 kg)  12/29/21 147 lb 11.3 oz (67 kg)     Kidney Function Lab Results  Component Value Date/Time   CREATININE 0.94 03/23/2022 02:52 PM   CREATININE 1.20 09/23/2021 11:06 AM   GFR 55.53 (L) 03/23/2022 02:52 PM   GFRNONAA 47 (L) 11/19/2017 08:09 AM   GFRAA 54 (L) 11/19/2017 08:09 AM       Latest Ref Rng & Units 03/23/2022    2:52 PM  09/23/2021   11:06 AM 09/12/2020    9:35 AM  BMP  Glucose 70 - 99 mg/dL 101  92  99   BUN 6 - 23 mg/dL '18  18  21   '$ Creatinine 0.40 - 1.20 mg/dL 0.94  1.20  0.98   Sodium 135 - 145 mEq/L 138  140  140   Potassium 3.5 - 5.1 mEq/L 4.0  4.0  4.5   Chloride 96 - 112 mEq/L 104  105  104   CO2 19 - 32 mEq/L '25  26  27   '$ Calcium 8.4 - 10.5 mg/dL 9.6  9.8  10.1    SCHED FU FEB Current antihypertensive regimen:  Irbesartan 150 mg daily  How often are you checking your Blood Pressure? {CHL HP BP Monitoring Frequency:[phone]}  Current home BP readings: ***  What recent interventions/DTPs have been made by any provider to improve Blood Pressure control since last CPP Visit: No recent interventions  Any recent hospitalizations or ED visits since last visit with CPP? No recent hospital visits.   What diet changes have been made to improve Blood Pressure Control?  Patient eats light meals  Breakfast - patient will have english muffin and coffee Lunch - patient will have sandwich or leftovers Dinner - patient will have a meal with meat sometimes and vegetables  What exercise is being done to improve your Blood Pressure Control?  Patient walks some and around her house   Adherence Review: Is the patient currently on ACE/ARB medication? Yes Does the patient have >5 day gap between last estimated fill dates? No  Care Gaps: AWV - scheduled 01/26/2023 Last BP - 101/80 on 03/23/2022 Covid - overdue  Star Rating Drugs: Irbesartan '150mg'$  - last filled 03/22/2022 90 DS at [facility] verified with pharmacist   [place] Pharmacist Assistant [phone]

## 2022-05-10 ENCOUNTER — Ambulatory Visit (INDEPENDENT_AMBULATORY_CARE_PROVIDER_SITE_OTHER): Payer: Medicare Other

## 2022-05-10 DIAGNOSIS — E538 Deficiency of other specified B group vitamins: Secondary | ICD-10-CM

## 2022-05-10 MED ORDER — CYANOCOBALAMIN 1000 MCG/ML IJ SOLN
1000.0000 ug | Freq: Once | INTRAMUSCULAR | Status: AC
  Administered 2022-05-10: 1000 ug via INTRAMUSCULAR

## 2022-05-10 NOTE — Progress Notes (Signed)
Pt here for monthly B12 injection per Dr. Jordan.  B12 1000mcg given IM, and pt tolerated injection well.  

## 2022-05-13 DIAGNOSIS — Z85828 Personal history of other malignant neoplasm of skin: Secondary | ICD-10-CM | POA: Diagnosis not present

## 2022-05-13 DIAGNOSIS — D225 Melanocytic nevi of trunk: Secondary | ICD-10-CM | POA: Diagnosis not present

## 2022-05-13 DIAGNOSIS — L700 Acne vulgaris: Secondary | ICD-10-CM | POA: Diagnosis not present

## 2022-05-13 DIAGNOSIS — L821 Other seborrheic keratosis: Secondary | ICD-10-CM | POA: Diagnosis not present

## 2022-05-13 DIAGNOSIS — L570 Actinic keratosis: Secondary | ICD-10-CM | POA: Diagnosis not present

## 2022-05-13 DIAGNOSIS — D1801 Hemangioma of skin and subcutaneous tissue: Secondary | ICD-10-CM | POA: Diagnosis not present

## 2022-05-13 DIAGNOSIS — L820 Inflamed seborrheic keratosis: Secondary | ICD-10-CM | POA: Diagnosis not present

## 2022-05-14 ENCOUNTER — Other Ambulatory Visit: Payer: Self-pay | Admitting: Family Medicine

## 2022-05-14 ENCOUNTER — Telehealth: Payer: Self-pay | Admitting: Family Medicine

## 2022-05-14 MED ORDER — HYDROCODONE-ACETAMINOPHEN 5-325 MG PO TABS: 1.0000 | ORAL_TABLET | Freq: Three times a day (TID) | ORAL | 0 refills | Status: DC | PRN

## 2022-05-14 NOTE — Telephone Encounter (Signed)
Refill of HYDROcodone-acetaminophen (NORCO/VICODIN) 5-325 MG tablet  [place], [place] Phone: [phone]  Fax: [phone]    Says provider told her to call in and request a call back.Marland Kitchen

## 2022-05-14 NOTE — Telephone Encounter (Signed)
Pt called back to FU on this refill request.  Pt is completely out and is asking that Rx PLEASE be sent as soon as possible as she cannot spend the entire weekend without it.

## 2022-05-17 NOTE — Telephone Encounter (Signed)
Rx was sent in Friday, 12/1 at 5:30pm with receipt showing confirmation. I called and spoke with patient. She will double check with the pharmacy.

## 2022-05-17 NOTE — Telephone Encounter (Signed)
This one just needs to be denied.

## 2022-05-17 NOTE — Telephone Encounter (Signed)
Pt called checking on progress of this refill. Upset it has not been filled at this point. Reminded of 72 hours we ask to allow a refill to be called in. Patient unhappy and line went silent as I was explaining to patient.

## 2022-06-03 ENCOUNTER — Ambulatory Visit (INDEPENDENT_AMBULATORY_CARE_PROVIDER_SITE_OTHER): Payer: Medicare Other

## 2022-06-03 DIAGNOSIS — E538 Deficiency of other specified B group vitamins: Secondary | ICD-10-CM | POA: Diagnosis not present

## 2022-06-03 MED ORDER — CYANOCOBALAMIN 1000 MCG/ML IJ SOLN
1000.0000 ug | Freq: Once | INTRAMUSCULAR | Status: AC
  Administered 2022-06-03: 1000 ug via INTRAMUSCULAR

## 2022-06-03 NOTE — Progress Notes (Signed)
Pt here for monthly B12 injection per Dr Martinique  B12 1061mg given IM, and pt tolerated injection well.  Next B12 injection scheduled for 07/04/21

## 2022-06-17 ENCOUNTER — Other Ambulatory Visit: Payer: Self-pay | Admitting: Family Medicine

## 2022-06-17 ENCOUNTER — Telehealth: Payer: Self-pay | Admitting: Pharmacist

## 2022-06-17 NOTE — Telephone Encounter (Signed)
Last refill per [facility] 05/14/22 Last OV 03/23/22 Next OV none scheduled

## 2022-06-17 NOTE — Progress Notes (Unsigned)
Care Management & Coordination Services Pharmacy Team  Name: Jennifer Goodman  MRN: [id number] DOB: 08/28/1934  Reason for Encounter: Hypertension  Contacted patient on *** to discuss hypertension disease state.   Recent office visits:  None  Recent consult visits:  None  Hospital visits:  None  Medications: Outpatient Encounter Medications as of 06/17/2022  Medication Sig   amoxicillin (AMOXIL) 500 MG tablet Take 4 tablet by mouth   BEFORE DENTAL PROCEDURE.   Cholecalciferol (VITAMIN D3) 5000 UNITS TABS Take 5,000 tablets by mouth daily.   cyanocobalamin (,VITAMIN B-12,) 1000 MCG/ML injection INJECT 1 ML EVERY 3 WEEKS. (Patient taking differently: Inject 1,000 mcg into the muscle. Every 4 weeks.)   docusate sodium (COLACE) 100 MG capsule Take 1 capsule (100 mg total) by mouth 2 (two) times daily. (Patient taking differently: Take 100 mg by mouth daily.)   etanercept (ENBREL) 50 MG/ML injection Inject 50 mg into the skin once a week. Thursdays   folic acid (FOLVITE) 1 MG tablet Take 1 mg by mouth daily.   HYDROcodone-acetaminophen (NORCO/VICODIN) 5-325 MG tablet Take 1 tablet by mouth every 8 (eight) hours as needed for moderate pain. Max 70 tabs per month.   irbesartan (AVAPRO) 150 MG tablet TAKE ONE TABLET BY MOUTH DAILY.   levothyroxine (SYNTHROID) 50 MCG tablet TAKE ONE TABLET IN THE MORNING ON AN EMPTY STOMACH.   methotrexate (RHEUMATREX) 2.5 MG tablet Take 20 mg by mouth every Monday.    nitrofurantoin (MACRODANTIN) 100 MG capsule Take 100 mg by mouth daily. Urologist.   pantoprazole (PROTONIX) 20 MG tablet Take 1 tablet (20 mg total) by mouth daily.   zoledronic acid (RECLAST) 5 MG/100ML SOLN injection Inject 5 mg into the vein once.   No facility-administered encounter medications on file as of 06/17/2022.   Fill History:    Dispensed Days Supply Quantity Provider Pharmacy  irbesartan 150 mg tablet 04/08/2021    No Pharmacy Name  IRBESARTAN   TAB '150MG'$  07/07/2019 90 90 each       Dispensed Days Supply Quantity Provider Pharmacy  levothyroxine 100 mcg capsule 04/08/2021    No Pharmacy Name  LEVOTHYROXIN TAB 25MCG 07/14/2019 90 90 each      Dispensed Days Supply Quantity Provider Pharmacy  METHOTREXATE TAB 2.'5MG'$  07/21/2019 28 32 each      Dispensed Days Supply Quantity Provider Pharmacy  hydrocodone-acetaminophen 5-325 mg tablet 01/12/2022   Dorna Leitz, MD [place]...  hydrocodone-acetaminophen 5-300 mg tablet 04/08/2021    No Pharmacy Name  hydrocodone-acetaminophen 5-325 mg tablet 04/08/2021    No Pharmacy Name  HYDROCO/APAP TAB 5-'325MG'$  07/27/2019 30 90 each       Recent Office Vitals: BP Readings from Last 3 Encounters:  03/23/22 101/80  12/29/21 (!) 151/78  09/23/21 126/70   Pulse Readings from Last 3 Encounters:  03/23/22 80  12/29/21 79  09/23/21 75    Wt Readings from Last 3 Encounters:  03/23/22 141 lb 4 oz (64.1 kg)  01/22/22 149 lb (67.6 kg)  12/29/21 147 lb 11.3 oz (67 kg)     Kidney Function Lab Results  Component Value Date/Time   CREATININE 0.94 03/23/2022 02:52 PM   CREATININE 1.20 09/23/2021 11:06 AM   GFR 55.53 (L) 03/23/2022 02:52 PM   GFRNONAA 47 (L) 11/19/2017 08:09 AM   GFRAA 54 (L) 11/19/2017 08:09 AM       Latest Ref Rng & Units 03/23/2022    2:52 PM 09/23/2021   11:06 AM 09/12/2020  9:35 AM  BMP  Glucose 70 - 99 mg/dL 101  92  99   BUN 6 - 23 mg/dL '18  18  21   '$ Creatinine 0.40 - 1.20 mg/dL 0.94  1.20  0.98   Sodium 135 - 145 mEq/L 138  140  140   Potassium 3.5 - 5.1 mEq/L 4.0  4.0  4.5   Chloride 96 - 112 mEq/L 104  105  104   CO2 19 - 32 mEq/L '25  26  27   '$ Calcium 8.4 - 10.5 mg/dL 9.6  9.8  10.1    Current antihypertensive regimen: (where are her current meds being filled?) Irbesartan 150 mg daily    Patient verbally confirms she is taking the above medications as directed. {yes/no:20286}  How often are you checking your Blood Pressure? {CHL HP BP Monitoring Frequency:[phone]}  she  checks her blood pressure {timing:25218} {before/after:25217} taking her medication.  Current home BP readings: ***  DATE:             BP               PULSE   Wrist or arm cuff:  OTC medications including pseudoephedrine or NSAIDs?  Any readings above 180/120? {yes/no:20286} If yes any symptoms of hypertensive emergency? {hypertensive emergency symptoms:25354}  What recent interventions/DTPs have been made by any provider to improve Blood Pressure control since last CPP Visit: No recent interventions noted.   Any recent hospitalizations or ED visits since last visit with CPP? No recent hospital visits noted.   What diet changes have been made to improve Blood Pressure Control?  Patient follow Breakfast -  Lunch -  Dinner -  Snack - Caffeine -  Salt intake -   What exercise is being done to improve your Blood Pressure Control?  ***  Adherence Review: Is the patient currently on ACE/ARB medication? Yes Does the patient have >5 day gap between last estimated fill dates? No  Care Gaps: AWV - scheduled 01/26/2023 Last BP - 101/80 on 03/23/2022 Tdap - never done Covid - overdue  Star Rating Drugs: Irbesartan '150mg'$  - last filled 03/22/2022 90 DS at [facility] verified with [place] Pharmacist Assistant [phone]

## 2022-06-17 NOTE — Telephone Encounter (Signed)
Pt is calling and would like a refill on HYDROcodone-acetaminophen (NORCO/VICODIN) 5-325 MG tablet  [place], [place] Phone: [phone]  Fax: [phone]

## 2022-06-18 NOTE — Addendum Note (Signed)
Addended by: Rodrigo Ran on: 06/18/2022 07:03 AM   Modules accepted: Orders

## 2022-06-19 MED ORDER — HYDROCODONE-ACETAMINOPHEN 5-325 MG PO TABS: 1.0000 | ORAL_TABLET | Freq: Three times a day (TID) | ORAL | 0 refills | Status: DC | PRN

## 2022-06-21 DIAGNOSIS — H40023 Open angle with borderline findings, high risk, bilateral: Secondary | ICD-10-CM | POA: Diagnosis not present

## 2022-06-21 DIAGNOSIS — H26492 Other secondary cataract, left eye: Secondary | ICD-10-CM | POA: Diagnosis not present

## 2022-07-05 ENCOUNTER — Ambulatory Visit (INDEPENDENT_AMBULATORY_CARE_PROVIDER_SITE_OTHER): Payer: Medicare Other

## 2022-07-05 DIAGNOSIS — E538 Deficiency of other specified B group vitamins: Secondary | ICD-10-CM | POA: Diagnosis not present

## 2022-07-05 MED ORDER — CYANOCOBALAMIN 1000 MCG/ML IJ SOLN
1000.0000 ug | Freq: Once | INTRAMUSCULAR | Status: AC
  Administered 2022-07-05: 1000 ug via INTRAMUSCULAR

## 2022-07-05 NOTE — Progress Notes (Signed)
Pt here for monthly B12 injection per Dr. Jordan.  B12 1000mcg given IM, and pt tolerated injection well.  

## 2022-07-23 ENCOUNTER — Other Ambulatory Visit: Payer: Self-pay | Admitting: Family Medicine

## 2022-07-23 NOTE — Telephone Encounter (Signed)
Prescription Request  07/23/2022  Is this a "Controlled Substance" medicine? Yes  LOV: 03/23/2022  What is the name of the medication or equipment? HYDROcodone-acetaminophen (NORCO/VICODIN) 5-325 MG tablet   Have you contacted your pharmacy to request a refill? No   Which pharmacy would you like this sent to?  [place], [place] [address] Phone: [phone] Fax: [phone]    Patient notified that their request is being sent to the clinical staff for review and that they should receive a response within 2 business days.   Please advise at Mobile [phone] (mobile)

## 2022-07-23 NOTE — Addendum Note (Signed)
Addended by: Rodrigo Ran on: 07/23/2022 03:49 PM   Modules accepted: Orders

## 2022-07-24 NOTE — Telephone Encounter (Signed)
Can you please contact pharmacy and ask under which name and DOB medication has been reported on PMP, I cannot find her. Thanks, BJ

## 2022-07-26 ENCOUNTER — Other Ambulatory Visit: Payer: Self-pay | Admitting: Family Medicine

## 2022-07-26 MED ORDER — HYDROCODONE-ACETAMINOPHEN 5-325 MG PO TABS: 1.0000 | ORAL_TABLET | Freq: Three times a day (TID) | ORAL | 0 refills | Status: DC | PRN

## 2022-07-26 NOTE — Telephone Encounter (Signed)
Last filled 06/20/22, they have her under [place] Cerise (her middle name) Zar, same birth date.

## 2022-08-05 ENCOUNTER — Ambulatory Visit (INDEPENDENT_AMBULATORY_CARE_PROVIDER_SITE_OTHER): Payer: Medicare Other

## 2022-08-05 DIAGNOSIS — E538 Deficiency of other specified B group vitamins: Secondary | ICD-10-CM

## 2022-08-05 MED ORDER — CYANOCOBALAMIN 1000 MCG/ML IJ SOLN
1000.0000 ug | Freq: Once | INTRAMUSCULAR | Status: AC
  Administered 2022-08-05: 1000 ug via INTRAMUSCULAR

## 2022-08-05 NOTE — Progress Notes (Signed)
Pt here for monthly B12 injection per Dr Martinique  B12 1019mg given IM Left Deltoid, and pt tolerated injection well.  Next B12 injection pt will call to schedule

## 2022-08-10 DIAGNOSIS — R350 Frequency of micturition: Secondary | ICD-10-CM | POA: Diagnosis not present

## 2022-08-12 DIAGNOSIS — M1991 Primary osteoarthritis, unspecified site: Secondary | ICD-10-CM | POA: Diagnosis not present

## 2022-08-12 DIAGNOSIS — M0589 Other rheumatoid arthritis with rheumatoid factor of multiple sites: Secondary | ICD-10-CM | POA: Diagnosis not present

## 2022-08-12 DIAGNOSIS — S86012D Strain of left Achilles tendon, subsequent encounter: Secondary | ICD-10-CM | POA: Diagnosis not present

## 2022-08-12 DIAGNOSIS — G894 Chronic pain syndrome: Secondary | ICD-10-CM | POA: Diagnosis not present

## 2022-08-12 DIAGNOSIS — M47812 Spondylosis without myelopathy or radiculopathy, cervical region: Secondary | ICD-10-CM | POA: Diagnosis not present

## 2022-08-12 DIAGNOSIS — M25512 Pain in left shoulder: Secondary | ICD-10-CM | POA: Diagnosis not present

## 2022-08-12 DIAGNOSIS — Z79899 Other long term (current) drug therapy: Secondary | ICD-10-CM | POA: Diagnosis not present

## 2022-08-12 DIAGNOSIS — M810 Age-related osteoporosis without current pathological fracture: Secondary | ICD-10-CM | POA: Diagnosis not present

## 2022-08-12 DIAGNOSIS — M722 Plantar fascial fibromatosis: Secondary | ICD-10-CM | POA: Diagnosis not present

## 2022-08-16 NOTE — Progress Notes (Unsigned)
ACUTE VISIT No chief complaint on file.  HPI: [name] is a 88 y.o. female, who is here today complaining of *** HPI  Review of Systems See other pertinent positives and negatives in HPI.  Current Outpatient Medications on File Prior to Visit  Medication Sig Dispense Refill   amoxicillin (AMOXIL) 500 MG tablet Take 4 tablet by mouth   BEFORE DENTAL PROCEDURE.     Cholecalciferol (VITAMIN D3) 5000 UNITS TABS Take 5,000 tablets by mouth daily.     cyanocobalamin (,VITAMIN B-12,) 1000 MCG/ML injection INJECT 1 ML EVERY 3 WEEKS. (Patient taking differently: Inject 1,000 mcg into the muscle. Every 4 weeks.) 4 mL 0   docusate sodium (COLACE) 100 MG capsule Take 1 capsule (100 mg total) by mouth 2 (two) times daily. (Patient taking differently: Take 100 mg by mouth daily.) 10 capsule 0   etanercept (ENBREL) 50 MG/ML injection Inject 50 mg into the skin once a week. Thursdays     folic acid (FOLVITE) 1 MG tablet Take 1 mg by mouth daily.     HYDROcodone-acetaminophen (NORCO/VICODIN) 5-325 MG tablet Take 1 tablet by mouth every 8 (eight) hours as needed for moderate pain. Max 70 tabs per month. 70 tablet 0   irbesartan (AVAPRO) 150 MG tablet TAKE ONE TABLET BY MOUTH DAILY. 90 tablet 3   levothyroxine (SYNTHROID) 50 MCG tablet TAKE ONE TABLET IN THE MORNING ON AN EMPTY STOMACH. 90 tablet 3   methotrexate (RHEUMATREX) 2.5 MG tablet Take 20 mg by mouth every Monday.      nitrofurantoin (MACRODANTIN) 100 MG capsule Take 100 mg by mouth daily. Urologist.     pantoprazole (PROTONIX) 20 MG tablet Take 1 tablet (20 mg total) by mouth daily. 30 tablet 3   zoledronic acid (RECLAST) 5 MG/100ML SOLN injection Inject 5 mg into the vein once.     No current facility-administered medications on file prior to visit.    Past Medical History:  Diagnosis Date   Anemia    Arthritis    Rhuematoid-takes Enbrel and Methorexate    Back pain    states in Jan 2017 spinal was attempted 4 times and has had  back pain since.   Cancer ([place])    skin cancer on head   Chicken pox    Diverticulosis    History of colon polyps    benign   Hypertension    takes Avapro daily   Hypothyroidism    takes Synthroid daily   Joint pain    Pneumonia    at age 33   PONV (postoperative nausea and vomiting)    the patch helps!   Urinary frequency    Urinary urgency    was taking Myrbetriq but stopped.Plans to start back   Allergies  Allergen Reactions   Oxycodone Other (See Comments)    Makes hallucinates   Sulfamethoxazole Nausea Only    Social History   Socioeconomic History   Marital status: Married    Spouse name: Not on file   Number of children: 2   Years of education: Not on file   Highest education level: Not on file  Occupational History   Occupation: Retired  Tobacco Use   Smoking status: Former    Types: Cigarettes    Quit date: 06/14/1978    Years since quitting: 44.2   Smokeless tobacco: Never   Tobacco comments:    quit 32 yrs ago  Vaping Use   Vaping Use: Never used  Substance and Sexual Activity  Alcohol use: Yes    Comment: occ   Drug use: No   Sexual activity: Never  Other Topics Concern   Not on file  Social History Narrative   She lives in [place] with her husband.   Social Determinants of Health   Financial Resource Strain: Low Risk  (01/22/2022)   Overall Financial Resource Strain (CARDIA)    Difficulty of Paying Living Expenses: Not hard at all  Food Insecurity: No Food Insecurity (01/22/2022)   Hunger Vital Sign    Worried About Running Out of Food in the Last Year: Never true    Ran Out of Food in the Last Year: Never true  Transportation Needs: No Transportation Needs (01/22/2022)   PRAPARE - Hydrologist (Medical): No    Lack of Transportation (Non-Medical): No  Physical Activity: Sufficiently Active (01/22/2022)   Exercise Vital Sign    Days of Exercise per Week: 5 days    Minutes of Exercise per Session: 30 min   Stress: No Stress Concern Present (01/22/2022)   [place]    Feeling of Stress : Not at all  Social Connections: Alex (01/22/2022)   Social Connection and Isolation Panel [NHANES]    Frequency of Communication with Friends and Family: More than three times a week    Frequency of Social Gatherings with Friends and Family: More than three times a week    Attends Religious Services: More than 4 times per year    Active Member of [employer] or Organizations: Yes    Attends Music therapist: More than 4 times per year    Marital Status: Married    There were no vitals filed for this visit. There is no height or weight on file to calculate BMI.  Physical Exam  ASSESSMENT AND PLAN: There are no diagnoses linked to this encounter.  No follow-ups on file.  Deundra Furber G. Martinique, MD  [facility] [place] office.  Discharge Instructions   None

## 2022-08-17 ENCOUNTER — Ambulatory Visit (INDEPENDENT_AMBULATORY_CARE_PROVIDER_SITE_OTHER): Payer: Medicare Other | Admitting: Family Medicine

## 2022-08-17 ENCOUNTER — Encounter: Payer: Self-pay | Admitting: Family Medicine

## 2022-08-17 VITALS — BP 128/80 | HR 70 | Temp 97.8°F | Resp 16 | Ht 64.0 in | Wt 139.2 lb

## 2022-08-17 DIAGNOSIS — J3489 Other specified disorders of nose and nasal sinuses: Secondary | ICD-10-CM | POA: Diagnosis not present

## 2022-08-17 DIAGNOSIS — J301 Allergic rhinitis due to pollen: Secondary | ICD-10-CM | POA: Diagnosis not present

## 2022-08-17 DIAGNOSIS — N1831 Chronic kidney disease, stage 3a: Secondary | ICD-10-CM | POA: Diagnosis not present

## 2022-08-17 MED ORDER — DOXYCYCLINE HYCLATE 100 MG PO TABS: 100.0000 mg | ORAL_TABLET | Freq: Two times a day (BID) | ORAL | 0 refills | Status: AC

## 2022-08-17 MED ORDER — FLUTICASONE PROPIONATE 50 MCG/ACT NA SUSP: 1.0000 | Freq: Two times a day (BID) | NASAL | 2 refills | Status: DC

## 2022-08-17 NOTE — Assessment & Plan Note (Signed)
Cr 0.9-1.0, last one 1.2 and e GFR in the low 50's (53), last one 55.  Continue low salt diet, hydration,and avoidance of NSAID's.

## 2022-08-17 NOTE — Patient Instructions (Addendum)
A few things to remember from today's visit:  Seasonal allergic rhinitis due to pollen  Sinus pain  Stage 3a chronic kidney disease ([place]), Chronic  Caution with Ibuprofen. I do not think you need antibiotics at this time but if right-sided sinus pain gets worse again, you can start Doxycycline. Nasal saline irrigations as needed several times per day. Flonase nasal spray 1 spray in each nostril at bedtime for 10-14 days then as needed.  If you need refills for medications you take chronically, please call your pharmacy. Do not use My Chart to request refills or for acute issues that need immediate attention. If you send a my chart message, it may take a few days to be addressed, specially if I am not in the office.  Please be sure medication list is accurate. If a new problem present, please set up appointment sooner than planned today.

## 2022-08-30 ENCOUNTER — Ambulatory Visit (INDEPENDENT_AMBULATORY_CARE_PROVIDER_SITE_OTHER): Payer: Medicare Other

## 2022-08-30 ENCOUNTER — Other Ambulatory Visit: Payer: Self-pay

## 2022-08-30 DIAGNOSIS — E538 Deficiency of other specified B group vitamins: Secondary | ICD-10-CM

## 2022-08-30 MED ORDER — CYANOCOBALAMIN 1000 MCG/ML IJ SOLN
1000.0000 ug | Freq: Once | INTRAMUSCULAR | Status: AC
  Administered 2022-08-30: 1000 ug via INTRAMUSCULAR

## 2022-08-30 NOTE — Progress Notes (Signed)
Pt here for monthly B12 injection per Dr. Jordan.   B12 1000mcg given IM, and pt tolerated injection well.    

## 2022-09-01 ENCOUNTER — Other Ambulatory Visit: Payer: Self-pay | Admitting: Family Medicine

## 2022-09-01 DIAGNOSIS — E039 Hypothyroidism, unspecified: Secondary | ICD-10-CM

## 2022-09-01 MED ORDER — HYDROCODONE-ACETAMINOPHEN 5-325 MG PO TABS: 1.0000 | ORAL_TABLET | Freq: Three times a day (TID) | ORAL | 0 refills | Status: DC | PRN

## 2022-09-01 NOTE — Telephone Encounter (Signed)
Pt is aware refill can take up to 3 business days 

## 2022-09-09 ENCOUNTER — Telehealth: Payer: Self-pay

## 2022-09-09 NOTE — Progress Notes (Signed)
Care Management & Coordination Services Pharmacy Team  Reason for Encounter: Hypertension  Contacted patient to discuss hypertension disease state. Spoke with patient on 09/13/2022   Current antihypertensive regimen:  Irbesartan 150 mg daily Patient verbally confirms she is taking the above medications as directed. Yes  How often are you checking your Blood Pressure? 1-2x per month, she states she has been to the doctor a lot and it's been checked at visits.   she checks her blood pressure various times  Current home BP readings: Patient is unsure of the exact readings however, she states they are in normal range around 120/80  Wrist or arm cuff: Arm cuff  Any readings above 180/100?  Patient denies  What recent interventions/DTPs have been made by any provider to improve Blood Pressure control since last CPP Visit: No recent interventions  Any recent hospitalizations or ED visits since last visit with CPP? No recent  What diet changes have been made to improve Blood Pressure Control?  Patient follow a weight watchers style diet Breakfast - patient will have fruit, english muffin and a coffee Lunch - patient will have leftovers or a sandwich Dinner - patient will have a meat or egg twice per week and vegetables Snack - patient will have a fruit if she wants a snack       Caffeine - 1 coffee daily       Salt intake - patient does add salt to her food  What exercise is being done to improve your Blood Pressure Control?  Nothing structured at this time   Adherence Review: Is the patient currently on ACE/ARB medication? Yes Does the patient have >5 day gap between last estimated fill dates? No  Care Gaps: AWV - completed 01/22/2022, scheduled 01/26/2023 Next appt -  Last BP - 128/80 on 08/17/2022 Tdap - never done Covid - overdue   Star Rating Drugs: Irbesartan 150mg  - last filled 06/21/2022 90 DS at [facility] verified with pharmacy.  Chart Updates:  Recent office  visits:  08/17/2022 Jennifer Martinique MD - Patient was seen for sinus pain and additional concerns. Started Doxycycline and Flonase.  Recent consult visits:  08/12/2022 Leigh Aurora (rheumatology) - Patient was seen for overweight and additional concerns. No additional chart notes.   Hospital visits:  None  Medications: Outpatient Encounter Medications as of 09/09/2022  Medication Sig   amoxicillin (AMOXIL) 500 MG tablet Take 4 tablet by mouth   BEFORE DENTAL PROCEDURE.   Cholecalciferol (VITAMIN D3) 5000 UNITS TABS Take 5,000 tablets by mouth daily.   cyanocobalamin (,VITAMIN B-12,) 1000 MCG/ML injection INJECT 1 ML EVERY 3 WEEKS. (Patient taking differently: Inject 1,000 mcg into the muscle. Every 4 weeks.)   docusate sodium (COLACE) 100 MG capsule Take 1 capsule (100 mg total) by mouth 2 (two) times daily. (Patient taking differently: Take 100 mg by mouth daily.)   etanercept (ENBREL) 50 MG/ML injection Inject 50 mg into the skin once a week. Thursdays   fluticasone (FLONASE) 50 MCG/ACT nasal spray Place 1 spray into both nostrils 2 (two) times daily.   folic acid (FOLVITE) 1 MG tablet Take 1 mg by mouth daily.   HYDROcodone-acetaminophen (NORCO/VICODIN) 5-325 MG tablet Take 1 tablet by mouth every 8 (eight) hours as needed for moderate pain. Max 70 tabs per month.   HYDROcodone-acetaminophen (NORCO/VICODIN) 5-325 MG tablet Take 1 tablet by mouth every 8 (eight) hours as needed for moderate pain. Max 70 tabs per month.   irbesartan (AVAPRO) 150 MG tablet TAKE ONE  TABLET BY MOUTH DAILY.   levothyroxine (SYNTHROID) 50 MCG tablet TAKE ONE TABLET EVERY MORNING ON AN EMPTY STOMACH   methotrexate (RHEUMATREX) 2.5 MG tablet Take 20 mg by mouth every Monday.    nitrofurantoin (MACRODANTIN) 100 MG capsule Take 100 mg by mouth daily. Urologist.   pantoprazole (PROTONIX) 20 MG tablet Take 1 tablet (20 mg total) by mouth daily.   zoledronic acid (RECLAST) 5 MG/100ML SOLN injection Inject 5 mg into the  vein once.   No facility-administered encounter medications on file as of 09/09/2022.  Fill History:    Dispensed Days Supply Quantity Provider Pharmacy  amoxicillin 500 mg tablet 11/06/2021        Dispensed Days Supply Quantity Provider Pharmacy  irbesartan 150 mg tablet 04/08/2021        Dispensed Days Supply Quantity Provider Pharmacy  levothyroxine 100 mcg capsule 04/08/2021       Recent vitals BP Readings from Last 3 Encounters:  08/17/22 128/80  03/23/22 101/80  12/29/21 (!) 151/78   Pulse Readings from Last 3 Encounters:  08/17/22 70  03/23/22 80  12/29/21 79   Wt Readings from Last 3 Encounters:  08/17/22 139 lb 4 oz (63.2 kg)  03/23/22 141 lb 4 oz (64.1 kg)  01/22/22 149 lb (67.6 kg)   BMI Readings from Last 3 Encounters:  08/17/22 23.90 kg/m  03/23/22 24.25 kg/m  01/22/22 25.58 kg/m    Recent lab results    Component Value Date/Time   NA 138 03/23/2022 1452   NA 140 05/25/2019 0000   K 4.0 03/23/2022 1452   CL 104 03/23/2022 1452   CO2 25 03/23/2022 1452   GLUCOSE 101 (H) 03/23/2022 1452   BUN 18 03/23/2022 1452   BUN 22 (A) 05/25/2019 0000   CREATININE 0.94 03/23/2022 1452   CALCIUM 9.6 03/23/2022 1452    Lab Results  Component Value Date   CREATININE 0.94 03/23/2022   GFR 55.53 (L) 03/23/2022   GFRNONAA 47 (L) 11/19/2017   GFRAA 54 (L) 11/19/2017   Lab Results  Component Value Date/Time   MICROALBUR <0.7 03/23/2022 02:52 PM   MICROALBUR 1.0 08/23/2018 10:03 AM    Lab Results  Component Value Date   CHOL 204 (H) 09/12/2020   HDL 106.40 09/12/2020   LDLCALC 85 09/12/2020   TRIG 64.0 09/12/2020   CHOLHDL 2 09/12/2020    [place] Pharmacist Assistant [phone]

## 2022-09-21 ENCOUNTER — Other Ambulatory Visit: Payer: Self-pay

## 2022-09-21 DIAGNOSIS — M25562 Pain in left knee: Secondary | ICD-10-CM | POA: Diagnosis not present

## 2022-09-21 DIAGNOSIS — I10 Essential (primary) hypertension: Secondary | ICD-10-CM

## 2022-09-21 DIAGNOSIS — N1831 Chronic kidney disease, stage 3a: Secondary | ICD-10-CM

## 2022-09-21 MED ORDER — IRBESARTAN 150 MG PO TABS: 150.0000 mg | ORAL_TABLET | Freq: Every day | ORAL | 1 refills | Status: DC

## 2022-09-30 ENCOUNTER — Ambulatory Visit (INDEPENDENT_AMBULATORY_CARE_PROVIDER_SITE_OTHER): Payer: Medicare Other | Admitting: *Deleted

## 2022-09-30 DIAGNOSIS — E538 Deficiency of other specified B group vitamins: Secondary | ICD-10-CM | POA: Diagnosis not present

## 2022-09-30 MED ORDER — CYANOCOBALAMIN 1000 MCG/ML IJ SOLN
1000.0000 ug | Freq: Once | INTRAMUSCULAR | Status: AC
  Administered 2022-09-30: 1000 ug via INTRAMUSCULAR

## 2022-09-30 NOTE — Progress Notes (Signed)
Per orders of Dr. Banks, injection of Cyanocobalamin 1000mcg given by Braedon Sjogren A. °Patient tolerated injection well. ° °

## 2022-10-12 ENCOUNTER — Telehealth: Payer: Self-pay

## 2022-10-12 NOTE — Progress Notes (Signed)
Care Management & Coordination Services Pharmacy Team  Reason for Encounter: Appointment Reminder  Contacted patient to confirm telephone appointment with Jennifer Goodman, PharmD on 10/13/2022 at 1:45. Unsuccessful outreach. Left voicemail for patient to return call.    Care Gaps: AWV - completed 01/22/2022, scheduled 01/26/2023 Last BP - 128/80 on 08/17/2022 Tdap - never done Covid - overdue   Star Rating Drugs: Irbesartan 150mg  - last filled 09/22/2022 90 DS at [facility] verified with pharmacy.  Inetta Fermo [facility]  Clinical Pharmacist Assistant [phone]

## 2022-10-12 NOTE — Progress Notes (Signed)
Care Management & Coordination Services Pharmacy Note  10/12/2022 Name:  Jennifer Goodman MRN:  [id number] DOB:  06/06/1935  Summary: BP at goal <140/90 Denies any falls in the last 6 months  Recommendations/Changes made from today's visit: -Counseled on recognizing signs of hypo/hypertension and proper BP monitoring -Counseled to check BP once weekly and keep a log -Counseled on calcium intake in the diet and Vit D daily for bone health, dexa scan has already been ordered, reminded patient  Follow up plan: General review in 3 months Pharmacist visit in 6 months   Subjective: Jennifer Goodman is an 88 y.o. year old female who is a primary patient of [place], Timoteo Expose, MD.  The care coordination team was consulted for assistance with disease management and care coordination needs.    Engaged with patient by telephone for follow up visit.  Recent office visits: 08/17/2022 Betty [place] MD - Patient was seen for sinus pain and additional concerns. Started Doxycycline and Flonase   Recent consult visits: 08/12/2022 Alben Deeds (rheumatology) - Patient was seen for overweight and additional concerns. No additional chart notes.   Hospital visits: None in previous 6 months   Objective:  Lab Results  Component Value Date   CREATININE 0.94 03/23/2022   BUN 18 03/23/2022   GFR 55.53 (L) 03/23/2022   GFRNONAA 47 (L) 11/19/2017   GFRAA 54 (L) 11/19/2017   NA 138 03/23/2022   K 4.0 03/23/2022   CALCIUM 9.6 03/23/2022   CO2 25 03/23/2022   GLUCOSE 101 (H) 03/23/2022    Lab Results  Component Value Date/Time   GFR 55.53 (L) 03/23/2022 02:52 PM   GFR 41.57 (L) 09/23/2021 11:06 AM   MICROALBUR <0.7 03/23/2022 02:52 PM   MICROALBUR 1.0 08/23/2018 10:03 AM    Last diabetic Eye exam: No results found for: "HMDIABEYEEXA"  Last diabetic Foot exam: No results found for: "HMDIABFOOTEX"   Lab Results  Component Value Date   CHOL 204 (H) 09/12/2020   HDL 106.40 09/12/2020   LDLCALC 85  09/12/2020   TRIG 64.0 09/12/2020   CHOLHDL 2 09/12/2020       Latest Ref Rng & Units 05/25/2019   12:00 AM 11/16/2017    5:40 AM 05/18/2016   11:40 AM  Hepatic Function  Total Protein 6.5 - 8.1 g/dL  6.0  6.4   Albumin 3.5 - 5.0 4.0     3.7  4.0   AST 13 - 35 33     29  32   ALT 7 - 35 31     18  19    Alk Phosphatase 25 - 125 63     62  60   Total Bilirubin 0.3 - 1.2 mg/dL  1.6  1.3      This result is from an external source.    Lab Results  Component Value Date/Time   TSH 3.38 09/23/2021 11:06 AM   TSH 2.77 09/12/2020 09:35 AM       Latest Ref Rng & Units 09/12/2020    9:35 AM 05/25/2019   12:00 AM 11/19/2017   10:09 AM  CBC  WBC 4.0 - 10.5 K/uL 6.8  7.0     8.1   Hemoglobin 12.0 - 15.0 g/dL 28.4  13.2     44.0   Hematocrit 36.0 - 46.0 % 35.8  34     35.1   Platelets 150.0 - 400.0 K/uL 247.0  310     212      This result  is from an external source.    Lab Results  Component Value Date/Time   VD25OH 65.27 09/23/2021 11:06 AM   VD25OH 89.52 02/24/2018 11:31 AM   VITAMINB12 635 09/23/2021 11:06 AM   VITAMINB12 434 01/23/2021 10:18 AM    Clinical ASCVD: No  The ASCVD Risk score (Arnett DK, et al., 2019) failed to calculate for the following reasons:   The 2019 ASCVD risk score is only valid for ages 24 to 17       08/17/2022    9:13 AM 03/23/2022    2:19 PM 01/22/2022    2:25 PM  Depression screen PHQ 2/9  Decreased Interest 0 0 0  Down, Depressed, Hopeless 0 0 0  PHQ - 2 Score 0 0 0  Altered sleeping  0   Tired, decreased energy  0   Change in appetite  0   Feeling bad or failure about yourself   0   Trouble concentrating  0   Moving slowly or fidgety/restless  0   Suicidal thoughts  0   PHQ-9 Score  0   Difficult doing work/chores  Not difficult at all      Social History   Tobacco Use  Smoking Status Former   Types: Cigarettes   Quit date: 06/14/1978   Years since quitting: 44.3  Smokeless Tobacco Never  Tobacco Comments   quit 32 yrs ago   BP  Readings from Last 3 Encounters:  08/17/22 128/80  03/23/22 101/80  12/29/21 (!) 151/78   Pulse Readings from Last 3 Encounters:  08/17/22 70  03/23/22 80  12/29/21 79   Wt Readings from Last 3 Encounters:  08/17/22 139 lb 4 oz (63.2 kg)  03/23/22 141 lb 4 oz (64.1 kg)  01/22/22 149 lb (67.6 kg)   BMI Readings from Last 3 Encounters:  08/17/22 23.90 kg/m  03/23/22 24.25 kg/m  01/22/22 25.58 kg/m    Allergies  Allergen Reactions   Oxycodone Other (See Comments)    Makes hallucinates   Sulfamethoxazole Nausea Only    Medications Reviewed Today     Reviewed by [place], Betty G, MD (Physician) on 08/17/22 at [phone]  Med List Status: <None>   Medication Order Taking? Sig Documenting Provider Last Dose Status Informant  amoxicillin (AMOXIL) 500 MG tablet [id number] No Take 4 tablet by mouth   BEFORE DENTAL PROCEDURE. [provider] Taking Active   Cholecalciferol (VITAMIN D3) 5000 UNITS TABS [id number] No Take 5,000 tablets by mouth daily. [provider] Taking Active Self  cyanocobalamin (,VITAMIN B-12,) 1000 MCG/ML injection [id number] No INJECT 1 ML EVERY 3 WEEKS.  Patient taking differently: Inject 1,000 mcg into the muscle. Every 4 weeks.   [place], Betty G, MD Taking Active   docusate sodium (COLACE) 100 MG capsule [id number] No Take 1 capsule (100 mg total) by mouth 2 (two) times daily.  Patient taking differently: Take 100 mg by mouth daily.   Regalado, Belkys A, MD Taking Active   etanercept (ENBREL) 50 MG/ML injection [id number] No Inject 50 mg into the skin once a week. Thursdays [provider] Taking Active Self           Med Note Larose Hires, ANNA E   Tue Nov 15, 2017  9:03 PM)    folic acid (FOLVITE) 1 MG tablet [id number] No Take 1 mg by mouth daily. [provider] Taking Active Self  HYDROcodone-acetaminophen (NORCO/VICODIN) 5-325 MG tablet [id number]  Take 1 tablet by mouth every 8 (eight) hours as needed for moderate  pain. Max 70  tabs per month. [place], Betty G, MD  Active   irbesartan (AVAPRO) 150 MG tablet [id number] No TAKE ONE TABLET BY MOUTH DAILY. [place], Betty G, MD Taking Active   levothyroxine (SYNTHROID) 50 MCG tablet [id number] No TAKE ONE TABLET IN THE MORNING ON AN EMPTY STOMACH. [place], Betty G, MD Taking Active   methotrexate [facility]) 2.5 MG tablet [id number] No Take 20 mg by mouth every Monday.  [provider] Taking Active Self           Med Note Larose Hires, ANNA E   Tue Nov 15, 2017  9:10 PM)    nitrofurantoin (MACRODANTIN) 100 MG capsule [id number] No Take 100 mg by mouth daily. Urologist. [provider] Taking Active   pantoprazole (PROTONIX) 20 MG tablet [id number] No Take 1 tablet (20 mg total) by mouth daily. [place], Betty G, MD Taking Active   zoledronic acid (RECLAST) 5 MG/100ML SOLN injection [id number] No Inject 5 mg into the vein once. [provider] Taking Active             SDOH:  (Social Determinants of Health) assessments and interventions performed: Yes SDOH Interventions    Flowsheet Row Clinical Support from 01/22/2022 in [facility] [place] Clinical Support from 01/20/2021 in [facility] [place] Chronic Care Management from 01/05/2021 in [facility] [place]  SDOH Interventions     Food Insecurity Interventions Intervention Not Indicated Intervention Not Indicated --  Housing Interventions Intervention Not Indicated Intervention Not Indicated --  Transportation Interventions Intervention Not Indicated Intervention Not Indicated Intervention Not Indicated  Financial Strain Interventions Intervention Not Indicated Intervention Not Indicated Intervention Not Indicated  Physical Activity Interventions Intervention Not Indicated Intervention Not Indicated --  Stress Interventions Intervention Not Indicated Intervention Not Indicated --  Social Connections Interventions Intervention Not  Indicated Intervention Not Indicated --       Medication Assistance: None required.  Patient affirms current coverage meets needs.  Medication Access: Within the past 30 days, how often has patient missed a dose of medication? None Is a pillbox or other method used to improve adherence? Yes  Factors that may affect medication adherence? no barriers identified Are meds synced by current pharmacy? No  Are meds delivered by current pharmacy? No  Does patient experience delays in picking up medications due to transportation concerns? No   [facility] Services Reviewed: Is patient disadvantaged to use [facility]?: No  Current Rx insurance plan: [facility] Name and location of Current pharmacy:  [facility] [place], [place] - [address] [place] [address] Phone: [phone] Fax: [phone]  [facility] Pharm[facility] services reviewed with patient today?: No  Patient requests to transfer care to [facility] Pharmacy?: No  Reason patient declined to change pharmacies: Not mentioned at this visit  Compliance/Adherence/Medication fill history: Care Gaps: AWV - completed 01/22/2022, scheduled 01/26/2023 Last BP - 128/80 on 08/17/2022 Tdap - never done Covid - overdue  Star-Rating Drugs: Irbesartan 150mg  - last filled 09/22/2022 90 DS at [facility] verified with pharmacy.   Assessment/Plan Hypertension (BP goal <140/90) -Controlled -Current treatment: Irbesartan 150mg  once daily Appropriate, Effective, Safe, Accessible -Medications previously tried: None  -Current home readings: Not checking routinely but reports WNL when she does -Current dietary habits: mindful of salt intake -Current exercise habits: Not discussed -Denies hypotensive/hypertensive symptoms -Educated on BP goals and benefits of medications for prevention of heart attack, stroke and kidney  damage; Importance of home blood pressure monitoring; Proper BP monitoring  technique; -Counseled to monitor BP at home weekly, document, and provide log at future appointments -Recommended to continue current medication   Osteoporosis / Osteopenia (Goal Prevent bone loss and bone fracture) -Controlled -Last DEXA Scan: 03/29/2018   T-Score femoral neck: LFN -1.9  10-year probability of hip fracture: 4.6% -Patient is a candidate for pharmacologic treatment due to T-Score -1.0 to -2.5 and 10-year risk of hip fracture > 3% -Current treatment  Reclast  injection via rheumatologist Appropriate, Effective, Safe, Accessible -Medications previously tried: fosamax (couldn't remember timing)  -Recommend [phone] units of vitamin D daily. Recommend 1200 mg of calcium daily from dietary and supplemental sources. -Recommended to continue current medication  Sherrill Raring Clinical Pharmacist [phone]

## 2022-10-13 ENCOUNTER — Ambulatory Visit: Payer: Medicare Other

## 2022-11-01 ENCOUNTER — Ambulatory Visit (INDEPENDENT_AMBULATORY_CARE_PROVIDER_SITE_OTHER): Payer: Medicare Other

## 2022-11-01 DIAGNOSIS — E538 Deficiency of other specified B group vitamins: Secondary | ICD-10-CM | POA: Diagnosis not present

## 2022-11-01 MED ORDER — CYANOCOBALAMIN 1000 MCG/ML IJ SOLN
1000.0000 ug | Freq: Once | INTRAMUSCULAR | Status: AC
  Administered 2022-11-01: 1000 ug via INTRAMUSCULAR

## 2022-11-01 NOTE — Progress Notes (Signed)
Per orders of Dr. Jordan, injection of Cyanocobalamin 1000 mcg given by Kytzia Gienger L Falynn Ailey. Patient tolerated injection well.  

## 2022-11-09 ENCOUNTER — Other Ambulatory Visit: Payer: Self-pay | Admitting: Family Medicine

## 2022-11-09 NOTE — Telephone Encounter (Signed)
Pt states she contacted the pharmacy and was told they are waiting for MD to call them regarding this refill.

## 2022-11-11 DIAGNOSIS — L570 Actinic keratosis: Secondary | ICD-10-CM | POA: Diagnosis not present

## 2022-11-11 DIAGNOSIS — M0589 Other rheumatoid arthritis with rheumatoid factor of multiple sites: Secondary | ICD-10-CM | POA: Diagnosis not present

## 2022-11-11 DIAGNOSIS — L821 Other seborrheic keratosis: Secondary | ICD-10-CM | POA: Diagnosis not present

## 2022-11-11 DIAGNOSIS — L814 Other melanin hyperpigmentation: Secondary | ICD-10-CM | POA: Diagnosis not present

## 2022-11-11 DIAGNOSIS — D1801 Hemangioma of skin and subcutaneous tissue: Secondary | ICD-10-CM | POA: Diagnosis not present

## 2022-11-11 DIAGNOSIS — D692 Other nonthrombocytopenic purpura: Secondary | ICD-10-CM | POA: Diagnosis not present

## 2022-11-11 DIAGNOSIS — D225 Melanocytic nevi of trunk: Secondary | ICD-10-CM | POA: Diagnosis not present

## 2022-11-11 DIAGNOSIS — Z85828 Personal history of other malignant neoplasm of skin: Secondary | ICD-10-CM | POA: Diagnosis not present

## 2022-11-26 NOTE — Progress Notes (Unsigned)
HPI: Jennifer Goodman is a 88 y.o. female with PMHx significant for paroxysmal atrial fibrillation, hypertension, CKD 3, RA,and generalized O here today for follow up on last visit, 08/17/22. Sh was seen on 08/17/22, when she was c/o severe frontal headache, sinus pressure, and nasal congestion She was empirically treated with doxycycline.  Today she is complaining of persistent right-sided sinus pain and persistent nasal congestion since March/2024. She reports having taken two courses of antibiotic, prescribed by her dentist,amoxicillin, without significant improvement in her symptoms.  She mentions that she had a tooth extracted in November/2023 and has been seeing her dentist for follow-up appointments. According to pt, initially, the dentist did not think there was a relationship between the tooth extraction and the sinus issue, but later reconsidered the possibility. The extraction site has been healing slowly, and she has been using a prescribed mouthwash to clean the area twice daily.  She describes her sinus symptoms as a constant feeling of being stopped up or having a rhinorreha with green discharge from the right nostril. She also reports some facial swelling on the right side and around her eye, although the pain is not as severe as when the issue first began. She denies any fever, chills, or changes in appetite but expresses concerns about experiencing fatigue and feeling drained.  She has a history of using Flonase for nasal congestion, which she found helpful, but is currently using Afrin due to running out of Flonase nasal spray.  Review of Systems  Constitutional:  Positive for fatigue. Negative for appetite change and chills.  HENT:  Positive for postnasal drip. Negative for ear pain, mouth sores, nosebleeds, sore throat and trouble swallowing.   Respiratory:  Negative for cough, shortness of breath and wheezing.   Gastrointestinal:  Negative for abdominal pain, nausea and vomiting.   Skin:  Negative for rash.  Allergic/Immunologic: Positive for environmental allergies.  Neurological:  Negative for syncope and facial asymmetry.  Psychiatric/Behavioral:  Negative for confusion and hallucinations.   See other pertinent positives and negatives in HPI.  Current Outpatient Medications on File Prior to Visit  Medication Sig Dispense Refill   amoxicillin (AMOXIL) 500 MG tablet Take 4 tablet by mouth   BEFORE DENTAL PROCEDURE.     Cholecalciferol (VITAMIN D3) 5000 UNITS TABS Take 5,000 tablets by mouth daily.     cyanocobalamin (,VITAMIN B-12,) 1000 MCG/ML injection INJECT 1 ML EVERY 3 WEEKS. (Patient taking differently: Inject 1,000 mcg into the muscle. Every 4 weeks.) 4 mL 0   docusate sodium (COLACE) 100 MG capsule Take 1 capsule (100 mg total) by mouth 2 (two) times daily. (Patient taking differently: Take 100 mg by mouth daily.) 10 capsule 0   etanercept (ENBREL) 50 MG/ML injection Inject 50 mg into the skin once a week. Thursdays     folic acid (FOLVITE) 1 MG tablet Take 1 mg by mouth daily.     HYDROcodone-acetaminophen (NORCO/VICODIN) 5-325 MG tablet Take 1 tablet by mouth every 8 (eight) hours as needed for moderate pain. Max 70 tabs per month. 70 tablet 0   HYDROcodone-acetaminophen (NORCO/VICODIN) 5-325 MG tablet TAKE 1 TABLET EVERY 8 HOURS AS NEEDED FOR MODERATE PAIN. MAX OF 70 TABLETS PER MONTH. 70 tablet 0   irbesartan (AVAPRO) 150 MG tablet Take 1 tablet (150 mg total) by mouth daily. 90 tablet 1   levothyroxine (SYNTHROID) 50 MCG tablet TAKE ONE TABLET EVERY MORNING ON AN EMPTY STOMACH 90 tablet 3   methotrexate (RHEUMATREX) 2.5 MG tablet Take 20 mg  by mouth every Monday.      nitrofurantoin (MACRODANTIN) 100 MG capsule Take 100 mg by mouth daily. Urologist.     pantoprazole (PROTONIX) 20 MG tablet Take 1 tablet (20 mg total) by mouth daily. 30 tablet 3   zoledronic acid (RECLAST) 5 MG/100ML SOLN injection Inject 5 mg into the vein once.     No current  facility-administered medications on file prior to visit.    Past Medical History:  Diagnosis Date   Anemia    Arthritis    Rhuematoid-takes Enbrel and Methorexate    Back pain    states in Jan 2017 spinal was attempted 4 times and has had back pain since.   Cancer (HCC)    skin cancer on head   Chicken pox    Diverticulosis    History of colon polyps    benign   Hypertension    takes Avapro daily   Hypothyroidism    takes Synthroid daily   Joint pain    Pneumonia    at age 19   PONV (postoperative nausea and vomiting)    the patch helps!   Urinary frequency    Urinary urgency    was taking Myrbetriq but stopped.Plans to start back   Allergies  Allergen Reactions   Oxycodone Other (See Comments)    Makes hallucinates   Sulfamethoxazole Nausea Only   Social History   Socioeconomic History   Marital status: Married    Spouse name: Not on file   Number of children: 2   Years of education: Not on file   Highest education level: Not on file  Occupational History   Occupation: Retired  Tobacco Use   Smoking status: Former    Types: Cigarettes    Quit date: 06/14/1978    Years since quitting: 44.4   Smokeless tobacco: Never   Tobacco comments:    quit 32 yrs ago  Vaping Use   Vaping Use: Never used  Substance and Sexual Activity   Alcohol use: Yes    Comment: occ   Drug use: No   Sexual activity: Never  Other Topics Concern   Not on file  Social History Narrative   She lives in [place] with her husband.   Social Determinants of Health   Financial Resource Strain: Low Risk  (01/22/2022)   Overall Financial Resource Strain (CARDIA)    Difficulty of Paying Living Expenses: Not hard at all  Food Insecurity: No Food Insecurity (01/22/2022)   Hunger Vital Sign    Worried About Running Out of Food in the Last Year: Never true    Ran Out of Food in the Last Year: Never true  Transportation Needs: No Transportation Needs (01/22/2022)   PRAPARE - Therapist, art (Medical): No    Lack of Transportation (Non-Medical): No  Physical Activity: Sufficiently Active (01/22/2022)   Exercise Vital Sign    Days of Exercise per Week: 5 days    Minutes of Exercise per Session: 30 min  Stress: No Stress Concern Present (01/22/2022)   [employer] of Occupational Health - Occupational Stress Questionnaire    Feeling of Stress : Not at all  Social Connections: Socially Integrated (01/22/2022)   Social Connection and Isolation Panel [NHANES]    Frequency of Communication with Friends and Family: More than three times a week    Frequency of Social Gatherings with Friends and Family: More than three times a week    Attends Religious Services: More than  4 times per year    Active Member of Clubs or Organizations: Yes    Attends Banker Meetings: More than 4 times per year    Marital Status: Married   Vitals:   11/29/22 1126  BP: 120/64  Pulse: 75  Resp: 16  Temp: (!) 97.5 F (36.4 C)  SpO2: 98%   Body mass index is 23.52 kg/m.  Physical Exam Constitutional:      General: She is not in acute distress.    Appearance: She is well-developed. She is not ill-appearing.  HENT:     Head: Normocephalic and atraumatic.     Nose: Congestion present.     Right Nostril: Occlusion present.     Left Nostril: Occlusion present.     Right Turbinates: Enlarged.     Left Turbinates: Enlarged.     Right Sinus: Maxillary sinus tenderness (mild. No facial edema appreciated) present. No frontal sinus tenderness.     Left Sinus: No maxillary sinus tenderness or frontal sinus tenderness.     Mouth/Throat:     Mouth: Mucous membranes are moist.     Pharynx: Oropharynx is clear.  Eyes:     Conjunctiva/sclera: Conjunctivae normal.  Cardiovascular:     Rate and Rhythm: Normal rate and regular rhythm.     Heart sounds: No murmur heard. Pulmonary:     Effort: Pulmonary effort is normal. No respiratory distress.     Breath sounds:  Normal breath sounds. No stridor.  Lymphadenopathy:     Head:     Right side of head: No submandibular adenopathy.     Left side of head: No submandibular adenopathy.     Cervical: No cervical adenopathy.  Skin:    General: Skin is warm.     Findings: No erythema or rash.  Neurological:     Mental Status: She is alert and oriented to person, place, and time.     Comments: Gait is not assisted.  Psychiatric:        Mood and Affect: Affect normal. Mood is anxious.   ASSESSMENT AND PLAN:  Jennifer Goodman was seen today for medical management of chronic issues.  Diagnoses and all orders for this visit:  Sinus pain Persistent right maxillary sinus pain. We discussed possible etiologies. Seasonal allergies could be a contributing factor. Further recommendation will be given according to maxillofacial CT.  -     CT MAXILLOFACIAL WO CONTRAST; Future  Maxillary sinusitis, unspecified chronicity Problem has improved. She has taking antibiotic treatment x 2. We discussed differential diagnosis. She agrees with holding on taking more antibiotics for now and further recommendation will be given according to facial CT. Monitor for new symptoms. Instructed about warning signs.  -     CT MAXILLOFACIAL WO CONTRAST; Future  Seasonal allergic rhinitis due to pollen Assessment & Plan: This problem could explain some of her symptoms. Resume Flonase nasal spray 1 spray in each nostril twice daily. Stop Afrin, we discussed some side effects. Nasal saline irrigations as needed throughout the day.  Orders: -     Fluticasone Propionate; Place 1 spray into both nostrils 2 (two) times daily.  Dispense: 16 g; Refill: 6  Return if symptoms worsen or fail to improve, for keep next appointment.  Alvenia Treese G. [place], MD  [facility]. [place] office.

## 2022-11-29 ENCOUNTER — Encounter: Payer: Self-pay | Admitting: Family Medicine

## 2022-11-29 ENCOUNTER — Ambulatory Visit (INDEPENDENT_AMBULATORY_CARE_PROVIDER_SITE_OTHER): Payer: Medicare Other | Admitting: Family Medicine

## 2022-11-29 VITALS — BP 120/64 | HR 75 | Temp 97.5°F | Resp 16 | Ht 64.0 in | Wt 137.0 lb

## 2022-11-29 DIAGNOSIS — J301 Allergic rhinitis due to pollen: Secondary | ICD-10-CM

## 2022-11-29 DIAGNOSIS — J320 Chronic maxillary sinusitis: Secondary | ICD-10-CM

## 2022-11-29 DIAGNOSIS — J3489 Other specified disorders of nose and nasal sinuses: Secondary | ICD-10-CM | POA: Diagnosis not present

## 2022-11-29 MED ORDER — FLUTICASONE PROPIONATE 50 MCG/ACT NA SUSP
1.0000 | Freq: Two times a day (BID) | NASAL | 6 refills | Status: AC
Start: 2022-11-29 — End: ?

## 2022-11-29 NOTE — Assessment & Plan Note (Signed)
This problem could explain some of her symptoms. Resume Flonase nasal spray 1 spray in each nostril twice daily. Stop Afrin, we discussed some side effects. Nasal saline irrigations as needed throughout the day.

## 2022-11-29 NOTE — Patient Instructions (Signed)
A few things to remember from today's visit:  Maxillary sinusitis, unspecified chronicity - Plan: CT MAXILLOFACIAL WO CONTRAST  Sinus pain - Plan: CT MAXILLOFACIAL WO CONTRAST  Seasonal allergic rhinitis due to pollen - Plan: fluticasone (FLONASE) 50 MCG/ACT nasal spray Avoid afrin. Flonase 1 spray 2 times daily. Nasal saline irrigations.  If you need refills for medications you take chronically, please call your pharmacy. Do not use My Chart to request refills or for acute issues that need immediate attention. If you send a my chart message, it may take a few days to be addressed, specially if I am not in the office.  Please be sure medication list is accurate. If a new problem present, please set up appointment sooner than planned today.

## 2022-12-03 ENCOUNTER — Ambulatory Visit (INDEPENDENT_AMBULATORY_CARE_PROVIDER_SITE_OTHER): Payer: Medicare Other

## 2022-12-03 DIAGNOSIS — E538 Deficiency of other specified B group vitamins: Secondary | ICD-10-CM

## 2022-12-03 MED ORDER — CYANOCOBALAMIN 1000 MCG/ML IJ SOLN
1000.0000 ug | Freq: Once | INTRAMUSCULAR | Status: AC
Start: 2022-12-03 — End: 2022-12-03
  Administered 2022-12-03: 1000 ug via INTRAMUSCULAR

## 2022-12-03 NOTE — Progress Notes (Signed)
Pt here for monthly B12 injection per Dr. Jordan.  B12 1000mcg given IM and pt tolerated injection well.   

## 2022-12-05 ENCOUNTER — Ambulatory Visit (HOSPITAL_BASED_OUTPATIENT_CLINIC_OR_DEPARTMENT_OTHER)
Admission: RE | Admit: 2022-12-05 | Discharge: 2022-12-05 | Disposition: A | Discharge status: Home / Self Care | Payer: Medicare Other | Source: Ambulatory Visit | Attending: Family Medicine | Admitting: Family Medicine

## 2022-12-05 DIAGNOSIS — J3489 Other specified disorders of nose and nasal sinuses: Secondary | ICD-10-CM | POA: Diagnosis not present

## 2022-12-05 DIAGNOSIS — Z471 Aftercare following joint replacement surgery: Secondary | ICD-10-CM | POA: Diagnosis not present

## 2022-12-05 DIAGNOSIS — J320 Chronic maxillary sinusitis: Secondary | ICD-10-CM | POA: Diagnosis not present

## 2022-12-05 DIAGNOSIS — J329 Chronic sinusitis, unspecified: Secondary | ICD-10-CM | POA: Diagnosis not present

## 2022-12-05 DIAGNOSIS — J342 Deviated nasal septum: Secondary | ICD-10-CM | POA: Diagnosis not present

## 2022-12-08 ENCOUNTER — Telehealth: Payer: Self-pay | Admitting: Family Medicine

## 2022-12-08 NOTE — Telephone Encounter (Signed)
Would like to speak with someone regarding CT scan results

## 2022-12-08 NOTE — Telephone Encounter (Signed)
Results aren't available yet, we will contact patient as soon as they are.

## 2022-12-10 ENCOUNTER — Other Ambulatory Visit: Payer: Self-pay | Admitting: Family Medicine

## 2022-12-13 ENCOUNTER — Other Ambulatory Visit: Payer: Self-pay

## 2022-12-13 DIAGNOSIS — J329 Chronic sinusitis, unspecified: Secondary | ICD-10-CM

## 2022-12-13 MED ORDER — AMOXICILLIN-POT CLAVULANATE 875-125 MG PO TABS: 1.0000 | ORAL_TABLET | Freq: Two times a day (BID) | ORAL | 0 refills | Status: DC

## 2022-12-22 DIAGNOSIS — H353131 Nonexudative age-related macular degeneration, bilateral, early dry stage: Secondary | ICD-10-CM | POA: Diagnosis not present

## 2022-12-22 DIAGNOSIS — H40023 Open angle with borderline findings, high risk, bilateral: Secondary | ICD-10-CM | POA: Diagnosis not present

## 2022-12-22 DIAGNOSIS — H26492 Other secondary cataract, left eye: Secondary | ICD-10-CM | POA: Diagnosis not present

## 2022-12-22 DIAGNOSIS — H31003 Unspecified chorioretinal scars, bilateral: Secondary | ICD-10-CM | POA: Diagnosis not present

## 2022-12-25 ENCOUNTER — Other Ambulatory Visit: Payer: Self-pay | Admitting: Family Medicine

## 2022-12-27 DIAGNOSIS — M810 Age-related osteoporosis without current pathological fracture: Secondary | ICD-10-CM | POA: Diagnosis not present

## 2023-01-04 ENCOUNTER — Ambulatory Visit (INDEPENDENT_AMBULATORY_CARE_PROVIDER_SITE_OTHER): Payer: Medicare Other | Admitting: *Deleted

## 2023-01-04 DIAGNOSIS — E538 Deficiency of other specified B group vitamins: Secondary | ICD-10-CM

## 2023-01-04 MED ORDER — CYANOCOBALAMIN 1000 MCG/ML IJ SOLN
1000.0000 ug | Freq: Once | INTRAMUSCULAR | Status: AC
Start: 2023-01-04 — End: 2023-01-04
  Administered 2023-01-04: 1000 ug via INTRAMUSCULAR

## 2023-01-04 NOTE — Progress Notes (Signed)
Per orders of Dr. Jordan, injection of B12 given by Rachel Vereen. Patient tolerated injection well. 

## 2023-01-19 ENCOUNTER — Other Ambulatory Visit: Payer: Self-pay | Admitting: Family Medicine

## 2023-01-19 NOTE — Telephone Encounter (Signed)
LOV 11/29/22 Last filled 12/13/22

## 2023-01-20 DIAGNOSIS — J320 Chronic maxillary sinusitis: Secondary | ICD-10-CM | POA: Diagnosis not present

## 2023-01-20 DIAGNOSIS — J301 Allergic rhinitis due to pollen: Secondary | ICD-10-CM | POA: Diagnosis not present

## 2023-01-27 ENCOUNTER — Encounter: Payer: Medicare Other | Admitting: Family Medicine

## 2023-01-27 NOTE — Progress Notes (Signed)
PATIENT CHECK-IN and HEALTH RISK ASSESSMENT QUESTIONNAIRE:  -completed by phone/video for upcoming Medicare Preventive Visit  Pre-Visit Check-in: 1)Vitals (height, wt, BP, etc) - record in vitals section for visit on day of visit Request home vitals (wt, BP, etc.) and enter into vitals, THEN update Vital Signs SmartPhrase below at the top of the HPI. See below.  2)Review and Update Medications, Allergies PMH, Surgeries, Social history in Epic 3)Hospitalizations in the last year with date/reason? ***  4)Review and Update Care Team (patient's specialists) in Epic 5) Complete PHQ9 in Epic  6) Complete Fall Screening in Epic 7)Review all Health Maintenance Due and order under PCP if not done.  8)Medicare Wellness Questionnaire: Answer theses question about your habits: Do you drink alcohol? *** If yes, how many drinks do you have a day?*** Have you ever smoked?*** Quit date if applicable? ***  How many packs a day do/did you smoke? *** Do you use smokeless tobacco?*** Do you use an illicit drugs?*** Do you exercises? ***IF so, what type and how many days/minutes per week?*** Are you sexually active? ***Number of partners?*** Typical breakfast**** Typical lunch*** Typical dinner*** Typical snacks:****  Beverages: ***  Answer theses question about you: Can you perform most household chores?*** Do you find it hard to follow a conversation in a noisy room?*** Do you often ask people to speak up or repeat themselves?*** Do you feel that you have a problem with memory?*** Do you balance your checkbook and or bank acounts?*** Do you feel safe at home?*** Last dentist visit?*** Do you need assistance with any of the following: Please note if so ***  Driving?  Feeding yourself?  Getting from bed to chair?  Getting to the toilet?  Bathing or showering?  Dressing yourself?  Managing money?  Climbing a flight of stairs  Preparing meals?  Do you have Advanced Directives in place  (Living Will, Healthcare Power or Attorney)? ***   Last eye Exam and location?***   Do you currently use prescribed or non-prescribed narcotic or opioid pain medications?***  Do you have a history or close family history of breast, ovarian, tubal or peritoneal cancer or a family member with BRCA (breast cancer susceptibility 1 and 2) gene mutations?  ***Request home vitals (wt, BP, etc.) and enter into vitals, THEN update Vital Signs SmartPhrase below at the top of the HPI. See below.   Nurse/Assistant Credentials/time stamp:   ----------------------------------------------------------------------------------------------------------------------------------------------------------------------------------------------------------------------  Vital Signs: {telehealth vitals:30100}   MEDICARE ANNUAL PREVENTIVE VISIT WITH PROVIDER: (Welcome to [employer], initial annual wellness or annual wellness exam)  Virtual Visit via Video***Phone Note  I connected with Ardis Rowan on 01/27/23 by phone *** a video enabled telemedicine application and verified that I am speaking with the correct person using two identifiers.  Location patient: home Location provider:work or home office Persons participating in the virtual visit: patient, provider  Concerns and/or follow up today:   See HM section in Epic for other details of completed HM.    ROS: negative for report of fevers, unintentional weight loss, vision changes, vision loss, hearing loss or change, chest pain, sob, hemoptysis, melena, hematochezia, hematuria, falls, bleeding or bruising, thoughts of suicide or self harm, memory loss  Patient-completed extensive health risk assessment - reviewed and discussed with the patient: See Health Risk Assessment completed with patient prior to the visit either above or in recent phone note. This was reviewed in detailed with the patient today and appropriate recommendations, orders and referrals  were placed as needed per Summary  below and patient instructions.   Review of Medical History: -PMH, PSH, Family History and current specialty and care providers reviewed and updated and listed below   Patient Care Team: [place], Betty G, MD as PCP - General (Family Medicine) Charna Elizabeth, MD as Attending Physician (Gastroenterology) Sherrill Raring, [facility] (Inactive) (Pharmacist)   Past Medical History:  Diagnosis Date   Anemia    Arthritis    Rhuematoid-takes Enbrel and Methorexate    Back pain    states in Jan 2017 spinal was attempted 4 times and has had back pain since.   Cancer (HCC)    skin cancer on head   Chicken pox    Diverticulosis    History of colon polyps    benign   Hypertension    takes Avapro daily   Hypothyroidism    takes Synthroid daily   Joint pain    Pneumonia    at age 3   PONV (postoperative nausea and vomiting)    the patch helps!   Urinary frequency    Urinary urgency    was taking Myrbetriq but stopped.Plans to start back    Past Surgical History:  Procedure Laterality Date   ACHILLES TENDON SURGERY Left 11/17/2017   Procedure: ACHILLES TENDON REPAIR;  Surgeon: Marcene Corning, MD;  Location: [facility] OR;  Service: Orthopedics;  Laterality: Left;   CHOLECYSTECTOMY  2000   COLONOSCOPY     EYE SURGERY Left    tear duct surgery   HAND TENDON SURGERY     Dr. Amanda Pea   KNEE ARTHROSCOPY     ORIF CALCANEOUS FRACTURE Left 11/17/2017   Procedure: OPEN TREATMENT OF CALCANEOUS FRACTURE;  Surgeon: Marcene Corning, MD;  Location: [facility] OR;  Service: Orthopedics;  Laterality: Left;  Patient is now an inpatient at [facility] will request that she be moved to [facility] and admitted there for this procedure to be done there.   ORIF ELBOW FRACTURE Right 02/25/2017   Procedure: OPEN REDUCTION INTERNAL FIXATION (ORIF) RIGHT PROXIMAL OLECRANON FRACTURE;  Surgeon: Bradly Bienenstock, MD;  Location: [facility] OR;  Service: Orthopedics;  Laterality: Right;   TOTAL KNEE ARTHROPLASTY Left 06/20/2015    TOTAL KNEE ARTHROPLASTY Left 06/20/2015   Procedure: TOTAL KNEE ARTHROPLASTY;  Surgeon: Jodi Geralds, MD;  Location: [facility] OR;  Service: Orthopedics;  Laterality: Left;   TOTAL KNEE ARTHROPLASTY Right 05/28/2016   Procedure: TOTAL KNEE ARTHROPLASTY;  Surgeon: Jodi Geralds, MD;  Location: [facility] OR;  Service: Orthopedics;  Laterality: Right;    Social History   Socioeconomic History   Marital status: Married    Spouse name: Not on file   Number of children: 2   Years of education: Not on file   Highest education level: Not on file  Occupational History   Occupation: Retired  Tobacco Use   Smoking status: Former    Current packs/day: 0.00    Types: Cigarettes    Quit date: 06/14/1978    Years since quitting: 44.6   Smokeless tobacco: Never   Tobacco comments:    quit 32 yrs ago  Vaping Use   Vaping status: Never Used  Substance and Sexual Activity   Alcohol use: Yes    Comment: occ   Drug use: No   Sexual activity: Never  Other Topics Concern   Not on file  Social History Narrative   She lives in [place] with her husband.   Social Determinants of Health   Financial Resource Strain: Low Risk  (01/22/2022)   Overall Physicist, medical Strain (  CARDIA)    Difficulty of Paying Living Expenses: Not hard at all  Food Insecurity: No Food Insecurity (01/22/2022)   Hunger Vital Sign    Worried About Running Out of Food in the Last Year: Never true    Ran Out of Food in the Last Year: Never true  Transportation Needs: No Transportation Needs (01/22/2022)   PRAPARE - Administrator, Civil Service (Medical): No    Lack of Transportation (Non-Medical): No  Physical Activity: Sufficiently Active (01/22/2022)   Exercise Vital Sign    Days of Exercise per Week: 5 days    Minutes of Exercise per Session: 30 min  Stress: No Stress Concern Present (01/22/2022)   [employer] of Occupational Health - Occupational Stress Questionnaire    Feeling of Stress : Not at all  Social  Connections: Socially Integrated (01/22/2022)   Social Connection and Isolation Panel [NHANES]    Frequency of Communication with Friends and Family: More than three times a week    Frequency of Social Gatherings with Friends and Family: More than three times a week    Attends Religious Services: More than 4 times per year    Active Member of [employer] or Organizations: Yes    Attends Engineer, structural: More than 4 times per year    Marital Status: Married  Catering manager Violence: Not At Risk (01/22/2022)   Humiliation, Afraid, Rape, and Kick questionnaire    Fear of Current or Ex-Partner: No    Emotionally Abused: No    Physically Abused: No    Sexually Abused: No    Family History  Problem Relation Age of Onset   Cancer Father        colon ca/ lung ca   Heart disease Father     Current Outpatient Medications on File Prior to Visit  Medication Sig Dispense Refill   amoxicillin (AMOXIL) 500 MG tablet Take 4 tablet by mouth   BEFORE DENTAL PROCEDURE.     amoxicillin-clavulanate (AUGMENTIN) 875-125 MG tablet Take 1 tablet by mouth 2 (two) times daily. 20 tablet 0   Cholecalciferol (VITAMIN D3) 5000 UNITS TABS Take 5,000 tablets by mouth daily.     cyanocobalamin (,VITAMIN B-12,) 1000 MCG/ML injection INJECT 1 ML EVERY 3 WEEKS. (Patient taking differently: Inject 1,000 mcg into the muscle. Every 4 weeks.) 4 mL 0   docusate sodium (COLACE) 100 MG capsule Take 1 capsule (100 mg total) by mouth 2 (two) times daily. (Patient taking differently: Take 100 mg by mouth daily.) 10 capsule 0   etanercept (ENBREL) 50 MG/ML injection Inject 50 mg into the skin once a week. Thursdays     fluticasone (FLONASE) 50 MCG/ACT nasal spray Place 1 spray into both nostrils 2 (two) times daily. 16 g 6   folic acid (FOLVITE) 1 MG tablet Take 1 mg by mouth daily.     HYDROcodone-acetaminophen (NORCO/VICODIN) 5-325 MG tablet Take 1 tablet by mouth every 8 (eight) hours as needed for moderate pain. Max  70 tabs per month. 70 tablet 0   HYDROcodone-acetaminophen (NORCO/VICODIN) 5-325 MG tablet TAKE 1 TABLET EVERY 8 HOURS AS NEEDED FOR MODERATE PAIN. MAX OF 70 TABLETS PER MONTH. 70 tablet 0   HYDROcodone-acetaminophen (NORCO/VICODIN) 5-325 MG tablet Take 1 tablet by mouth every 8 (eight) hours as needed for moderate pain. Max 70 tabs per month. 70 tablet 0   irbesartan (AVAPRO) 150 MG tablet Take 1 tablet (150 mg total) by mouth daily. 90 tablet 1   levothyroxine (  SYNTHROID) 50 MCG tablet TAKE ONE TABLET EVERY MORNING ON AN EMPTY STOMACH 90 tablet 3   methotrexate (RHEUMATREX) 2.5 MG tablet Take 20 mg by mouth every Monday.      nitrofurantoin (MACRODANTIN) 100 MG capsule Take 100 mg by mouth daily. Urologist.     pantoprazole (PROTONIX) 20 MG tablet Take 1 tablet (20 mg total) by mouth daily. 30 tablet 3   zoledronic acid (RECLAST) 5 MG/100ML SOLN injection Inject 5 mg into the vein once.     No current facility-administered medications on file prior to visit.    Allergies  Allergen Reactions   Oxycodone Other (See Comments)    Makes hallucinates   Sulfamethoxazole Nausea Only       Physical Exam Vitals requested from patient and listed below if patient had equipment and was able to obtain at home for this virtual visit: There were no vitals filed for this visit. Estimated body mass index is 23.52 kg/m as calculated from the following:   Height as of 11/29/22: 5\' 4"  (1.626 m).   Weight as of 11/29/22: 137 lb (62.1 kg).  EKG (optional): deferred due to virtual visit  GENERAL: alert, oriented, no acute distress detected, full vision exam deferred due to pandemic and/or virtual encounter  *** HEENT: atraumatic, conjunttiva clear, no obvious abnormalities on inspection of external nose and ears  NECK: normal movements of the head and neck  LUNGS: on inspection no signs of respiratory distress, breathing rate appears normal, no obvious gross SOB, gasping or wheezing  CV: no obvious  cyanosis  MS: moves all visible extremities without noticeable abnormality  PSYCH/NEURO: pleasant and cooperative, no obvious depression or anxiety, speech and thought processing grossly intact, Cognitive function grossly intact  Flowsheet Row Office Visit from 03/23/2022 in [facility] [place]  PHQ-9 Total Score 0           08/17/2022    9:13 AM 03/23/2022    2:19 PM 01/22/2022    2:25 PM 08/18/2021    4:07 PM 01/20/2021    9:10 AM  Depression screen PHQ 2/9  Decreased Interest 0 0 0 0 0  Down, Depressed, Hopeless 0 0 0 0 0  PHQ - 2 Score 0 0 0 0 0  Altered sleeping  0     Tired, decreased energy  0     Change in appetite  0     Feeling bad or failure about yourself   0     Trouble concentrating  0     Moving slowly or fidgety/restless  0     Suicidal thoughts  0     PHQ-9 Score  0     Difficult doing work/chores  Not difficult at all          08/18/2021    4:07 PM 12/29/2021    8:12 PM 01/22/2022    2:28 PM 03/23/2022    2:20 PM 08/17/2022    9:13 AM  Fall Risk  Falls in the past year? 1  1 1  0  Was there an injury with Fall? 1  1 1  0  Was there an injury with Fall? - Comments   Fx Rt leg. Followed by Orthopedic    Fall Risk Category Calculator 2  2 3  0  Fall Risk Category (Retired) Moderate  Moderate High   (RETIRED) Patient Fall Risk Level Low fall risk Low fall risk Moderate fall risk    Patient at Risk for Falls Due to  Other (Comment)  Fall risk Follow up   Falls prevention discussed  Falls evaluation completed     SUMMARY AND PLAN:  No diagnosis found.  Visit coding *** [phone] (annual wellness visit -initial); G0439 (annual wellness subsequent); G0402 Welcome to Medicare(initial preventive physical exam)   Discussed applicable health maintenance/preventive health measures and advised and referred or ordered per patient preferences:  Health Maintenance  Topic Date Due   DTaP/Tdap/Td (1 - Tdap) Never done   COVID-19 Vaccine (6 -  2023-24 season) 02/12/2022   Medicare Annual Wellness (AWV)  01/23/2023   INFLUENZA VACCINE  01/13/2023   DEXA SCAN  Completed   HPV VACCINES  Aged Out   Pneumonia Vaccine 62+ Years old  Discontinued   Zoster Vaccines- Shingrix  Discontinued     Vaccines   Mammogram   Screening Pap smear/pelvic exam   Lung Cancer Screening   Colorectal cancer screening   Osteoporosis screening if applicable  Screening for glaucoma  Statin use for primary prevention  Cardiovascular screening blood tests   Diabetes screening tests  Hepatitis B screening if applicable  HIV screening   Hepatitis C screening   Chlamydia/Gonorrhea screening  STI Screening   Syphilis Screening if applicable  Latent TB screening if applicable  Breast Cancer prevention medication if applicable  BRCA related risk assessment and referral for counseling if applicable  Education and counseling on the following was provided based on the above review of health and a plan/checklist for the patient, along with additional information discussed, was provided for the patient in the patient instructions :  -Advised on importance of completing advanced directives, discussed options for completing and provided information in patient instructions as well -Provided counseling and plan for difficulty hearing  -Provided counseling and plan for increased risk of falling if applicable per above screening. Reviewed and demonstrated safe balance exercises that can be done at home to improve balance and discussed exercise guidelines for adults with include balance exercises at least 3 days per week.  -Advised and counseled on a healthy lifestyle - including the importance of a healthy diet, regular physical activity, social connections and stress management. -Reviewed patient's current diet. Advised and counseled on a whole foods based healthy diet. A summary of a healthy diet was provided in the Patient Instructions.   -reviewed patient's current physical activity level and discussed exercise guidelines for adults. Discussed community resources and ideas for safe exercise at home to assist in meeting exercise guideline recommendations in a safe and healthy way.  -Advise yearly dental visits at minimum and regular eye exams -Advised and counseled on alcohol safe limits, risks/ tobacco use, risks of smoking and offered counseling/help, drug, opoid use/misuse   Follow up: see patient instructions     There are no Patient Instructions on file for this visit.  Terressa Koyanagi, DO

## 2023-02-04 ENCOUNTER — Ambulatory Visit (INDEPENDENT_AMBULATORY_CARE_PROVIDER_SITE_OTHER): Payer: Medicare Other

## 2023-02-04 DIAGNOSIS — E538 Deficiency of other specified B group vitamins: Secondary | ICD-10-CM

## 2023-02-04 MED ORDER — CYANOCOBALAMIN 1000 MCG/ML IJ SOLN
1000.0000 ug | Freq: Once | INTRAMUSCULAR | Status: AC
Start: 2023-02-04 — End: 2023-02-04
  Administered 2023-02-04: 1000 ug via INTRAMUSCULAR

## 2023-02-04 NOTE — Progress Notes (Signed)
Per orders of Dr. [place], injection of B12  given by Vickii Chafe. Patient tolerated injection well.

## 2023-02-10 DIAGNOSIS — M47812 Spondylosis without myelopathy or radiculopathy, cervical region: Secondary | ICD-10-CM | POA: Diagnosis not present

## 2023-02-10 DIAGNOSIS — G894 Chronic pain syndrome: Secondary | ICD-10-CM | POA: Diagnosis not present

## 2023-02-10 DIAGNOSIS — S86012D Strain of left Achilles tendon, subsequent encounter: Secondary | ICD-10-CM | POA: Diagnosis not present

## 2023-02-10 DIAGNOSIS — M0589 Other rheumatoid arthritis with rheumatoid factor of multiple sites: Secondary | ICD-10-CM | POA: Diagnosis not present

## 2023-02-10 DIAGNOSIS — M1991 Primary osteoarthritis, unspecified site: Secondary | ICD-10-CM | POA: Diagnosis not present

## 2023-02-10 DIAGNOSIS — M722 Plantar fascial fibromatosis: Secondary | ICD-10-CM | POA: Diagnosis not present

## 2023-02-10 DIAGNOSIS — M25512 Pain in left shoulder: Secondary | ICD-10-CM | POA: Diagnosis not present

## 2023-02-10 DIAGNOSIS — M810 Age-related osteoporosis without current pathological fracture: Secondary | ICD-10-CM | POA: Diagnosis not present

## 2023-02-10 DIAGNOSIS — J018 Other acute sinusitis: Secondary | ICD-10-CM | POA: Diagnosis not present

## 2023-02-10 DIAGNOSIS — Z79899 Other long term (current) drug therapy: Secondary | ICD-10-CM | POA: Diagnosis not present

## 2023-02-11 LAB — LAB REPORT - SCANNED: EGFR: 59

## 2023-02-18 DIAGNOSIS — J320 Chronic maxillary sinusitis: Secondary | ICD-10-CM | POA: Diagnosis not present

## 2023-02-18 DIAGNOSIS — J301 Allergic rhinitis due to pollen: Secondary | ICD-10-CM | POA: Diagnosis not present

## 2023-02-21 ENCOUNTER — Other Ambulatory Visit: Payer: Self-pay | Admitting: Family Medicine

## 2023-02-23 NOTE — Progress Notes (Unsigned)
HPI: [name] is a 88 y.o. female, who is here today for chronic disease management.  Last seen on 11/29/22 for acute visit.  Chronic pain: Generalized OA, RA, and severe back pain. Currently she is on hydrocodone-acetaminophen 5-325 mg, 70 tablets per month. Last follow-up in 03/2012. She is upset because she could not get her prescription at her pharmacy.  Most of the time she takes medication twice daily, occasionally she takes an extra tablet if pain is severe. While taking medication pain is tolerable, and she is able to function with no significant ADL limitations. She has tolerated medication well.  She keeps her medication in a safe place. She denies constipation, she has bowel movements almost daily.  RA on methotrexate and Enbrel. Follows with rheumatology regularly. Last visit with Dr Jennifer Goodman was 02/10/23.  Hypothyroidism: Currently she is on Synthroid 50 mcg daily. Lab Results  Component Value Date   TSH 3.38 09/23/2021    Nasal/Sinus: She has been dealing with right-sided facial pain, nasal congestion, and occasional conjunctival erythema and retro-ocular pain since 08/2022. She has taken several courses of antibiotics with no relief.  Maxillofacial CT on 12/12/2022. 1. Complete opacification of the right frontal sinus, anterior right ethmoid air cells, and right maxillary sinus, with osseous thickening of the right frontal and maxillary sinuses, compatible with chronic sinusitis. 2. Dehiscence of the inferior wall of the right maxillary sinus at the prior location of the right maxillary first molar, which could suggest a partial odontogenic cause for the sinusitis. 3. Partial opacification of the left maxillary sinus, with some bubbly secretions and osseous thickening, compatible with chronic sinusitis.  She saw ENT on 02/18/2023, possible surgical treatment has been discussed.  Hypertension: Currently she is on irbesartan 150 mg daily. Atrial fib, she is  not on anticoagulation.  Negative for unusual or severe headache, visual changes, exertional chest pain, dyspnea,  focal weakness, or worsening edema. She had CMP recently at her rheumatologist office, 02/10/2023: Creatinine 0.95, EGFR 59, K+ 4.4.  Review of Systems  Constitutional:  Positive for fatigue. Negative for chills and fever.  HENT:  Positive for congestion, postnasal drip, sinus pressure and sinus pain. Negative for sore throat.   Respiratory:  Negative for cough and wheezing.   Gastrointestinal:  Negative for abdominal pain, nausea and vomiting.  Endocrine: Negative for cold intolerance and heat intolerance.  Genitourinary:  Negative for decreased urine volume, dysuria and hematuria.  Musculoskeletal:  Positive for arthralgias, back pain and gait problem.  Skin:  Negative for rash.  Allergic/Immunologic: Positive for environmental allergies.  Neurological:  Negative for syncope and facial asymmetry.  Psychiatric/Behavioral:  Negative for confusion and hallucinations.   BP adequately controlled.  See other pertinent positives and negatives in HPI.  Current Outpatient Medications on File Prior to Visit  Medication Sig Dispense Refill   amoxicillin (AMOXIL) 500 MG tablet Take 4 tablet by mouth   BEFORE DENTAL PROCEDURE.     Cholecalciferol (VITAMIN D3) 5000 UNITS TABS Take 5,000 tablets by mouth daily.     cyanocobalamin (,VITAMIN B-12,) 1000 MCG/ML injection INJECT 1 ML EVERY 3 WEEKS. (Patient taking differently: Inject 1,000 mcg into the muscle. Every 4 weeks.) 4 mL 0   docusate sodium (COLACE) 100 MG capsule Take 1 capsule (100 mg total) by mouth 2 (two) times daily. (Patient taking differently: Take 100 mg by mouth daily.) 10 capsule 0   etanercept (ENBREL) 50 MG/ML injection Inject 50 mg into the skin once a week. Thursdays  fluticasone (FLONASE) 50 MCG/ACT nasal spray Place 1 spray into both nostrils 2 (two) times daily. 16 g 6   folic acid (FOLVITE) 1 MG tablet Take 1 mg  by mouth daily.     irbesartan (AVAPRO) 150 MG tablet Take 1 tablet (150 mg total) by mouth daily. 90 tablet 1   levothyroxine (SYNTHROID) 50 MCG tablet TAKE ONE TABLET EVERY MORNING ON AN EMPTY STOMACH 90 tablet 3   methotrexate (RHEUMATREX) 2.5 MG tablet Take 20 mg by mouth every Monday.      nitrofurantoin (MACRODANTIN) 100 MG capsule Take 100 mg by mouth daily. Urologist.     pantoprazole (PROTONIX) 20 MG tablet Take 1 tablet (20 mg total) by mouth daily. 30 tablet 3   zoledronic acid (RECLAST) 5 MG/100ML SOLN injection Inject 5 mg into the vein once.     No current facility-administered medications on file prior to visit.    Past Medical History:  Diagnosis Date   Anemia    Arthritis    Rhuematoid-takes Enbrel and Methorexate    Back pain    states in Jan 2017 spinal was attempted 4 times and has had back pain since.   Cancer (HCC)    skin cancer on head   Chicken pox    Diverticulosis    History of colon polyps    benign   Hypertension    takes Avapro daily   Hypothyroidism    takes Synthroid daily   Joint pain    Pneumonia    at age 79   PONV (postoperative nausea and vomiting)    the patch helps!   Urinary frequency    Urinary urgency    was taking Myrbetriq but stopped.Plans to start back   Allergies  Allergen Reactions   Oxycodone Other (See Comments)    Makes hallucinates   Sulfamethoxazole Nausea Only    Social History   Socioeconomic History   Marital status: Married    Spouse name: Not on file   Number of children: 2   Years of education: Not on file   Highest education level: Not on file  Occupational History   Occupation: Retired  Tobacco Use   Smoking status: Former    Current packs/day: 0.00    Types: Cigarettes    Quit date: 06/14/1978    Years since quitting: 44.7   Smokeless tobacco: Never   Tobacco comments:    quit 32 yrs ago  Vaping Use   Vaping status: Never Used  Substance and Sexual Activity   Alcohol use: Yes    Comment: occ    Drug use: No   Sexual activity: Never  Other Topics Concern   Not on file  Social History Narrative   She lives in [place] with her husband.   Social Determinants of Health   Financial Resource Strain: Low Risk  (01/22/2022)   Overall Financial Resource Strain (CARDIA)    Difficulty of Paying Living Expenses: Not hard at all  Food Insecurity: No Food Insecurity (01/22/2022)   Hunger Vital Sign    Worried About Running Out of Food in the Last Year: Never true    Ran Out of Food in the Last Year: Never true  Transportation Needs: No Transportation Needs (01/22/2022)   PRAPARE - Administrator, Civil Service (Medical): No    Lack of Transportation (Non-Medical): No  Physical Activity: Sufficiently Active (01/22/2022)   Exercise Vital Sign    Days of Exercise per Week: 5 days  Minutes of Exercise per Session: 30 min  Stress: No Stress Concern Present (01/22/2022)   [employer] of Occupational Health - Occupational Stress Questionnaire    Feeling of Stress : Not at all  Social Connections: Socially Integrated (01/22/2022)   Social Connection and Isolation Panel [NHANES]    Frequency of Communication with Friends and Family: More than three times a week    Frequency of Social Gatherings with Friends and Family: More than three times a week    Attends Religious Services: More than 4 times per year    Active Member of [employer] or Organizations: Yes    Attends Banker Meetings: More than 4 times per year    Marital Status: Married   Vitals:   02/25/23 1530  BP: 130/70  Pulse: 100  Resp: 16  SpO2: 97%   Body mass index is 23.71 kg/m.  Physical Exam Vitals and nursing note reviewed.  Constitutional:      General: She is not in acute distress.    Appearance: She is well-developed.  HENT:     Head: Normocephalic and atraumatic.  Eyes:     Conjunctiva/sclera: Conjunctivae normal.  Cardiovascular:     Rate and Rhythm: Normal rate and regular  rhythm.     Heart sounds: No murmur heard.    Comments: Trace pitting LE edema, bilateral. DP pulses palpable. Pulmonary:     Effort: Pulmonary effort is normal. No respiratory distress.     Breath sounds: Normal breath sounds.  Abdominal:     Palpations: Abdomen is soft. There is no mass.     Tenderness: There is no abdominal tenderness.  Lymphadenopathy:     Cervical: No cervical adenopathy.  Skin:    General: Skin is warm.     Findings: No erythema or rash.  Neurological:     General: No focal deficit present.     Mental Status: She is alert and oriented to person, place, and time.     Cranial Nerves: No cranial nerve deficit.     Comments: Antalgic gait assisted with a cane.  Psychiatric:        Mood and Affect: Mood and affect normal.   ASSESSMENT AND PLAN:  Ms. Jennifer Goodman was seen today for medical management of chronic issues.  Diagnoses and all orders for this visit: Chronic pain disorder Assessment & Plan: We discussed current guidelines for chronic pain and chronic opioid use. Because of chronic comorbidities, she cannot take NSAIDs. Med contract signed today. PDMP reviewed. Prescription for hydrocodone-acetaminophen 5-325 mg # 70 sent to her pharmacy. We discussed side effects.  Orders: -     HYDROcodone-Acetaminophen; Take 1 tablet by mouth every 8 (eight) hours as needed for moderate pain. Max 70 tabs per month.  Dispense: 70 tablet; Refill: 0  Stage 3a chronic kidney disease (HCC) Assessment & Plan: Cr 0.9-1.0, last one 0.9 and e GFR in the low 50's, last one 59 on 02/10/23.Marland Kitchen  Continue low salt diet, hydration,and avoidance of NSAID's. Continue irbesartan.   Hypertension, essential, benign Assessment & Plan: BP adequately controlled. For now continue Avapro 75 mg daily and low salt diet. Monitor BP at home regularly.   Hypothyroidism (acquired) Assessment & Plan: Problem has been well-controlled. Continue Synthroid 50 mcg daily. Last TSH was 3.3 in  09/2021. Will plan on labs next visit.   Paroxysmal atrial fibrillation [facility]) Assessment & Plan: Today in SR. She is not on chronic anticoagulation. She is no longer following with cardiologist.   Chronic maxillary  sinusitis and allergic rhinitis: She following with ENT, last visit on 02/18/2023.  Surgical management for chronic sinusitis has been discussed, pending appointment with surgeon, 03/02/23.  She is going to need cardiac clearance if she decides to go ahead with surgery.  I spent a total of 41 minutes in both face to face and non face to face activities for this visit on the date of this encounter. During this time history was obtained and documented, examination was performed, prior labs/imaging reviewed, and assessment/plan discussed.  Return in about 4 months (around 06/27/2023) for chronic problems.  Osei Anger G. [place], MD  [facility]. [place] office.

## 2023-02-25 ENCOUNTER — Encounter: Payer: Self-pay | Admitting: Family Medicine

## 2023-02-25 ENCOUNTER — Ambulatory Visit (INDEPENDENT_AMBULATORY_CARE_PROVIDER_SITE_OTHER): Payer: Medicare Other | Admitting: Family Medicine

## 2023-02-25 VITALS — BP 130/70 | HR 100 | Resp 16 | Ht 64.0 in | Wt 138.1 lb

## 2023-02-25 DIAGNOSIS — J320 Chronic maxillary sinusitis: Secondary | ICD-10-CM

## 2023-02-25 DIAGNOSIS — E039 Hypothyroidism, unspecified: Secondary | ICD-10-CM

## 2023-02-25 DIAGNOSIS — G894 Chronic pain syndrome: Secondary | ICD-10-CM | POA: Diagnosis not present

## 2023-02-25 DIAGNOSIS — I480 Paroxysmal atrial fibrillation: Secondary | ICD-10-CM

## 2023-02-25 DIAGNOSIS — N1831 Chronic kidney disease, stage 3a: Secondary | ICD-10-CM | POA: Diagnosis not present

## 2023-02-25 DIAGNOSIS — I10 Essential (primary) hypertension: Secondary | ICD-10-CM

## 2023-02-25 MED ORDER — HYDROCODONE-ACETAMINOPHEN 5-325 MG PO TABS
1.0000 | ORAL_TABLET | Freq: Three times a day (TID) | ORAL | 0 refills | Status: DC | PRN
Start: 2023-02-25 — End: 2023-04-06

## 2023-02-25 NOTE — Assessment & Plan Note (Signed)
BP adequately controlled. For now continue Avapro 75 mg daily and low salt diet. Monitor BP at home regularly.

## 2023-02-25 NOTE — Assessment & Plan Note (Signed)
We discussed current guidelines for chronic pain and chronic opioid use. Because of chronic comorbidities, she cannot take NSAIDs. Med contract signed today. PDMP reviewed. Prescription for hydrocodone-acetaminophen 5-325 mg # 70 sent to her pharmacy. We discussed side effects.

## 2023-02-25 NOTE — Patient Instructions (Addendum)
A few things to remember from today's visit:  Chronic pain disorder - Plan: HYDROcodone-acetaminophen (NORCO/VICODIN) 5-325 MG tablet  Stage 3a chronic kidney disease (HCC)  Hypertension, essential, benign  Hypothyroidism (acquired)  Will plan on labs next visit.  If you need refills for medications you take chronically, please call your pharmacy. Do not use My Chart to request refills or for acute issues that need immediate attention. If you send a my chart message, it may take a few days to be addressed, specially if I am not in the office.  Please be sure medication list is accurate. If a new problem present, please set up appointment sooner than planned today.

## 2023-02-25 NOTE — Assessment & Plan Note (Addendum)
Today in SR. She is not on chronic anticoagulation. She is no longer following with cardiologist.

## 2023-02-25 NOTE — Assessment & Plan Note (Signed)
Problem has been well-controlled. Continue Synthroid 50 mcg daily. Last TSH was 3.3 in 09/2021. Will plan on labs next visit.

## 2023-02-25 NOTE — Assessment & Plan Note (Addendum)
Cr 0.9-1.0, last one 0.9 and e GFR in the low 50's, last one 59 on 02/10/23.Marland Kitchen  Continue low salt diet, hydration,and avoidance of NSAID's. Continue irbesartan.

## 2023-03-02 DIAGNOSIS — J324 Chronic pansinusitis: Secondary | ICD-10-CM | POA: Diagnosis not present

## 2023-03-02 DIAGNOSIS — J342 Deviated nasal septum: Secondary | ICD-10-CM | POA: Diagnosis not present

## 2023-03-04 ENCOUNTER — Ambulatory Visit (INDEPENDENT_AMBULATORY_CARE_PROVIDER_SITE_OTHER): Payer: Medicare Other

## 2023-03-04 DIAGNOSIS — E538 Deficiency of other specified B group vitamins: Secondary | ICD-10-CM

## 2023-03-04 MED ORDER — CYANOCOBALAMIN 1000 MCG/ML IJ SOLN
1000.0000 ug | Freq: Once | INTRAMUSCULAR | Status: AC
Start: 2023-03-04 — End: 2023-03-04
  Administered 2023-03-04: 1000 ug via INTRAMUSCULAR

## 2023-03-04 NOTE — Progress Notes (Signed)
Per orders of Dr. Swaziland , injection of B-12 given by Stann Ore. Patient tolerated injection well.

## 2023-03-17 ENCOUNTER — Other Ambulatory Visit: Payer: Self-pay | Admitting: Family Medicine

## 2023-03-17 DIAGNOSIS — N1831 Chronic kidney disease, stage 3a: Secondary | ICD-10-CM

## 2023-03-17 DIAGNOSIS — I10 Essential (primary) hypertension: Secondary | ICD-10-CM

## 2023-03-31 DIAGNOSIS — M5412 Radiculopathy, cervical region: Secondary | ICD-10-CM | POA: Diagnosis not present

## 2023-04-04 ENCOUNTER — Ambulatory Visit (INDEPENDENT_AMBULATORY_CARE_PROVIDER_SITE_OTHER): Payer: Medicare Other

## 2023-04-04 ENCOUNTER — Other Ambulatory Visit: Payer: Self-pay | Admitting: Family Medicine

## 2023-04-04 DIAGNOSIS — G894 Chronic pain syndrome: Secondary | ICD-10-CM

## 2023-04-04 DIAGNOSIS — E538 Deficiency of other specified B group vitamins: Secondary | ICD-10-CM | POA: Diagnosis not present

## 2023-04-04 MED ORDER — CYANOCOBALAMIN 1000 MCG/ML IJ SOLN
1000.0000 ug | Freq: Once | INTRAMUSCULAR | Status: AC
Start: 2023-04-04 — End: 2023-04-04
  Administered 2023-04-04: 1000 ug via INTRAMUSCULAR

## 2023-04-04 NOTE — Progress Notes (Signed)
Per orders of Dr. [place], injection of B12 given by Vickii Chafe on Left Deltoid. Patient tolerated injection well.

## 2023-04-05 DIAGNOSIS — M19012 Primary osteoarthritis, left shoulder: Secondary | ICD-10-CM | POA: Diagnosis not present

## 2023-04-05 DIAGNOSIS — M19011 Primary osteoarthritis, right shoulder: Secondary | ICD-10-CM | POA: Diagnosis not present

## 2023-04-05 DIAGNOSIS — M67911 Unspecified disorder of synovium and tendon, right shoulder: Secondary | ICD-10-CM | POA: Diagnosis not present

## 2023-04-05 DIAGNOSIS — M67912 Unspecified disorder of synovium and tendon, left shoulder: Secondary | ICD-10-CM | POA: Diagnosis not present

## 2023-04-06 DIAGNOSIS — M5412 Radiculopathy, cervical region: Secondary | ICD-10-CM | POA: Diagnosis not present

## 2023-04-28 DIAGNOSIS — M5412 Radiculopathy, cervical region: Secondary | ICD-10-CM | POA: Diagnosis not present

## 2023-05-05 ENCOUNTER — Ambulatory Visit (INDEPENDENT_AMBULATORY_CARE_PROVIDER_SITE_OTHER): Payer: Medicare Other

## 2023-05-05 DIAGNOSIS — Z23 Encounter for immunization: Secondary | ICD-10-CM | POA: Diagnosis not present

## 2023-05-05 DIAGNOSIS — E538 Deficiency of other specified B group vitamins: Secondary | ICD-10-CM | POA: Diagnosis not present

## 2023-05-05 DIAGNOSIS — M0589 Other rheumatoid arthritis with rheumatoid factor of multiple sites: Secondary | ICD-10-CM | POA: Diagnosis not present

## 2023-05-05 MED ORDER — CYANOCOBALAMIN 1000 MCG/ML IJ SOLN
1000.0000 ug | Freq: Once | INTRAMUSCULAR | Status: AC
Start: 2023-05-05 — End: 2023-05-05
  Administered 2023-05-05: 1000 ug via INTRAMUSCULAR

## 2023-05-05 NOTE — Progress Notes (Signed)
Per orders of Dr. Salomon Fick, injection of B12 given by Vickii Chafe on Right Deltoid. Patient tolerated injection well.

## 2023-05-09 ENCOUNTER — Other Ambulatory Visit: Payer: Self-pay | Admitting: Family Medicine

## 2023-05-09 DIAGNOSIS — L570 Actinic keratosis: Secondary | ICD-10-CM | POA: Diagnosis not present

## 2023-05-09 DIAGNOSIS — D0471 Carcinoma in situ of skin of right lower limb, including hip: Secondary | ICD-10-CM | POA: Diagnosis not present

## 2023-05-09 DIAGNOSIS — D485 Neoplasm of uncertain behavior of skin: Secondary | ICD-10-CM | POA: Diagnosis not present

## 2023-05-09 DIAGNOSIS — Z85828 Personal history of other malignant neoplasm of skin: Secondary | ICD-10-CM | POA: Diagnosis not present

## 2023-05-09 DIAGNOSIS — G894 Chronic pain syndrome: Secondary | ICD-10-CM

## 2023-05-09 DIAGNOSIS — D1801 Hemangioma of skin and subcutaneous tissue: Secondary | ICD-10-CM | POA: Diagnosis not present

## 2023-05-09 DIAGNOSIS — L814 Other melanin hyperpigmentation: Secondary | ICD-10-CM | POA: Diagnosis not present

## 2023-05-09 DIAGNOSIS — C44729 Squamous cell carcinoma of skin of left lower limb, including hip: Secondary | ICD-10-CM | POA: Diagnosis not present

## 2023-05-09 DIAGNOSIS — L821 Other seborrheic keratosis: Secondary | ICD-10-CM | POA: Diagnosis not present

## 2023-05-10 NOTE — Telephone Encounter (Signed)
Last filled 10/23

## 2023-05-19 DIAGNOSIS — M1712 Unilateral primary osteoarthritis, left knee: Secondary | ICD-10-CM | POA: Diagnosis not present

## 2023-05-19 DIAGNOSIS — J320 Chronic maxillary sinusitis: Secondary | ICD-10-CM | POA: Diagnosis not present

## 2023-05-26 DIAGNOSIS — M67911 Unspecified disorder of synovium and tendon, right shoulder: Secondary | ICD-10-CM | POA: Diagnosis not present

## 2023-05-26 DIAGNOSIS — M25511 Pain in right shoulder: Secondary | ICD-10-CM | POA: Diagnosis not present

## 2023-06-12 DIAGNOSIS — E538 Deficiency of other specified B group vitamins: Secondary | ICD-10-CM | POA: Insufficient documentation

## 2023-06-13 ENCOUNTER — Ambulatory Visit: Payer: Medicare Other | Admitting: Family Medicine

## 2023-06-13 ENCOUNTER — Ambulatory Visit (INDEPENDENT_AMBULATORY_CARE_PROVIDER_SITE_OTHER): Payer: Medicare Other | Admitting: *Deleted

## 2023-06-13 DIAGNOSIS — E538 Deficiency of other specified B group vitamins: Secondary | ICD-10-CM

## 2023-06-13 MED ORDER — CYANOCOBALAMIN 1000 MCG/ML IJ SOLN
1000.0000 ug | Freq: Once | INTRAMUSCULAR | Status: AC
  Administered 2023-06-13: 1000 ug via INTRAMUSCULAR

## 2023-06-13 NOTE — Progress Notes (Signed)
Per orders of Dr. Jordan, injection of Cyanocobalamin 1000mcg given by Maryjean Corpening A. Patient tolerated injection well.  

## 2023-06-16 ENCOUNTER — Other Ambulatory Visit: Payer: Self-pay | Admitting: Family Medicine

## 2023-06-16 DIAGNOSIS — G894 Chronic pain syndrome: Secondary | ICD-10-CM

## 2023-06-16 DIAGNOSIS — M25511 Pain in right shoulder: Secondary | ICD-10-CM | POA: Diagnosis not present

## 2023-06-20 ENCOUNTER — Other Ambulatory Visit: Payer: Self-pay | Admitting: Family Medicine

## 2023-06-20 ENCOUNTER — Other Ambulatory Visit: Payer: Self-pay

## 2023-06-20 DIAGNOSIS — G894 Chronic pain syndrome: Secondary | ICD-10-CM

## 2023-06-20 MED ORDER — HYDROCODONE-ACETAMINOPHEN 5-325 MG PO TABS: 1.0000 | ORAL_TABLET | Freq: Three times a day (TID) | ORAL | 0 refills | Status: DC | PRN

## 2023-06-20 NOTE — Telephone Encounter (Signed)
 Copied from CRM [id number]. Topic: Clinical - Medication Refill >> Jun 20, 2023 10:55 AM Corin V wrote: Most Recent Primary Care Visit:  Provider: [facility]  Department: [facility]  Visit Type: NURSE VISIT  Date: 06/13/2023  Medication: HYDROcodone -acetaminophen  (NORCO/VICODIN) 5-325 MG tablet  Has the patient contacted their pharmacy? Yes, per patient pharmacy has been reaching out to office. (Agent: If no, request that the patient contact the pharmacy for the refill. If patient does not wish to contact the pharmacy document the reason why and proceed with request.) (Agent: If yes, when and what did the pharmacy advise?)  Is this the correct pharmacy for this prescription? Yes If no, delete pharmacy and type the correct one.  This is the patient's preferred pharmacy:  [facility] [place], [place] - [address] [place] [address] Phone: [phone] Fax: [phone]   Has the prescription been filled recently? No  Is the patient out of the medication? Yes  Has the patient been seen for an appointment in the last year OR does the patient have an upcoming appointment? Yes  Can we respond through MyChart? No  Agent: Please be advised that Rx refills may take up to 3 business days. We ask that you follow-up with your pharmacy.

## 2023-06-20 NOTE — Addendum Note (Signed)
 Addended by: Weyman Croon E on: 06/20/2023 11:01 AM   Modules accepted: Orders

## 2023-06-24 DIAGNOSIS — H40023 Open angle with borderline findings, high risk, bilateral: Secondary | ICD-10-CM | POA: Diagnosis not present

## 2023-06-27 ENCOUNTER — Encounter: Payer: Self-pay | Admitting: Family Medicine

## 2023-06-27 ENCOUNTER — Ambulatory Visit (INDEPENDENT_AMBULATORY_CARE_PROVIDER_SITE_OTHER): Payer: Medicare Other | Admitting: Family Medicine

## 2023-06-27 VITALS — BP 136/74 | HR 85 | Resp 16 | Ht 64.0 in | Wt 138.5 lb

## 2023-06-27 DIAGNOSIS — I10 Essential (primary) hypertension: Secondary | ICD-10-CM | POA: Diagnosis not present

## 2023-06-27 DIAGNOSIS — I480 Paroxysmal atrial fibrillation: Secondary | ICD-10-CM | POA: Diagnosis not present

## 2023-06-27 DIAGNOSIS — N1831 Chronic kidney disease, stage 3a: Secondary | ICD-10-CM

## 2023-06-27 DIAGNOSIS — G894 Chronic pain syndrome: Secondary | ICD-10-CM

## 2023-06-27 MED ORDER — HYDROCODONE-ACETAMINOPHEN 5-325 MG PO TABS: 1.0000 | ORAL_TABLET | Freq: Three times a day (TID) | ORAL | 0 refills | Status: DC | PRN

## 2023-06-27 NOTE — Assessment & Plan Note (Signed)
 Overall BP adequately controlled, reporting similar numbers at home and during office visits. Continue irbesartan 150 mg daily and

## 2023-06-27 NOTE — Progress Notes (Signed)
 HPI: Jennifer Goodman is a 89 y.o. female with a PMHx significant for paroxysmal atrial fibrillation, HTN, hypothyroidism, OA, osteoporosis, CKD III, B12 deficiency, and chronic pain disorder, who is here today for chronic disease management.  Last seen on 02/25/2023 She was seen by ENT on 05/19/2023 for chronic sinusitis and reports having blood work done at her rheumatologist's office..   Chronic pain:  Currently on hydrocodone -acetaminophen  5-325 mg 3x daily for pain. She mentions she had an issue getting her medications recently and was unable to take them for a few days. When she didn't have the pain while taking medication bid, she noticed significantly worsened back pain as well as some new pain in her knees and other joints while she was not taking medication.  Pain is moderate to severe and with medication back pain is mild.  She denies any constipation or other side effects.  While she is on hydrocodone -acetaminophen  she does not have significant limitation of daily activities, she does when she does not take medication. Rheumatoid arthritis on methotrexate  and Enbrel . She follows with rheumatologist, Dr. Mai, regularly.  Hypertension:  Medications: Currently on Avapro  150 mg daily.  BP readings at home: She occasionally checks her BP at home, SBT goes from 108-1 130s, occasionally 160s. She isn't sure if her machine is accurate.   Negative for unusual or severe headache, visual changes, exertional chest pain, dyspnea,  focal weakness, or edema. Last CMP in 01/2023. CKD 3: Last blood work done on 02/11/2023 at her rheumatologist's office. Cr 0.9 and e GFR 59. Negative for gross hematuria, foamy urine, or decreased urine output.  Paroxysmal A-fib, she is not on anticoagulation. She is no longer following with cardiologist.  Review of Systems  Constitutional:  Negative for fever.  HENT:  Negative for mouth sores and sore throat.   Respiratory:  Negative for cough and  wheezing.   Gastrointestinal:  Negative for abdominal pain, nausea and vomiting.  Musculoskeletal:  Positive for arthralgias and gait problem.  Skin:  Negative for rash.  Neurological:  Negative for syncope and facial asymmetry.  Psychiatric/Behavioral:  Negative for confusion and hallucinations.   See other pertinent positives and negatives in HPI.  Current Outpatient Medications on File Prior to Visit  Medication Sig Dispense Refill   amoxicillin  (AMOXIL ) 500 MG tablet Take 4 tablet by mouth   BEFORE DENTAL PROCEDURE.     Cholecalciferol (VITAMIN D3) 5000 UNITS TABS Take 5,000 tablets by mouth daily.     cyanocobalamin  (,VITAMIN B-12,) 1000 MCG/ML injection INJECT 1 ML EVERY 3 WEEKS. (Patient taking differently: Inject 1,000 mcg into the muscle. Every 4 weeks.) 4 mL 0   docusate sodium  (COLACE) 100 MG capsule Take 1 capsule (100 mg total) by mouth 2 (two) times daily. (Patient taking differently: Take 100 mg by mouth daily.) 10 capsule 0   etanercept  (ENBREL ) 50 MG/ML injection Inject 50 mg into the skin once a week. Thursdays     fluticasone  (FLONASE ) 50 MCG/ACT nasal spray Place 1 spray into both nostrils 2 (two) times daily. 16 g 6   folic acid  (FOLVITE ) 1 MG tablet Take 1 mg by mouth daily.     irbesartan  (AVAPRO ) 150 MG tablet Take 1 tablet (150 mg total) by mouth daily. 90 tablet 2   levothyroxine  (SYNTHROID ) 50 MCG tablet TAKE ONE TABLET EVERY MORNING ON AN EMPTY STOMACH 90 tablet 3   methotrexate  (RHEUMATREX) 2.5 MG tablet Take 20 mg by mouth every Monday.      nitrofurantoin (MACRODANTIN)  100 MG capsule Take 100 mg by mouth daily. Urologist.     pantoprazole  (PROTONIX ) 20 MG tablet Take 1 tablet (20 mg total) by mouth daily. 30 tablet 3   zoledronic  acid (RECLAST ) 5 MG/100ML SOLN injection Inject 5 mg into the vein once.     No current facility-administered medications on file prior to visit.    Past Medical History:  Diagnosis Date   Anemia    Arthritis    Rhuematoid-takes  Enbrel  and Methorexate    Back pain    states in Jan 2017 spinal was attempted 4 times and has had back pain since.   Cancer (HCC)    skin cancer on head   Chicken pox    Diverticulosis    History of colon polyps    benign   Hypertension    takes Avapro  daily   Hypothyroidism    takes Synthroid  daily   Joint pain    Pneumonia    at age 51   PONV (postoperative nausea and vomiting)    the patch helps!   Urinary frequency    Urinary urgency    was taking Myrbetriq  but stopped.Plans to start back   Allergies  Allergen Reactions   Oxycodone  Other (See Comments)    Makes hallucinates   Sulfamethoxazole Nausea Only    Social History   Socioeconomic History   Marital status: Married    Spouse name: Not on file   Number of children: 2   Years of education: Not on file   Highest education level: Not on file  Occupational History   Occupation: Retired  Tobacco Use   Smoking status: Former    Current packs/day: 0.00    Types: Cigarettes    Quit date: 06/14/1978    Years since quitting: 45.0   Smokeless tobacco: Never   Tobacco comments:    quit 32 yrs ago  Vaping Use   Vaping status: Never Used  Substance and Sexual Activity   Alcohol use: Yes    Comment: occ   Drug use: No   Sexual activity: Never  Other Topics Concern   Not on file  Social History Narrative   She lives in [place] with her husband.   Social Drivers of Corporate Investment Banker Strain: Low Risk  (01/22/2022)   Overall Financial Resource Strain (CARDIA)    Difficulty of Paying Living Expenses: Not hard at all  Food Insecurity: No Food Insecurity (01/22/2022)   Hunger Vital Sign    Worried About Running Out of Food in the Last Year: Never true    Ran Out of Food in the Last Year: Never true  Transportation Needs: No Transportation Needs (01/22/2022)   PRAPARE - Administrator, Civil Service (Medical): No    Lack of Transportation (Non-Medical): No  Physical Activity: Sufficiently  Active (01/22/2022)   Exercise Vital Sign    Days of Exercise per Week: 5 days    Minutes of Exercise per Session: 30 min  Stress: No Stress Concern Present (01/22/2022)   [employer] of Occupational Health - Occupational Stress Questionnaire    Feeling of Stress : Not at all  Social Connections: Socially Integrated (01/22/2022)   Social Connection and Isolation Panel [NHANES]    Frequency of Communication with Friends and Family: More than three times a week    Frequency of Social Gatherings with Friends and Family: More than three times a week    Attends Religious Services: More than 4 times per year  Active Member of Clubs or Organizations: Yes    Attends Banker Meetings: More than 4 times per year    Marital Status: Married   Vitals:   06/27/23 1124  BP: 136/74  Pulse: 85  Resp: 16  SpO2: 98%   Body mass index is 23.77 kg/m.  Physical Exam Vitals and nursing note reviewed.  Constitutional:      General: She is not in acute distress.    Appearance: She is well-developed.  HENT:     Head: Normocephalic and atraumatic.     Mouth/Throat:     Mouth: Mucous membranes are moist.     Pharynx: Uvula midline.  Eyes:     Conjunctiva/sclera: Conjunctivae normal.  Cardiovascular:     Rate and Rhythm: Normal rate and regular rhythm.     Heart sounds: No murmur heard. Pulmonary:     Effort: Pulmonary effort is normal. No respiratory distress.     Breath sounds: Normal breath sounds.  Abdominal:     Palpations: Abdomen is soft.     Tenderness: There is no abdominal tenderness.  Musculoskeletal:     Thoracic back: Deformity (kyphosis) present.     Right lower leg: No edema.     Left lower leg: No edema.  Skin:    General: Skin is warm.     Findings: No erythema or rash.  Neurological:     General: No focal deficit present.     Mental Status: She is alert and oriented to person, place, and time.     Cranial Nerves: No cranial nerve deficit.     Comments:  Antalgic/unstable pain assisted with a cane.  Psychiatric:        Mood and Affect: Mood and affect normal.   ASSESSMENT AND PLAN:  Ms. Snader was seen today for chronic disease management.   Chronic pain disorder Assessment & Plan: Currently on hydrocodone -acetaminophen  5-325 mg 70 tablets/month, she usually takes it twice daily and an extra one if needed. She reports good tolerance, she has been on this medication for a few years and current regimen is still helping with pain. We discussed some side effects. Because of chronic comorbidities, she cannot take NSAIDs. Med contract is current. PDMP reviewed. Prescription for hydrocodone -acetaminophen  5-325 mg # 70 x 3 sent to her pharmacy. Follow up in 5 months.  Orders: -     HYDROcodone -Acetaminophen ; Take 1 tablet by mouth 3 (three) times daily as needed for moderate pain (pain score 4-6) (max 70 tab/month.).  Dispense: 70 tablet; Refill: 0 -     HYDROcodone -Acetaminophen ; Take 1 tablet by mouth 3 (three) times daily as needed for moderate pain (pain score 4-6) (max 70 tab/month.).  Dispense: 70 tablet; Refill: 0 -     HYDROcodone -Acetaminophen ; Take 1 tablet by mouth 3 (three) times daily as needed for moderate pain (pain score 4-6) (max 70 tab/month.).  Dispense: 70 tablet; Refill: 0  Hypertension, essential, benign Assessment & Plan: Overall BP adequately controlled, reporting similar numbers at home and during office visits. Continue irbesartan  150 mg daily and   Stage 3a chronic kidney disease (HCC) Assessment & Plan: Problem has been stable, last blood work on 02/11/2023 with creatinine 0.9 and EGFR 59. Continue low salt diet, adequate hydration,and avoidance of NSAID's. Current on irbesartan . She is having blood work regularly at her rheumatologist's office.   Paroxysmal atrial fibrillation (HCC) Assessment & Plan: Rhythm and otherwise rate controlled. She is not on chronic anticoagulation and no longer following with  cardiologist.  Return in about 5 months (around 11/25/2023).  I, Leonce PARAS Wierda, acting as a scribe for Roslin Norwood, MD., have documented all relevant documentation on the behalf of Izora Benn, MD, as directed by  Vanda Waskey, MD while in the presence of Hasel Janish, MD.   I, Cami Delawder, MD, have reviewed all documentation for this visit. The documentation on 06/27/23 for the exam, diagnosis, procedures, and orders are all accurate and complete.  Kynlea Blackston G. Tamzin Bertling, MD  [facility]. [place] office.

## 2023-06-27 NOTE — Assessment & Plan Note (Signed)
 Problem has been stable, last blood work on 02/11/2023 with creatinine 0.9 and EGFR 59. Continue low salt diet, adequate hydration,and avoidance of NSAID's. Current on irbesartan. She is having blood work regularly at her rheumatologist's office.

## 2023-06-27 NOTE — Patient Instructions (Addendum)
 A few things to remember from today's visit:  Chronic pain disorder  Hypertension, essential, benign  Stage 3a chronic kidney disease (HCC) I am sending 3 prescriptions. No changes today.  If you need refills for medications you take chronically, please call your pharmacy. Do not use My Chart to request refills or for acute issues that need immediate attention. If you send a my chart message, it may take a few days to be addressed, specially if I am not in the office.  Please be sure medication list is accurate. If a new problem present, please set up appointment sooner than planned today.

## 2023-06-27 NOTE — Assessment & Plan Note (Addendum)
 Rhythm and otherwise rate controlled. She is not on chronic anticoagulation and no longer following with cardiologist.

## 2023-06-27 NOTE — Assessment & Plan Note (Signed)
 Currently on hydrocodone -acetaminophen  5-325 mg 70 tablets/month, she usually takes it twice daily and an extra one if needed. She reports good tolerance, she has been on this medication for a few years and current regimen is still helping with pain. We discussed some side effects. Because of chronic comorbidities, she cannot take NSAIDs. Med contract is current. PDMP reviewed. Prescription for hydrocodone -acetaminophen  5-325 mg # 70 x 3 sent to her pharmacy. Follow up in 5 months.

## 2023-07-14 ENCOUNTER — Ambulatory Visit: Payer: Medicare Other

## 2023-07-14 DIAGNOSIS — E538 Deficiency of other specified B group vitamins: Secondary | ICD-10-CM | POA: Diagnosis not present

## 2023-07-14 MED ORDER — CYANOCOBALAMIN 1000 MCG/ML IJ SOLN
1000.0000 ug | Freq: Once | INTRAMUSCULAR | Status: AC
  Administered 2023-07-14: 1000 ug via INTRAMUSCULAR

## 2023-07-14 NOTE — Progress Notes (Signed)
Per orders of Dr. Salomon Fick, injection of B12 given by Vickii Chafe on Left Deltoid. Patient tolerated injection well.

## 2023-08-12 ENCOUNTER — Ambulatory Visit (INDEPENDENT_AMBULATORY_CARE_PROVIDER_SITE_OTHER): Payer: Medicare Other

## 2023-08-12 DIAGNOSIS — E538 Deficiency of other specified B group vitamins: Secondary | ICD-10-CM

## 2023-08-12 MED ORDER — CYANOCOBALAMIN 1000 MCG/ML IJ SOLN
1000.0000 ug | Freq: Once | INTRAMUSCULAR | Status: AC
  Administered 2023-08-12: 1000 ug via INTRAMUSCULAR

## 2023-08-12 NOTE — Progress Notes (Signed)
Per orders of Dr. Jordan, injection of B12 given by Malayia Spizzirri E Leira Regino. Patient tolerated injection well.  

## 2023-08-26 ENCOUNTER — Other Ambulatory Visit: Payer: Self-pay | Admitting: Family Medicine

## 2023-08-26 DIAGNOSIS — Z79899 Other long term (current) drug therapy: Secondary | ICD-10-CM | POA: Diagnosis not present

## 2023-08-26 DIAGNOSIS — M47812 Spondylosis without myelopathy or radiculopathy, cervical region: Secondary | ICD-10-CM | POA: Diagnosis not present

## 2023-08-26 DIAGNOSIS — M0589 Other rheumatoid arthritis with rheumatoid factor of multiple sites: Secondary | ICD-10-CM | POA: Diagnosis not present

## 2023-08-26 DIAGNOSIS — S86012D Strain of left Achilles tendon, subsequent encounter: Secondary | ICD-10-CM | POA: Diagnosis not present

## 2023-08-26 DIAGNOSIS — Z6824 Body mass index (BMI) 24.0-24.9, adult: Secondary | ICD-10-CM | POA: Diagnosis not present

## 2023-08-26 DIAGNOSIS — M722 Plantar fascial fibromatosis: Secondary | ICD-10-CM | POA: Diagnosis not present

## 2023-08-26 DIAGNOSIS — J018 Other acute sinusitis: Secondary | ICD-10-CM | POA: Diagnosis not present

## 2023-08-26 DIAGNOSIS — M810 Age-related osteoporosis without current pathological fracture: Secondary | ICD-10-CM | POA: Diagnosis not present

## 2023-08-26 DIAGNOSIS — R5383 Other fatigue: Secondary | ICD-10-CM | POA: Diagnosis not present

## 2023-08-26 DIAGNOSIS — E039 Hypothyroidism, unspecified: Secondary | ICD-10-CM

## 2023-08-26 DIAGNOSIS — M25512 Pain in left shoulder: Secondary | ICD-10-CM | POA: Diagnosis not present

## 2023-08-26 DIAGNOSIS — M1991 Primary osteoarthritis, unspecified site: Secondary | ICD-10-CM | POA: Diagnosis not present

## 2023-09-29 ENCOUNTER — Ambulatory Visit (INDEPENDENT_AMBULATORY_CARE_PROVIDER_SITE_OTHER)

## 2023-09-29 DIAGNOSIS — E538 Deficiency of other specified B group vitamins: Secondary | ICD-10-CM

## 2023-09-29 MED ORDER — CYANOCOBALAMIN 1000 MCG/ML IJ SOLN
1000.0000 ug | Freq: Once | INTRAMUSCULAR | Status: AC
  Administered 2023-09-29: 1000 ug via INTRAMUSCULAR

## 2023-09-29 NOTE — Progress Notes (Signed)
 Patient is in office today for a nurse visit for B12 Injection. Patient Injection was given in the  Left deltoid. Patient tolerated injection well.

## 2023-10-28 ENCOUNTER — Ambulatory Visit

## 2023-10-28 DIAGNOSIS — E538 Deficiency of other specified B group vitamins: Secondary | ICD-10-CM

## 2023-10-28 MED ORDER — CYANOCOBALAMIN 1000 MCG/ML IJ SOLN
1000.0000 ug | Freq: Once | INTRAMUSCULAR | Status: AC
  Administered 2023-10-28: 1000 ug via INTRAMUSCULAR

## 2023-10-28 NOTE — Progress Notes (Signed)
 Patient is in office today for a nurse visit for B12 Injection. Patient Injection was given in the  Left deltoid. Patient tolerated injection well.

## 2023-11-10 ENCOUNTER — Other Ambulatory Visit: Payer: Self-pay | Admitting: Family Medicine

## 2023-11-10 DIAGNOSIS — G894 Chronic pain syndrome: Secondary | ICD-10-CM

## 2023-11-10 NOTE — Telephone Encounter (Unsigned)
 Copied from CRM [id number]. Topic: Clinical - Medication Refill >> Nov 10, 2023  9:17 AM Dorisann Garre T wrote: Medication: HYDROcodone -acetaminophen  (NORCO/VICODIN) 5-325 MG tablet [[id number]]  Has the patient contacted their pharmacy? Yes (Agent: If no, request that the patient contact the pharmacy for the refill. If patient does not wish to contact the pharmacy document the reason why and proceed with request.) (Agent: If yes, when and what did the pharmacy advise?)  This is the patient's preferred pharmacy:  [facility] [place], [place] - [address] [place] [address] Phone: [phone] Fax: [phone]  Is this the correct pharmacy for this prescription? Yes If no, delete pharmacy and type the correct one.   Has the prescription been filled recently? No  Is the patient out of the medication? Yes  Has the patient been seen for an appointment in the last year OR does the patient have an upcoming appointment? No  Can we respond through MyChart? no  Agent: Please be advised that Rx refills may take up to 3 business days. We ask that you follow-up with your pharmacy.

## 2023-11-11 MED ORDER — HYDROCODONE-ACETAMINOPHEN 5-325 MG PO TABS: 1.0000 | ORAL_TABLET | Freq: Three times a day (TID) | ORAL | 0 refills | Status: DC | PRN

## 2023-11-25 ENCOUNTER — Encounter: Payer: Self-pay | Admitting: Family Medicine

## 2023-11-25 ENCOUNTER — Ambulatory Visit (INDEPENDENT_AMBULATORY_CARE_PROVIDER_SITE_OTHER): Payer: Medicare Other | Admitting: Family Medicine

## 2023-11-25 VITALS — BP 136/70 | HR 94 | Resp 16 | Ht 64.0 in | Wt 139.0 lb

## 2023-11-25 DIAGNOSIS — E039 Hypothyroidism, unspecified: Secondary | ICD-10-CM | POA: Diagnosis not present

## 2023-11-25 DIAGNOSIS — E538 Deficiency of other specified B group vitamins: Secondary | ICD-10-CM | POA: Diagnosis not present

## 2023-11-25 DIAGNOSIS — M79605 Pain in left leg: Secondary | ICD-10-CM

## 2023-11-25 DIAGNOSIS — G894 Chronic pain syndrome: Secondary | ICD-10-CM | POA: Diagnosis not present

## 2023-11-25 DIAGNOSIS — M79604 Pain in right leg: Secondary | ICD-10-CM | POA: Diagnosis not present

## 2023-11-25 DIAGNOSIS — I10 Essential (primary) hypertension: Secondary | ICD-10-CM

## 2023-11-25 MED ORDER — CYANOCOBALAMIN 1000 MCG/ML IJ SOLN: 1000.0000 ug | Freq: Once | INTRAMUSCULAR | Status: AC

## 2023-11-25 MED ORDER — HYDROCODONE-ACETAMINOPHEN 5-325 MG PO TABS: 1.0000 | ORAL_TABLET | Freq: Three times a day (TID) | ORAL | 0 refills | Status: DC | PRN

## 2023-11-25 NOTE — Progress Notes (Unsigned)
 HPI: [name] is a 89 y.o. female with a PMHx significant for paroxysmal atrial fibrillation, HTN, hypothyroidism, OA, osteoporosis, CKD III, B12 deficiency, and chronic pain disorder, who is here today for chronic disease management.  Last seen on 06/27/2023  Chronic pain:  Currently on Hydrocodone -acetaminophen  5-325 mg 2-3 times daily as needed. She mentions she has had some problems having her prescriptions filled.  She has been on same medication for years and really helps with pain. RA and generalized OA, medication was started by her rheumatologist.  Without pain medications her pain is moderate to severe, limiting daily activities. When she takes Hydrocodone -Acetaminophen  her back pain is mild.  She denies any major constipation or other side effects. She was advised by gastroenterology to take Colace daily, and has bowel movements  every day while taking it.   Rheumatoid arthritis on methotrexate  and Enbrel . She follows with rheumatologist, Dr. Ebbie Goldmann, every 6 months.   Patient complains of occasional weakness in her legs in the mornings when she first gets up and with prolonged standing, afraid of falling. She has had this problem for about a month.  She says she had an episode where she almost fell getting into a car, but was able to call for help from her daughter and prevent a fall.  Unstable gait, she uses a cane.  Hypertension:  Medications: Currently on irbesartan  150 mg daily.  She occasionally checks her BP at home.  Negative for unusual or severe headache, visual changes, exertional chest pain, dyspnea, palpitations, focal weakness, or edema.  Lab Results  Component Value Date   CREATININE 0.94 03/23/2022   BUN 18 03/23/2022   NA 138 03/23/2022   K 4.0 03/23/2022   CL 104 03/23/2022   CO2 25 03/23/2022   Hypothyroidism:  Dx'ed in 2010. Currently on levothyroxine  50 mcg daily.  Lab Results  Component Value Date   TSH 3.38 09/23/2021   B12  deficiency: Currently she is on B12 at 1000 mcg IM every 4 to 5 weeks.She would like her B12 injection today, due in a few days, so she does not have to come back.  Lab Results  Component Value Date   VITAMINB12 635 09/23/2021   Review of Systems  Constitutional:  Negative for activity change, appetite change and fever.  HENT:  Negative for sore throat.   Respiratory:  Negative for cough and wheezing.   Gastrointestinal:  Negative for abdominal pain, nausea and vomiting.  Genitourinary:  Negative for decreased urine volume, dysuria and hematuria.  Musculoskeletal:  Positive for arthralgias, back pain and gait problem.  Skin:  Negative for rash.  Neurological:  Negative for syncope and facial asymmetry.  See other pertinent positives and negatives in HPI.  Current Outpatient Medications on File Prior to Visit  Medication Sig Dispense Refill   amoxicillin  (AMOXIL ) 500 MG tablet Take 4 tablet by mouth   BEFORE DENTAL PROCEDURE.     Cholecalciferol (VITAMIN D3) 5000 UNITS TABS Take 5,000 tablets by mouth daily.     cyanocobalamin  (,VITAMIN B-12,) 1000 MCG/ML injection INJECT 1 ML EVERY 3 WEEKS. (Patient taking differently: Inject 1,000 mcg into the muscle. Every 4 weeks.) 4 mL 0   docusate sodium  (COLACE) 100 MG capsule Take 1 capsule (100 mg total) by mouth 2 (two) times daily. (Patient taking differently: Take 100 mg by mouth daily.) 10 capsule 0   etanercept  (ENBREL ) 50 MG/ML injection Inject 50 mg into the skin once a week. Thursdays     fluticasone  (FLONASE )  50 MCG/ACT nasal spray Place 1 spray into both nostrils 2 (two) times daily. 16 g 6   folic acid  (FOLVITE ) 1 MG tablet Take 1 mg by mouth daily.     irbesartan  (AVAPRO ) 150 MG tablet Take 1 tablet (150 mg total) by mouth daily. 90 tablet 2   levothyroxine  (SYNTHROID ) 50 MCG tablet TAKE ONE TABLET EVERY MORNING ON AN EMPTY STOMACH 90 tablet 3   methotrexate  (RHEUMATREX) 2.5 MG tablet Take 20 mg by mouth every Monday.       nitrofurantoin (MACRODANTIN) 100 MG capsule Take 100 mg by mouth daily. Urologist.     pantoprazole  (PROTONIX ) 20 MG tablet Take 1 tablet (20 mg total) by mouth daily. 30 tablet 3   zoledronic  acid (RECLAST ) 5 MG/100ML SOLN injection Inject 5 mg into the vein once.     No current facility-administered medications on file prior to visit.    Past Medical History:  Diagnosis Date   Anemia    Arthritis    Rhuematoid-takes Enbrel  and Methorexate    Back pain    states in Jan 2017 spinal was attempted 4 times and has had back pain since.   Cancer (HCC)    skin cancer on head   Chicken pox    Diverticulosis    History of colon polyps    benign   Hypertension    takes Avapro  daily   Hypothyroidism    takes Synthroid  daily   Joint pain    Pneumonia    at age 39   PONV (postoperative nausea and vomiting)    the patch helps!   Urinary frequency    Urinary urgency    was taking Myrbetriq  but stopped.Plans to start back   Allergies  Allergen Reactions   Oxycodone  Other (See Comments)    Makes hallucinates   Sulfamethoxazole Nausea Only    Social History   Socioeconomic History   Marital status: Married    Spouse name: Not on file   Number of children: 2   Years of education: Not on file   Highest education level: Not on file  Occupational History   Occupation: Retired  Tobacco Use   Smoking status: Former    Current packs/day: 0.00    Types: Cigarettes    Quit date: 06/14/1978    Years since quitting: 45.4   Smokeless tobacco: Never   Tobacco comments:    quit 32 yrs ago  Vaping Use   Vaping status: Never Used  Substance and Sexual Activity   Alcohol use: Yes    Comment: occ   Drug use: No   Sexual activity: Never  Other Topics Concern   Not on file  Social History Narrative   She lives in [place] with her husband.   Social Drivers of Corporate investment banker Strain: Low Risk  (01/22/2022)   Overall Financial Resource Strain (CARDIA)    Difficulty of  Paying Living Expenses: Not hard at all  Food Insecurity: No Food Insecurity (01/22/2022)   Hunger Vital Sign    Worried About Running Out of Food in the Last Year: Never true    Ran Out of Food in the Last Year: Never true  Transportation Needs: No Transportation Needs (01/22/2022)   PRAPARE - Administrator, Civil Service (Medical): No    Lack of Transportation (Non-Medical): No  Physical Activity: Sufficiently Active (01/22/2022)   Exercise Vital Sign    Days of Exercise per Week: 5 days    Minutes of  Exercise per Session: 30 min  Stress: No Stress Concern Present (01/22/2022)   [employer] of Occupational Health - Occupational Stress Questionnaire    Feeling of Stress : Not at all  Social Connections: Socially Integrated (01/22/2022)   Social Connection and Isolation Panel    Frequency of Communication with Friends and Family: More than three times a week    Frequency of Social Gatherings with Friends and Family: More than three times a week    Attends Religious Services: More than 4 times per year    Active Member of [employer] or Organizations: Yes    Attends Banker Meetings: More than 4 times per year    Marital Status: Married   Vitals:   11/25/23 1255  BP: 136/70  Pulse: 94  Resp: 16  SpO2: 97%   Body mass index is 23.86 kg/m.  Physical Exam Vitals and nursing note reviewed.  Constitutional:      General: She is not in acute distress.    Appearance: She is well-developed.  HENT:     Head: Normocephalic and atraumatic.     Mouth/Throat:     Mouth: Mucous membranes are moist.   Eyes:     Conjunctiva/sclera: Conjunctivae normal.    Cardiovascular:     Rate and Rhythm: Normal rate and regular rhythm.     Pulses:          Dorsalis pedis pulses are 2+ on the right side and 2+ on the left side.     Heart sounds: No murmur heard.    Comments: Trace bilateral pitting edema, bilateral. Pulmonary:     Effort: Pulmonary effort is normal. No  respiratory distress.     Breath sounds: Normal breath sounds.  Abdominal:     Palpations: Abdomen is soft. There is no mass.     Tenderness: There is no abdominal tenderness.   Musculoskeletal:     Comments: No signs of synovitis. A few mild deformities on back (kyphosis) and MCP joints.   Skin:    General: Skin is warm.     Findings: No erythema or rash.   Neurological:     General: No focal deficit present.     Mental Status: She is alert and oriented to person, place, and time.     Comments: Unstable gait assisted with a cane.  Psychiatric:        Mood and Affect: Mood and affect normal.    ASSESSMENT AND PLAN:  Ms. Show was seen today for chronic disease management.   Orders Placed This Encounter  Procedures   TSH   T4, free   Lab Results  Component Value Date   TSH 3.190 11/25/2023   Pain in both lower extremities In regard to lower extremity weakness sensation in the morning and after prolonged rest/walking, we discussed possible etiologies, it could be related to OA. He prefers to hold on PT, planning on following with her orthopedist and also sees rheumatology regularly. Fall precautions discussed.  Hypothyroidism (acquired) Assessment & Plan: Last TSH 3.3 in 09/2021. Currently on levothyroxine  50 mcg daily. Further recommendation will be given according to TSH result.  Orders: -     TSH; Future -     T4, free; Future  Hypertension, essential, benign Assessment & Plan: BP adequately controlled. Continue irbesartan  150 mg daily and low-salt diet. Eye exam is current.  Chronic pain disorder Assessment & Plan: Currently on hydrocodone -acetaminophen  5-325 mg 70 tablets/month, she usually takes it twice daily and an extra  one if needed. She has been on med for years and it helps with pain. We discussed some side effects. She cannot take NSAIDs, hx of CKD and HTN. PDMP reviewed. Follow up in 5-6 months.  B12 deficiency Assessment & Plan: After  verbal consent today she will receive B12 at 1000 mcg IM x 1. Continue treatments every 4 to 6 weeks.  Orders: -     Cyanocobalamin   Return in about 6 months (around 05/26/2024) for chronic problems.  I, Fritz Jewel Wierda, acting as a scribe for Rickia Freeburg [place], MD., have documented all relevant documentation on the behalf of Jennifer Devin [place], MD, as directed by  Electra Paladino [place], MD while in the presence of Jennifer Grether [place], MD.   I, Ghada Abbett [place], MD, have reviewed all documentation for this visit. The documentation on 11/25/23 for the exam, diagnosis, procedures, and orders are all accurate and complete.  Bristyl Mclees G. [place], MD  [facility]. [place] office.

## 2023-11-25 NOTE — Assessment & Plan Note (Signed)
 BP adequately controlled. Continue irbesartan  150 mg daily and low-salt diet. Eye exam is current.

## 2023-11-25 NOTE — Patient Instructions (Addendum)
 A few things to remember from today's visit:  Hypothyroidism (acquired) - Plan: TSH, T4, free  Hypertension, essential, benign  Chronic pain disorder No changes today.  If you need refills for medications you take chronically, please call your pharmacy. Do not use My Chart to request refills or for acute issues that need immediate attention. If you send a my chart message, it may take a few days to be addressed, specially if I am not in the office.  Please be sure medication list is accurate. If a new problem present, please set up appointment sooner than planned today.

## 2023-11-25 NOTE — Assessment & Plan Note (Signed)
 After verbal consent today she will receive B12 at 1000 mcg IM x 1. Continue treatments every 4 to 6 weeks.

## 2023-11-25 NOTE — Assessment & Plan Note (Signed)
 Last TSH 3.3 in 09/2021. Currently on levothyroxine  50 mcg daily. Further recommendation will be given according to TSH result.

## 2023-11-25 NOTE — Assessment & Plan Note (Addendum)
 Problem is adequately controlled with current regimen. Currently on hydrocodone -acetaminophen  5-325 mg 70 tablets/month, she usually takes it twice daily and an extra one if needed. We discussed some side effects. Rx x 3 sent. She cannot take NSAIDs, hx of CKD and HTN. PDMP reviewed. Med contract 02/2023. Follow up in 5-6 months.

## 2023-11-26 ENCOUNTER — Ambulatory Visit: Payer: Self-pay | Admitting: Family Medicine

## 2023-11-26 LAB — TSH: TSH: 3.19 u[IU]/mL (ref 0.450–4.500)

## 2023-11-26 LAB — T4, FREE: Free T4: 1.34 ng/dL (ref 0.82–1.77)

## 2023-11-28 ENCOUNTER — Ambulatory Visit

## 2023-11-28 DIAGNOSIS — M79671 Pain in right foot: Secondary | ICD-10-CM | POA: Diagnosis not present

## 2023-11-28 DIAGNOSIS — M25571 Pain in right ankle and joints of right foot: Secondary | ICD-10-CM | POA: Diagnosis not present

## 2023-11-29 ENCOUNTER — Encounter: Payer: Self-pay | Admitting: Family Medicine

## 2023-11-29 ENCOUNTER — Ambulatory Visit (INDEPENDENT_AMBULATORY_CARE_PROVIDER_SITE_OTHER): Admitting: Family Medicine

## 2023-11-29 DIAGNOSIS — Z0000 Encounter for general adult medical examination without abnormal findings: Secondary | ICD-10-CM

## 2023-11-29 NOTE — Patient Instructions (Signed)
 I really enjoyed getting to talk with you today! I am available on Tuesdays and Thursdays for virtual visits if you have any questions or concerns, or if I can be of any further assistance.   CHECKLIST FROM ANNUAL WELLNESS VISIT:  -Follow up (please call to schedule if not scheduled after visit):   -yearly for annual wellness visit with primary care office  Here is a list of your preventive care/health maintenance measures and the plan for each if any are due:  PLAN For any measures below that may be due:    1. Can get the vaccines at the pharmacy. If you do, please get a copy of receipt to our office so that we can update your immunization record. Thank you.  Health Maintenance  Topic Date Due   DTaP/Tdap/Td (1 - Tdap) Never done   COVID-19 Vaccine (6 - 2024-25 season) 12/15/2023 (Originally 02/13/2023)   INFLUENZA VACCINE  01/13/2024   Medicare Annual Wellness (AWV)  11/28/2024   DEXA SCAN  Completed   HPV VACCINES  Aged Out   Meningococcal B Vaccine  Aged Out   Pneumococcal Vaccine: 50+ Years  Discontinued   Zoster Vaccines- Shingrix  Discontinued    -See a dentist at least yearly  -Get your eyes checked and then per your eye specialist's recommendations  -Other issues addressed today:   -I have included below further information regarding a healthy whole foods based diet, physical activity guidelines for adults, stress management and opportunities for social connections. I hope you find this information useful.   -----------------------------------------------------------------------------------------------------------------------------------------------------------------------------------------------------------------------------------------------------------    NUTRITION: -eat real food: lots of colorful vegetables (half the plate) and fruits -5-7 servings of vegetables and fruits per day (fresh or steamed is best), exp. 2 servings of vegetables with lunch and dinner and  2 servings of fruit per day. Berries and greens such as kale and collards are great choices.  -consume on a regular basis:  fresh fruits, fresh veggies, fish, nuts, seeds, healthy oils (such as olive oil, avocado oil), whole grains (make sure for bread/pasta/crackers/etc., that the first ingredient on label contains the word whole), legumes. -can eat small amounts of dairy and lean meat (no larger than the palm of your hand), but avoid processed meats such as ham, bacon, lunch meat, etc. -drink water -try to avoid fast food and pre-packaged foods, processed meat, ultra processed foods/beverages (donuts, candy, etc.) -most experts advise limiting sodium to < 2300mg  per day, should limit further is any chronic conditions such as high blood pressure, heart disease, diabetes, etc. The American Heart Association advised that < 1500mg  is is ideal -try to avoid foods/beverages that contain any ingredients with names you do not recognize  -try to avoid foods/beverages  with added sugar or sweeteners/sweets  -try to avoid sweet drinks (including diet drinks): soda, juice, Gatorade, sweet tea, power drinks, diet drinks -try to avoid white rice, white bread, pasta (unless whole grain)  EXERCISE GUIDELINES FOR ADULTS: -if you wish to increase your physical activity, do so gradually and with the approval of your doctor -STOP and seek medical care immediately if you have any chest pain, chest discomfort or trouble breathing when starting or increasing exercise  -move and stretch your body, legs, feet and arms when sitting for long periods -Physical activity guidelines for optimal health in adults: -get at least 150 minutes per week of moderate exercise (can talk, but not sing); this is about 20-30 minutes of sustained activity 5-7 days per week or two 10-15 minute episodes  of sustained activity 5-7 days per week -do some muscle building/resistance training/strength training at least 2 days per week  -balance  exercises 3+ days per week:   Stand somewhere where you have something sturdy to hold onto if you lose balance    1) lift up on toes, then back down, start with 5x per day and work up to 20x   2) stand and lift one leg straight out to the side so that foot is a few inches of the floor, start with 5x each side and work up to 20x each side   3) stand on one foot, start with 5 seconds each side and work up to 20 seconds on each side  If you need ideas or help with getting more active:  -Silver sneakers https://tools.silversneakers.com  -Walk with a Doc: http://www.duncan-williams.com/  -try to include resistance (weight lifting/strength building) and balance exercises twice per week: or the following link for ideas: http://castillo-powell.com/  BuyDucts.dk  STRESS MANAGEMENT: -can try meditating, or just sitting quietly with deep breathing while intentionally relaxing all parts of your body for 5 minutes daily -if you need further help with stress, anxiety or depression please follow up with your primary doctor or contact the wonderful folks at [facility]: [phone]  SOCIAL CONNECTIONS: -options in [place] if you wish to engage in more social and exercise related activities:  -Silver sneakers https://tools.silversneakers.com  -Walk with a Doc: http://www.duncan-williams.com/  -Check out the [facility] Active Adults 50+ section on the [place] [employer] (hiking clubs, book clubs, cards and games, chess, exercise classes, aquatic classes and much more) - see the website for details: https://www.[place].gov/departments/parks-recreation/active-adults50  -YouTube has lots of exercise videos for different ages and abilities as well  -[facility] (a variety of indoor and outdoor inperson activities for adults). [phone]. [address]  -Virtual Online  Classes (a variety of topics): see seniorplanet.org or call [phone]  -consider volunteering at a school, hospice center, church, senior center or elsewhere

## 2023-11-29 NOTE — Progress Notes (Signed)
 Patient was unable to self-report due to a lack of equipment at home via telehealth

## 2023-11-29 NOTE — Progress Notes (Signed)
 PATIENT CHECK-IN and HEALTH RISK ASSESSMENT QUESTIONNAIRE:  -completed by phone/video for upcoming Medicare Preventive Visit Pre-Visit Check-in: 1)Vitals (height, wt, BP, etc) - record in vitals section for visit on day of visit Request home vitals (wt, BP, etc.) and enter into vitals, THEN update Vital Signs SmartPhrase below at the top of the HPI. See below.  2)Review and Update Medications, Allergies PMH, Surgeries, Social history in Epic 3)Hospitalizations in the last year with date/reason? NO   4)Review and Update Care Team (patient's specialists) in Epic 5) Complete PHQ9 in Epic  6) Complete Fall Screening in Epic 7)Review all Health Maintenance Due and order under PCP if not done.  Medicare Wellness Patient Questionnaire:  Answer theses question about your habits: How often do you have a drink containing alcohol?Yes, 2 times a month  How many drinks containing alcohol do you have on a typical day when you are drinking?1 drink  How often do you have six or more drinks on one occasion?never  Have you ever smoked?yes  Quit date if applicable? 42 years   How many packs a day do/did you smoke? 1 pack a day  Do you use smokeless tobacco?No  Do you use an illicit drugs?No  On average, how many days per week do you engage in moderate to strenuous exercise (like a brisk walk)?No, seeing doc for achilles injury and is in a boot - she plans to get back to walking after out of boot, used to walk 3 miles per day On average, how many minutes do you engage in exercise at this level?NA  Are you sexually active? No Number of partners?Na  Typical breakfast: english muffin, coffee, banana,  Typical lunch:sandwich, soup Typical dinner:Beans, slaw, salad, vegetables, bread  Typical snacks: none   Beverages: Coffee, iced tea, water   Answer theses question about your everyday activities: Can you perform most household chores?Yes  Are you deaf or have significant trouble hearing?NO  Do you feel  that you have a problem with memory?NO  Do you feel safe at home?yes  Last dentist visit?6 months ago  8. Do you have any difficulty performing your everyday activities?yes, back pain  Are you having any difficulty walking, taking medications on your own, and or difficulty managing daily home needs?No  Do you have difficulty walking or climbing stairs?NO  Do you have difficulty dressing or bathing?No  Do you have difficulty doing errands alone such as visiting a doctor's office or shopping?No  Do you currently have any difficulty preparing food and eating?No  Do you currently have any difficulty using the toilet?No  Do you have any difficulty managing your finances?No Do you have any difficulties with housekeeping of managing your housekeeping?No    Do you have Advanced Directives in place (Living Will, Healthcare Power or Attorney)? Yes    Last eye Exam and location?Jan 2025, Dr. Joanne Muckle    Do you currently use prescribed or non-prescribed narcotic or opioid pain medications?Yes   Do you have a history or close family history of breast, ovarian, tubal or peritoneal cancer or a family member with BRCA (breast cancer susceptibility 1 and 2) gene mutations?No    Nurse/Assistant Credentials/time stamp:Leah A.Wright CMA 3:10pm     ----------------------------------------------------------------------------------------------------------------------------------------------------------------------------------------------------------------------  Because this visit was a virtual/telehealth visit, some criteria may be missing or patient reported. Any vitals not documented were not able to be obtained and vitals that have been documented are patient reported.    MEDICARE ANNUAL PREVENTIVE VISIT WITH PROVIDER: (Welcome to Medicare, initial annual  wellness or annual wellness exam)  Virtual Visit via Video Note  I connected with Helena Loach on 11/29/23 by a video enabled telemedicine  application and verified that I am speaking with the correct person using two identifiers.  Location patient: home Location provider:work or home office Persons participating in the virtual visit: patient, provider  Concerns and/or follow up today: reports is doing ok, saw Dr. Swaziland recently and had thyroid  check.    See HM section in Epic for other details of completed HM.    ROS: negative for report of fevers, unintentional weight loss, vision changes, vision loss, hearing loss or change, chest pain, sob, hemoptysis, melena, hematochezia, hematuria, falls, bleeding or bruising, thoughts of suicide or self harm, memory loss  Patient-completed extensive health risk assessment - reviewed and discussed with the patient: See Health Risk Assessment completed with patient prior to the visit either above or in recent phone note. This was reviewed in detailed with the patient today and appropriate recommendations, orders and referrals were placed as needed per Summary below and patient instructions.   Review of Medical History: -PMH, PSH, Family History and current specialty and care providers reviewed and updated and listed below   Patient Care Team: [place], Betty G, MD as PCP - General (Family Medicine) Tami Falcon, MD as Attending Physician (Gastroenterology) Carnell Christian, [facility] (Pharmacist)   Past Medical History:  Diagnosis Date   Anemia    Arthritis    Rhuematoid-takes Enbrel  and Methorexate    Back pain    states in Jan 2017 spinal was attempted 4 times and has had back pain since.   Cancer (HCC)    skin cancer on head   Chicken pox    Diverticulosis    History of colon polyps    benign   Hypertension    takes Avapro  daily   Hypothyroidism    takes Synthroid  daily   Joint pain    Pneumonia    at age 70   PONV (postoperative nausea and vomiting)    the patch helps!   Urinary frequency    Urinary urgency    was taking Myrbetriq  but stopped.Plans to start back     Past Surgical History:  Procedure Laterality Date   ACHILLES TENDON SURGERY Left 11/17/2017   Procedure: ACHILLES TENDON REPAIR;  Surgeon: Dayne Even, MD;  Location: [facility] OR;  Service: Orthopedics;  Laterality: Left;   CHOLECYSTECTOMY  2000   COLONOSCOPY     EYE SURGERY Left    tear duct surgery   HAND TENDON SURGERY     Dr. Aloha Arnold   KNEE ARTHROSCOPY     ORIF CALCANEOUS FRACTURE Left 11/17/2017   Procedure: OPEN TREATMENT OF CALCANEOUS FRACTURE;  Surgeon: Dayne Even, MD;  Location: [facility] OR;  Service: Orthopedics;  Laterality: Left;  Patient is now an inpatient at [facility] will request that she be moved to [facility] and admitted there for this procedure to be done there.   ORIF ELBOW FRACTURE Right 02/25/2017   Procedure: OPEN REDUCTION INTERNAL FIXATION (ORIF) RIGHT PROXIMAL OLECRANON FRACTURE;  Surgeon: Arvil Birks, MD;  Location: [facility] OR;  Service: Orthopedics;  Laterality: Right;   TOTAL KNEE ARTHROPLASTY Left 06/20/2015   TOTAL KNEE ARTHROPLASTY Left 06/20/2015   Procedure: TOTAL KNEE ARTHROPLASTY;  Surgeon: Neil Balls, MD;  Location: [facility] OR;  Service: Orthopedics;  Laterality: Left;   TOTAL KNEE ARTHROPLASTY Right 05/28/2016   Procedure: TOTAL KNEE ARTHROPLASTY;  Surgeon: Neil Balls, MD;  Location: [facility] OR;  Service: Orthopedics;  Laterality: Right;    Social History   Socioeconomic History   Marital status: Married    Spouse name: Not on file   Number of children: 2   Years of education: Not on file   Highest education level: Not on file  Occupational History   Occupation: Retired  Tobacco Use   Smoking status: Former    Current packs/day: 0.00    Types: Cigarettes    Quit date: 06/14/1978    Years since quitting: 45.4   Smokeless tobacco: Never   Tobacco comments:    quit 32 yrs ago  Vaping Use   Vaping status: Never Used  Substance and Sexual Activity   Alcohol use: Yes    Comment: occ   Drug use: No   Sexual activity: Never  Other Topics Concern   Not on file  Social  History Narrative   She lives in [place] with her husband.   Social Drivers of Corporate investment banker Strain: Low Risk  (01/22/2022)   Overall Financial Resource Strain (CARDIA)    Difficulty of Paying Living Expenses: Not hard at all  Food Insecurity: No Food Insecurity (01/22/2022)   Hunger Vital Sign    Worried About Running Out of Food in the Last Year: Never true    Ran Out of Food in the Last Year: Never true  Transportation Needs: No Transportation Needs (01/22/2022)   PRAPARE - Administrator, Civil Service (Medical): No    Lack of Transportation (Non-Medical): No  Physical Activity: Sufficiently Active (01/22/2022)   Exercise Vital Sign    Days of Exercise per Week: 5 days    Minutes of Exercise per Session: 30 min  Stress: No Stress Concern Present (01/22/2022)   [employer] of Occupational Health - Occupational Stress Questionnaire    Feeling of Stress : Not at all  Social Connections: Socially Integrated (01/22/2022)   Social Connection and Isolation Panel    Frequency of Communication with Friends and Family: More than three times a week    Frequency of Social Gatherings with Friends and Family: More than three times a week    Attends Religious Services: More than 4 times per year    Active Member of [employer] or Organizations: Yes    Attends Engineer, structural: More than 4 times per year    Marital Status: Married  Catering manager Violence: Not At Risk (01/22/2022)   Humiliation, Afraid, Rape, and Kick questionnaire    Fear of Current or Ex-Partner: No    Emotionally Abused: No    Physically Abused: No    Sexually Abused: No    Family History  Problem Relation Age of Onset   Cancer Father        colon ca/ lung ca   Heart disease Father     Current Outpatient Medications on File Prior to Visit  Medication Sig Dispense Refill   Cholecalciferol (VITAMIN D3) 5000 UNITS TABS Take 5,000 tablets by mouth daily.     cyanocobalamin   (,VITAMIN B-12,) 1000 MCG/ML injection INJECT 1 ML EVERY 3 WEEKS. (Patient taking differently: Inject 1,000 mcg into the muscle. Every 4 weeks.) 4 mL 0   docusate sodium  (COLACE) 100 MG capsule Take 1 capsule (100 mg total) by mouth 2 (two) times daily. (Patient taking differently: Take 100 mg by mouth daily.) 10 capsule 0   etanercept  (ENBREL ) 50 MG/ML injection Inject 50 mg into the skin once a week. Thursdays     folic acid  (FOLVITE )  1 MG tablet Take 1 mg by mouth daily.     HYDROcodone -acetaminophen  (NORCO/VICODIN) 5-325 MG tablet Take 1 tablet by mouth 3 (three) times daily as needed for moderate pain (pain score 4-6) (max 70 tab/month.). 70 tablet 0   HYDROcodone -acetaminophen  (NORCO/VICODIN) 5-325 MG tablet Take 1 tablet by mouth 3 (three) times daily as needed for moderate pain (pain score 4-6) (max 70 tab/month.). 70 tablet 0   HYDROcodone -acetaminophen  (NORCO/VICODIN) 5-325 MG tablet Take 1 tablet by mouth 3 (three) times daily as needed for moderate pain (pain score 4-6) (max 70 tab/month.). 70 tablet 0   irbesartan  (AVAPRO ) 150 MG tablet Take 1 tablet (150 mg total) by mouth daily. 90 tablet 2   levothyroxine  (SYNTHROID ) 50 MCG tablet TAKE ONE TABLET EVERY MORNING ON AN EMPTY STOMACH 90 tablet 3   methotrexate  (RHEUMATREX) 2.5 MG tablet Take 20 mg by mouth every Monday.      nitrofurantoin (MACRODANTIN) 100 MG capsule Take 100 mg by mouth daily. Urologist.     pantoprazole  (PROTONIX ) 20 MG tablet Take 1 tablet (20 mg total) by mouth daily. 30 tablet 3   zoledronic  acid (RECLAST ) 5 MG/100ML SOLN injection Inject 5 mg into the vein once.     amoxicillin  (AMOXIL ) 500 MG tablet Take 4 tablet by mouth   BEFORE DENTAL PROCEDURE. (Patient not taking: Reported on 11/29/2023)     fluticasone  (FLONASE ) 50 MCG/ACT nasal spray Place 1 spray into both nostrils 2 (two) times daily. (Patient not taking: Reported on 11/29/2023) 16 g 6   No current facility-administered medications on file prior to visit.     Allergies  Allergen Reactions   Oxycodone  Other (See Comments)    Makes hallucinates   Sulfamethoxazole Nausea Only       Physical Exam Vitals requested from patient and listed below if patient had equipment and was able to obtain at home for this virtual visit: There were no vitals filed for this visit. Estimated body mass index is 23.86 kg/m as calculated from the following:   Height as of 11/25/23: 5' 4 (1.626 m).   Weight as of 11/25/23: 139 lb (63 kg).  EKG (optional): deferred due to virtual visit  GENERAL: alert, oriented, no acute distress detected, full vision exam deferred due to pandemic and/or virtual encounter  HEENT: atraumatic, conjunttiva clear, no obvious abnormalities on inspection of external nose and ears  NECK: normal movements of the head and neck  LUNGS: on inspection no signs of respiratory distress, breathing rate appears normal, no obvious gross SOB, gasping or wheezing  CV: no obvious cyanosis  MS: moves all visible extremities without noticeable abnormality  PSYCH/NEURO: pleasant and cooperative, no obvious depression or anxiety, speech and thought processing grossly intact, Cognitive function grossly intact  Flowsheet Row Clinical Support from 11/29/2023 in [facility]  PHQ-9 Total Score 0        11/29/2023    2:53 PM 11/25/2023    1:00 PM 08/17/2022    9:13 AM 03/23/2022    2:19 PM 01/22/2022    2:25 PM  Depression screen PHQ 2/9  Decreased Interest 0 0 0 0 0  Down, Depressed, Hopeless 0 0 0 0 0  PHQ - 2 Score 0 0 0 0 0  Altered sleeping 0   0   Tired, decreased energy 0   0   Change in appetite 0   0   Feeling bad or failure about yourself  0   0   Trouble concentrating  0   0   Moving slowly or fidgety/restless 0   0   Suicidal thoughts 0   0   PHQ-9 Score 0   0   Difficult doing work/chores    Not difficult at all        12/29/2021    8:12 PM 01/22/2022    2:28 PM 03/23/2022    2:20 PM  08/17/2022    9:13 AM 11/29/2023    2:51 PM  Fall Risk  Falls in the past year?  1 1 0 0  Was there an injury with Fall?  1 1 0 0  Was there an injury with Fall? - Comments  Fx Rt leg. Followed by Orthopedic     Fall Risk Category Calculator  2 3 0 0  Fall Risk Category (Retired)  Moderate  High     (RETIRED) Patient Fall Risk Level Low fall risk  Moderate fall risk      Patient at Risk for Falls Due to    Other (Comment)   Fall risk Follow up  Falls prevention discussed   Falls evaluation completed Falls evaluation completed     Data saved with a previous flowsheet row definition     SUMMARY AND PLAN:  Encounter for Medicare annual wellness exam   Discussed applicable health maintenance/preventive health measures and advised and referred or ordered per patient preferences: -discussed vaccines due and risks/recs, she plans to get at the pharmacy -reports gets bone density tests with Dr. Ebbie Goldmann Health Maintenance  Topic Date Due   DTaP/Tdap/Td (1 - Tdap) Never done   COVID-19 Vaccine (6 - 2024-25 season) 12/15/2023 (Originally 02/13/2023)   INFLUENZA VACCINE  01/13/2024   Medicare Annual Wellness (AWV)  11/28/2024   DEXA SCAN  Completed   HPV VACCINES  Aged Out   Meningococcal B Vaccine  Aged Out   Pneumococcal Vaccine: 50+ Years  Discontinued   Zoster Vaccines- Shingrix  Discontinued     Education and counseling on the following was provided based on the above review of health and a plan/checklist for the patient, along with additional information discussed, was provided for the patient in the patient instructions :   -Advised and counseled on a healthy lifestyle  -Reviewed patient's current diet. Advised and counseled on a whole foods based healthy diet. A summary of a healthy diet was provided in the Patient Instructions.  -reviewed patient's current physical activity level and discussed exercise guidelines for adults. Discussed community resources and ideas for safe  exercise at home to assist in meeting exercise guideline recommendations in a safe and healthy way. She plans to do some chair exercises until out of the boot and then get back to walking.  -Advise yearly dental visits at minimum and regular eye exams -Advised and counseled on alcohol safe limits, risks Follow up: see patient instructions     Patient Instructions  I really enjoyed getting to talk with you today! I am available on Tuesdays and Thursdays for virtual visits if you have any questions or concerns, or if I can be of any further assistance.   CHECKLIST FROM ANNUAL WELLNESS VISIT:  -Follow up (please call to schedule if not scheduled after visit):   -yearly for annual wellness visit with primary care office  Here is a list of your preventive care/health maintenance measures and the plan for each if any are due:  PLAN For any measures below that may be due:    1. Can get the vaccines at the  pharmacy. If you do, please get a copy of receipt to our office so that we can update your immunization record. Thank you.  Health Maintenance  Topic Date Due   DTaP/Tdap/Td (1 - Tdap) Never done   COVID-19 Vaccine (6 - 2024-25 season) 12/15/2023 (Originally 02/13/2023)   INFLUENZA VACCINE  01/13/2024   Medicare Annual Wellness (AWV)  11/28/2024   DEXA SCAN  Completed   HPV VACCINES  Aged Out   Meningococcal B Vaccine  Aged Out   Pneumococcal Vaccine: 50+ Years  Discontinued   Zoster Vaccines- Shingrix  Discontinued    -See a dentist at least yearly  -Get your eyes checked and then per your eye specialist's recommendations  -Other issues addressed today:   -I have included below further information regarding a healthy whole foods based diet, physical activity guidelines for adults, stress management and opportunities for social connections. I hope you find this information useful.    -----------------------------------------------------------------------------------------------------------------------------------------------------------------------------------------------------------------------------------------------------------    NUTRITION: -eat real food: lots of colorful vegetables (half the plate) and fruits -5-7 servings of vegetables and fruits per day (fresh or steamed is best), exp. 2 servings of vegetables with lunch and dinner and 2 servings of fruit per day. Berries and greens such as kale and collards are great choices.  -consume on a regular basis:  fresh fruits, fresh veggies, fish, nuts, seeds, healthy oils (such as olive oil, avocado oil), whole grains (make sure for bread/pasta/crackers/etc., that the first ingredient on label contains the word whole), legumes. -can eat small amounts of dairy and lean meat (no larger than the palm of your hand), but avoid processed meats such as ham, bacon, lunch meat, etc. -drink water -try to avoid fast food and pre-packaged foods, processed meat, ultra processed foods/beverages (donuts, candy, etc.) -most experts advise limiting sodium to < 2300mg  per day, should limit further is any chronic conditions such as high blood pressure, heart disease, diabetes, etc. The American Heart Association advised that < 1500mg  is is ideal -try to avoid foods/beverages that contain any ingredients with names you do not recognize  -try to avoid foods/beverages  with added sugar or sweeteners/sweets  -try to avoid sweet drinks (including diet drinks): soda, juice, Gatorade, sweet tea, power drinks, diet drinks -try to avoid white rice, white bread, pasta (unless whole grain)  EXERCISE GUIDELINES FOR ADULTS: -if you wish to increase your physical activity, do so gradually and with the approval of your doctor -STOP and seek medical care immediately if you have any chest pain, chest discomfort or trouble breathing when starting or  increasing exercise  -move and stretch your body, legs, feet and arms when sitting for long periods -Physical activity guidelines for optimal health in adults: -get at least 150 minutes per week of moderate exercise (can talk, but not sing); this is about 20-30 minutes of sustained activity 5-7 days per week or two 10-15 minute episodes of sustained activity 5-7 days per week -do some muscle building/resistance training/strength training at least 2 days per week  -balance exercises 3+ days per week:   Stand somewhere where you have something sturdy to hold onto if you lose balance    1) lift up on toes, then back down, start with 5x per day and work up to 20x   2) stand and lift one leg straight out to the side so that foot is a few inches of the floor, start with 5x each side and work up to 20x each side   3) stand on one  foot, start with 5 seconds each side and work up to 20 seconds on each side  If you need ideas or help with getting more active:  -Silver sneakers https://tools.silversneakers.com  -Walk with a Doc: http://www.duncan-williams.com/  -try to include resistance (weight lifting/strength building) and balance exercises twice per week: or the following link for ideas: http://castillo-powell.com/  BuyDucts.dk  STRESS MANAGEMENT: -can try meditating, or just sitting quietly with deep breathing while intentionally relaxing all parts of your body for 5 minutes daily -if you need further help with stress, anxiety or depression please follow up with your primary doctor or contact the wonderful folks at [facility]: [phone]  SOCIAL CONNECTIONS: -options in [place] if you wish to engage in more social and exercise related activities:  -Silver sneakers https://tools.silversneakers.com  -Walk with a Doc: http://www.duncan-williams.com/  -Check out the [facility] Active Adults 50+  section on the [place] of [employer] (hiking clubs, book clubs, cards and games, chess, exercise classes, aquatic classes and much more) - see the website for details: https://www.[place].gov/departments/parks-recreation/active-adults50  -YouTube has lots of exercise videos for different ages and abilities as well  -[facility] (a variety of indoor and outdoor inperson activities for adults). ([phone]) [phone]. [address]  -Virtual Online Classes (a variety of topics): see seniorplanet.org or call 3516690927  -consider volunteering at a school, hospice center, church, senior center or elsewhere            Maurie Southern, DO

## 2023-12-01 DIAGNOSIS — M0589 Other rheumatoid arthritis with rheumatoid factor of multiple sites: Secondary | ICD-10-CM | POA: Diagnosis not present

## 2023-12-12 ENCOUNTER — Other Ambulatory Visit: Payer: Self-pay | Admitting: Family Medicine

## 2023-12-12 DIAGNOSIS — N1831 Chronic kidney disease, stage 3a: Secondary | ICD-10-CM

## 2023-12-12 DIAGNOSIS — I10 Essential (primary) hypertension: Secondary | ICD-10-CM

## 2023-12-15 DIAGNOSIS — D1801 Hemangioma of skin and subcutaneous tissue: Secondary | ICD-10-CM | POA: Diagnosis not present

## 2023-12-15 DIAGNOSIS — M76821 Posterior tibial tendinitis, right leg: Secondary | ICD-10-CM | POA: Diagnosis not present

## 2023-12-15 DIAGNOSIS — L570 Actinic keratosis: Secondary | ICD-10-CM | POA: Diagnosis not present

## 2023-12-15 DIAGNOSIS — L821 Other seborrheic keratosis: Secondary | ICD-10-CM | POA: Diagnosis not present

## 2023-12-15 DIAGNOSIS — L814 Other melanin hyperpigmentation: Secondary | ICD-10-CM | POA: Diagnosis not present

## 2023-12-15 DIAGNOSIS — D225 Melanocytic nevi of trunk: Secondary | ICD-10-CM | POA: Diagnosis not present

## 2023-12-15 DIAGNOSIS — Z85828 Personal history of other malignant neoplasm of skin: Secondary | ICD-10-CM | POA: Diagnosis not present

## 2023-12-15 DIAGNOSIS — L244 Irritant contact dermatitis due to drugs in contact with skin: Secondary | ICD-10-CM | POA: Diagnosis not present

## 2023-12-22 DIAGNOSIS — H5203 Hypermetropia, bilateral: Secondary | ICD-10-CM | POA: Diagnosis not present

## 2023-12-22 DIAGNOSIS — H353131 Nonexudative age-related macular degeneration, bilateral, early dry stage: Secondary | ICD-10-CM | POA: Diagnosis not present

## 2023-12-22 DIAGNOSIS — H40023 Open angle with borderline findings, high risk, bilateral: Secondary | ICD-10-CM | POA: Diagnosis not present

## 2023-12-22 DIAGNOSIS — H04123 Dry eye syndrome of bilateral lacrimal glands: Secondary | ICD-10-CM | POA: Diagnosis not present

## 2023-12-28 DIAGNOSIS — M810 Age-related osteoporosis without current pathological fracture: Secondary | ICD-10-CM | POA: Diagnosis not present

## 2023-12-29 ENCOUNTER — Ambulatory Visit (INDEPENDENT_AMBULATORY_CARE_PROVIDER_SITE_OTHER)

## 2023-12-29 DIAGNOSIS — E538 Deficiency of other specified B group vitamins: Secondary | ICD-10-CM

## 2023-12-29 MED ORDER — CYANOCOBALAMIN 1000 MCG/ML IJ SOLN
1000.0000 ug | Freq: Once | INTRAMUSCULAR | Status: AC
  Administered 2023-12-29: 1000 ug via INTRAMUSCULAR

## 2023-12-29 NOTE — Progress Notes (Signed)
 Patient is in office today for a nurse visit for B12 Injection. Patient Injection was given in the  Left deltoid. Patient tolerated injection well.

## 2024-01-16 DIAGNOSIS — D0439 Carcinoma in situ of skin of other parts of face: Secondary | ICD-10-CM | POA: Diagnosis not present

## 2024-01-16 DIAGNOSIS — D485 Neoplasm of uncertain behavior of skin: Secondary | ICD-10-CM | POA: Diagnosis not present

## 2024-01-19 ENCOUNTER — Other Ambulatory Visit: Payer: Self-pay | Admitting: Family Medicine

## 2024-01-19 DIAGNOSIS — G894 Chronic pain syndrome: Secondary | ICD-10-CM

## 2024-01-19 NOTE — Telephone Encounter (Signed)
 Copied from CRM [id number]. Topic: Clinical - Medication Refill >> Jan 19, 2024  2:54 PM Drema MATSU wrote: Medication: HYDROcodone -acetaminophen  (NORCO/VICODIN) 5-325 MG tablet  Has the patient contacted their pharmacy? Yes (Agent: If no, request that the patient contact the pharmacy for the refill. If patient does not wish to contact the pharmacy document the reason why and proceed with request.) advised to call doctor (Agent: If yes, when and what did the pharmacy advise?)  This is the patient's preferred pharmacy:  [facility] [place], [place] - [address] [place] [address] Phone: [phone] Fax: [phone]  Is this the correct pharmacy for this prescription? Yes If no, delete pharmacy and type the correct one.   Has the prescription been filled recently? Yes  Is the patient out of the medication? Yes  Has the patient been seen for an appointment in the last year OR does the patient have an upcoming appointment? Yes  Can we respond through MyChart? No  Agent: Please be advised that Rx refills may take up to 3 business days. We ask that you follow-up with your pharmacy.

## 2024-01-20 MED ORDER — HYDROCODONE-ACETAMINOPHEN 5-325 MG PO TABS: 1.0000 | ORAL_TABLET | Freq: Three times a day (TID) | ORAL | 0 refills | Status: DC | PRN

## 2024-01-30 ENCOUNTER — Ambulatory Visit (INDEPENDENT_AMBULATORY_CARE_PROVIDER_SITE_OTHER)

## 2024-01-30 DIAGNOSIS — E538 Deficiency of other specified B group vitamins: Secondary | ICD-10-CM

## 2024-01-30 MED ORDER — CYANOCOBALAMIN 1000 MCG/ML IJ SOLN
1000.0000 ug | Freq: Once | INTRAMUSCULAR | Status: AC
  Administered 2024-01-30: 1000 ug via INTRAMUSCULAR

## 2024-01-30 NOTE — Progress Notes (Signed)
 Patient is in office today for a nurse visit for B12 Injection. Patient Injection was given in the  Right deltoid. Patient tolerated injection well.

## 2024-02-07 DIAGNOSIS — R3121 Asymptomatic microscopic hematuria: Secondary | ICD-10-CM | POA: Diagnosis not present

## 2024-02-23 ENCOUNTER — Other Ambulatory Visit: Payer: Self-pay | Admitting: Family Medicine

## 2024-02-23 DIAGNOSIS — G894 Chronic pain syndrome: Secondary | ICD-10-CM

## 2024-02-23 NOTE — Telephone Encounter (Unsigned)
 Copied from CRM [id number]. Topic: Clinical - Medication Refill >> Feb 23, 2024 10:07 AM Zy'onna H wrote: Medication: HYDROcodone -acetaminophen  (NORCO/VICODIN) 5-325 MG tablet  Has the patient contacted their pharmacy? Yes (Agent: If no, request that the patient contact the pharmacy for the refill. If patient does not wish to contact the pharmacy document the reason why and proceed with request.) (Agent: If yes, when and what did the pharmacy advise?)  This is the patient's preferred pharmacy:  [facility] [place], [place] - [address] [place] [address] Phone: [phone] Fax: [phone]  Is this the correct pharmacy for this prescription? Yes If no, delete pharmacy and type the correct one.   Has the prescription been filled recently? Yes  Is the patient out of the medication? Yes  Has the patient been seen for an appointment in the last year OR does the patient have an upcoming appointment? Yes  Can we respond through MyChart? No  Agent: Please be advised that Rx refills may take up to 3 business days. We ask that you follow-up with your pharmacy.

## 2024-02-24 MED ORDER — HYDROCODONE-ACETAMINOPHEN 5-325 MG PO TABS: 1.0000 | ORAL_TABLET | Freq: Three times a day (TID) | ORAL | 0 refills | Status: DC | PRN

## 2024-03-19 ENCOUNTER — Telehealth: Payer: Self-pay

## 2024-03-19 NOTE — Telephone Encounter (Signed)
 Spoke with patient. Reviewed chart. Appointment made for next Wednesday, March 28, 2024 at 1500. Patient voiced understanding.     Copied from CRM #[id number]. Topic: Clinical - Request for Lab/Test Order >> Mar 19, 2024 10:38 AM Suzen RAMAN wrote: Reason for CRM: patient would like order placed for B12 injection. Please contact patient to schedule injection once orders are placed patient would prefer Wednesday afternoon. If unable to reach patient please leave a detailed message per patient.     CB# [phone]

## 2024-03-26 DIAGNOSIS — L308 Other specified dermatitis: Secondary | ICD-10-CM | POA: Diagnosis not present

## 2024-03-26 DIAGNOSIS — L821 Other seborrheic keratosis: Secondary | ICD-10-CM | POA: Diagnosis not present

## 2024-03-26 DIAGNOSIS — L2989 Other pruritus: Secondary | ICD-10-CM | POA: Diagnosis not present

## 2024-03-26 DIAGNOSIS — Z85828 Personal history of other malignant neoplasm of skin: Secondary | ICD-10-CM | POA: Diagnosis not present

## 2024-03-26 DIAGNOSIS — L570 Actinic keratosis: Secondary | ICD-10-CM | POA: Diagnosis not present

## 2024-03-28 ENCOUNTER — Ambulatory Visit

## 2024-03-29 ENCOUNTER — Ambulatory Visit

## 2024-03-29 DIAGNOSIS — E538 Deficiency of other specified B group vitamins: Secondary | ICD-10-CM | POA: Diagnosis not present

## 2024-03-29 MED ORDER — CYANOCOBALAMIN 1000 MCG/ML IJ SOLN
1000.0000 ug | Freq: Once | INTRAMUSCULAR | Status: AC
  Administered 2024-03-29: 1000 ug via INTRAMUSCULAR

## 2024-03-29 NOTE — Progress Notes (Signed)
 Patient is in office today for a nurse visit for B12 Injection. Patient Injection was given in the  Left deltoid. Patient tolerated injection well.

## 2024-04-09 DIAGNOSIS — M25512 Pain in left shoulder: Secondary | ICD-10-CM | POA: Diagnosis not present

## 2024-04-09 DIAGNOSIS — M810 Age-related osteoporosis without current pathological fracture: Secondary | ICD-10-CM | POA: Diagnosis not present

## 2024-04-09 DIAGNOSIS — M1991 Primary osteoarthritis, unspecified site: Secondary | ICD-10-CM | POA: Diagnosis not present

## 2024-04-09 DIAGNOSIS — Z79899 Other long term (current) drug therapy: Secondary | ICD-10-CM | POA: Diagnosis not present

## 2024-04-09 DIAGNOSIS — Z6822 Body mass index (BMI) 22.0-22.9, adult: Secondary | ICD-10-CM | POA: Diagnosis not present

## 2024-04-09 DIAGNOSIS — M0589 Other rheumatoid arthritis with rheumatoid factor of multiple sites: Secondary | ICD-10-CM | POA: Diagnosis not present

## 2024-04-09 DIAGNOSIS — M47812 Spondylosis without myelopathy or radiculopathy, cervical region: Secondary | ICD-10-CM | POA: Diagnosis not present

## 2024-04-09 DIAGNOSIS — S86012D Strain of left Achilles tendon, subsequent encounter: Secondary | ICD-10-CM | POA: Diagnosis not present

## 2024-04-09 DIAGNOSIS — M722 Plantar fascial fibromatosis: Secondary | ICD-10-CM | POA: Diagnosis not present

## 2024-04-09 DIAGNOSIS — M6281 Muscle weakness (generalized): Secondary | ICD-10-CM | POA: Diagnosis not present

## 2024-04-09 DIAGNOSIS — G894 Chronic pain syndrome: Secondary | ICD-10-CM | POA: Diagnosis not present

## 2024-05-01 ENCOUNTER — Ambulatory Visit (INDEPENDENT_AMBULATORY_CARE_PROVIDER_SITE_OTHER)

## 2024-05-01 DIAGNOSIS — E538 Deficiency of other specified B group vitamins: Secondary | ICD-10-CM

## 2024-05-01 MED ORDER — CYANOCOBALAMIN 1000 MCG/ML IJ SOLN
1000.0000 ug | Freq: Once | INTRAMUSCULAR | Status: AC
  Administered 2024-05-01: 1000 ug via INTRAMUSCULAR

## 2024-05-01 NOTE — Progress Notes (Signed)
 Patient is in office today for a nurse visit for B12 Injection. Patient Injection was given in the  Left deltoid. Patient tolerated injection well.

## 2024-05-03 ENCOUNTER — Other Ambulatory Visit: Payer: Self-pay | Admitting: Family Medicine

## 2024-05-03 DIAGNOSIS — G894 Chronic pain syndrome: Secondary | ICD-10-CM

## 2024-05-03 NOTE — Telephone Encounter (Signed)
 Copied from CRM [id number]. Topic: Clinical - Medication Refill >> May 03, 2024  8:17 AM Kendralyn S wrote: Medication: HYDROcodone -acetaminophen  (NORCO/VICODIN) 5-325 MG tablet  Has the patient contacted their pharmacy? Yes (Agent: If no, request that the patient contact the pharmacy for the refill. If patient does not wish to contact the pharmacy document the reason why and proceed with request.) (Agent: If yes, when and what did the pharmacy advise?)  This is the patient's preferred pharmacy:  [facility] [place], [place] - [address] [place] [address] Phone: [phone] Fax: [phone]  Is this the correct pharmacy for this prescription? Yes If no, delete pharmacy and type the correct one.   Has the prescription been filled recently? No  Is the patient out of the medication? No  Has the patient been seen for an appointment in the last year OR does the patient have an upcoming appointment? Yes  Can we respond through MyChart? No  Agent: Please be advised that Rx refills may take up to 3 business days. We ask that you follow-up with your pharmacy.

## 2024-05-04 NOTE — Telephone Encounter (Signed)
 According to PDMP she filled the prescription yesterday. Thanks, BJ

## 2024-06-04 ENCOUNTER — Telehealth: Payer: Self-pay

## 2024-06-04 ENCOUNTER — Ambulatory Visit

## 2024-06-04 DIAGNOSIS — E538 Deficiency of other specified B group vitamins: Secondary | ICD-10-CM

## 2024-06-04 MED ORDER — CYANOCOBALAMIN 1000 MCG/ML IJ SOLN
1000.0000 ug | Freq: Once | INTRAMUSCULAR | Status: AC
  Administered 2024-06-04: 1000 ug via INTRAMUSCULAR

## 2024-06-04 NOTE — Progress Notes (Signed)
 Patient is in office today for a nurse visit for B12 Injection. Patient Injection was given in the  Left deltoid. Patient tolerated injection well.

## 2024-06-04 NOTE — Telephone Encounter (Signed)
 Noted. Has an appt tomorrow. BJ

## 2024-06-04 NOTE — Telephone Encounter (Signed)
 Copied from CRM #[id number]. Topic: Clinical - Red Word Triage >> Jun 04, 2024  1:27 PM Olam RAMAN wrote: Red Word that prompted transfer to Nurse Triage: pain on both sides of stomach Refused nt

## 2024-06-05 ENCOUNTER — Ambulatory Visit (INDEPENDENT_AMBULATORY_CARE_PROVIDER_SITE_OTHER): Admitting: Family Medicine

## 2024-06-05 ENCOUNTER — Encounter: Payer: Self-pay | Admitting: Family Medicine

## 2024-06-05 VITALS — BP 134/80 | HR 68 | Temp 97.9°F | Resp 16 | Ht 64.0 in | Wt 123.2 lb

## 2024-06-05 DIAGNOSIS — R1031 Right lower quadrant pain: Secondary | ICD-10-CM | POA: Diagnosis not present

## 2024-06-05 LAB — CBC
HCT: 32.2 % — ABNORMAL LOW (ref 36.0–46.0)
Hemoglobin: 10.5 g/dL — ABNORMAL LOW (ref 12.0–15.0)
MCHC: 32.6 g/dL (ref 30.0–36.0)
MCV: 89.2 fl (ref 78.0–100.0)
Platelets: 249 K/uL (ref 150.0–400.0)
RBC: 3.61 Mil/uL — ABNORMAL LOW (ref 3.87–5.11)
RDW: 19.7 % — ABNORMAL HIGH (ref 11.5–15.5)
WBC: 6 K/uL (ref 4.0–10.5)

## 2024-06-05 LAB — COMPREHENSIVE METABOLIC PANEL WITH GFR
ALT: 13 U/L (ref 3–35)
AST: 23 U/L (ref 5–37)
Albumin: 4.1 g/dL (ref 3.5–5.2)
Alkaline Phosphatase: 68 U/L (ref 39–117)
BUN: 21 mg/dL (ref 6–23)
CO2: 24 meq/L (ref 19–32)
Calcium: 9.6 mg/dL (ref 8.4–10.5)
Chloride: 105 meq/L (ref 96–112)
Creatinine, Ser: 1.25 mg/dL — ABNORMAL HIGH (ref 0.40–1.20)
GFR: 38.84 mL/min — ABNORMAL LOW
Glucose, Bld: 89 mg/dL (ref 70–99)
Potassium: 4.6 meq/L (ref 3.5–5.1)
Sodium: 139 meq/L (ref 135–145)
Total Bilirubin: 0.8 mg/dL (ref 0.2–1.2)
Total Protein: 6.4 g/dL (ref 6.0–8.3)

## 2024-06-05 NOTE — Progress Notes (Signed)
 "  ACUTE VISIT Chief Complaint  Patient presents with   Acute Visit    Pain on both sides of abdomen    Discussed the use of AI scribe software for clinical note transcription with the patient, who gave verbal consent to proceed.  History of Present Illness Jennifer Goodman is an 89 year old female with a PMHx significant for paroxysmal atrial fibrillation, HTN, hypothyroidism, OA, RA, CKD III, B12 deficiency, and chronic pain disorder who presents with abdominal pain.  She has been experiencing sporadic abdominal pain for the past three to four months, located in the lower abdomen bilaterally, described as sore. The pain is intermittent, not occurring daily, and is exacerbated by movement such as turning over in bed or leaning over in a chair. She rates the pain as 5-6/10 at its worst, with occasional sharp episodes that resolve quickly. The frequency of the pain has increased over time. She has not identifies alleviating factors.  She denies any changes in bowel habits, maintaining regular daily bowel movements with the aid of Colace, taking two capsules nightly. There is no association of the pain with bowel movements.  Negative for blood in stool or melena. No recent changes in urinary frequency, dysuria,or gross hematuria. No change in pain with eating.  No numbness, tingling of affected area, vaginal discharge, or bleeding.  She has experienced a weight loss of approximately thirteen pounds since June of the previous year, attributed to decreased appetite and reduced food intake following a period of immobility due to a leg injury.  No fever ,nausea,or vomiting.  Since her last visit, 11/25/23, she has seen her dermatologist, underwent bx of skin lesions on her face, which showed skin cancer, not sure what type. She is scheduled for further treatment.   Hx of chronic anemia, last H/H 04/09/24 was 9.4/29.8.  Review of Systems  Constitutional:  Negative for activity change and chills.   HENT:  Negative for sore throat and trouble swallowing.   Respiratory:  Negative for cough, shortness of breath and wheezing.   Cardiovascular:  Negative for chest pain.  Endocrine: Negative for cold intolerance and heat intolerance.  Genitourinary:  Negative for flank pain.  Musculoskeletal:  Positive for arthralgias and back pain.  Skin:  Negative for rash.  Neurological:  Negative for syncope, weakness and headaches.  Psychiatric/Behavioral:  Negative for confusion and hallucinations.   See other pertinent positives and negatives in HPI.  Medications Ordered Prior to Encounter[1]  Past Medical History:  Diagnosis Date   Anemia    Arthritis    Rhuematoid-takes Enbrel  and Methorexate    Back pain    states in Jan 2017 spinal was attempted 4 times and has had back pain since.   Cancer (HCC)    skin cancer on head   Chicken pox    Diverticulosis    History of colon polyps    benign   Hypertension    takes Avapro  daily   Hypothyroidism    takes Synthroid  daily   Joint pain    Pneumonia    at age 89   PONV (postoperative nausea and vomiting)    the patch helps!   Urinary frequency    Urinary urgency    was taking Myrbetriq  but stopped.Plans to start back   Allergies[2]  Social History   Socioeconomic History   Marital status: Married    Spouse name: Not on file   Number of children: 2   Years of education: Not on file  Highest education level: Not on file  Occupational History   Occupation: Retired  Tobacco Use   Smoking status: Former    Current packs/day: 0.00    Types: Cigarettes    Quit date: 06/14/1978    Years since quitting: 46.0   Smokeless tobacco: Never   Tobacco comments:    quit 32 yrs ago  Vaping Use   Vaping status: Never Used  Substance and Sexual Activity   Alcohol use: Yes    Comment: occ   Drug use: No   Sexual activity: Never  Other Topics Concern   Not on file  Social History Narrative   She lives in [place] with her husband.    Social Drivers of Health   Tobacco Use: Medium Risk (06/05/2024)   Patient History    Smoking Tobacco Use: Former    Smokeless Tobacco Use: Never    Passive Exposure: Not on file  Financial Resource Strain: Low Risk (01/22/2022)   Overall Financial Resource Strain (CARDIA)    Difficulty of Paying Living Expenses: Not hard at all  Food Insecurity: No Food Insecurity (01/22/2022)   Hunger Vital Sign    Worried About Running Out of Food in the Last Year: Never true    Ran Out of Food in the Last Year: Never true  Transportation Needs: No Transportation Needs (01/22/2022)   PRAPARE - Administrator, Civil Service (Medical): No    Lack of Transportation (Non-Medical): No  Physical Activity: Sufficiently Active (01/22/2022)   Exercise Vital Sign    Days of Exercise per Week: 5 days    Minutes of Exercise per Session: 30 min  Stress: No Stress Concern Present (01/22/2022)   [employer] of Occupational Health - Occupational Stress Questionnaire    Feeling of Stress : Not at all  Social Connections: Socially Integrated (01/22/2022)   Social Connection and Isolation Panel    Frequency of Communication with Friends and Family: More than three times a week    Frequency of Social Gatherings with Friends and Family: More than three times a week    Attends Religious Services: More than 4 times per year    Active Member of Clubs or Organizations: Yes    Attends Banker Meetings: More than 4 times per year    Marital Status: Married  Depression (PHQ2-9): Low Risk (11/29/2023)   Depression (PHQ2-9)    PHQ-2 Score: 0  Alcohol Screen: Low Risk (01/22/2022)   Alcohol Screen    Last Alcohol Screening Score (AUDIT): 2  Housing: Low Risk (01/22/2022)   Housing    Last Housing Risk Score: 0  Utilities: Not on file  Health Literacy: Not on file   Vitals:   06/05/24 1404  BP: 134/80  Pulse: 68  Resp: 16  Temp: 97.9 F (36.6 C)  SpO2: 98%   Wt Readings from Last 3  Encounters:  06/05/24 123 lb 3.2 oz (55.9 kg)  11/25/23 139 lb (63 kg)  06/27/23 138 lb 8 oz (62.8 kg)   Body mass index is 21.15 kg/m.  Physical Exam Vitals and nursing note reviewed.  Constitutional:      General: She is not in acute distress.    Appearance: She is well-developed.  HENT:     Head: Normocephalic and atraumatic.     Mouth/Throat:     Mouth: Mucous membranes are moist.     Pharynx: Oropharynx is clear.  Eyes:     Conjunctiva/sclera: Conjunctivae normal.  Cardiovascular:  Rate and Rhythm: Normal rate and regular rhythm.     Heart sounds: No murmur heard. Pulmonary:     Effort: Pulmonary effort is normal. No respiratory distress.     Breath sounds: Normal breath sounds.  Abdominal:     Palpations: Abdomen is soft. There is no hepatomegaly or mass.     Tenderness: There is abdominal tenderness (Mild, some discomfort LLQ) in the right lower quadrant.  Lymphadenopathy:     Cervical: No cervical adenopathy.  Skin:    General: Skin is warm.     Findings: No erythema or rash.  Neurological:     General: No focal deficit present.     Mental Status: She is alert and oriented to person, place, and time.     Gait: Gait normal.     Comments: Unstable gait assisted with a cane.  Psychiatric:        Mood and Affect: Mood and affect normal.    ASSESSMENT AND PLAN:  Jennifer Goodman was seen today for acute visit.  Diagnoses and all orders for this visit: Orders Placed This Encounter  Procedures   CT ABDOMEN PELVIS W CONTRAST   Comprehensive metabolic panel with GFR   CBC   Lab Results  Component Value Date   NA 139 06/05/2024   CL 105 06/05/2024   K 4.6 06/05/2024   CO2 24 06/05/2024   BUN 21 06/05/2024   CREATININE 1.25 (H) 06/05/2024   GFR 38.84 (L) 06/05/2024   CALCIUM  9.6 06/05/2024   ALBUMIN 4.1 06/05/2024   GLUCOSE 89 06/05/2024   Lab Results  Component Value Date   ALT 13 06/05/2024   AST 23 06/05/2024   ALKPHOS 68 06/05/2024   BILITOT  0.8 06/05/2024   Lab Results  Component Value Date   WBC 6.0 06/05/2024   HGB 10.5 (L) 06/05/2024   HCT 32.2 (L) 06/05/2024   MCV 89.2 06/05/2024   PLT 249.0 06/05/2024   Right lower quadrant abdominal pain Bilateral lower abdominal pain, RLQ>LLQ. We discussed possible etiologies, including radicular and musculoskeletal. Because age and new abdominal pain + some wt loss since 11/2023, recommend abdominal imaging. Last e GFR at her rheumatologist's office was 50. Abdomen/pelvic CR w contrast ordered. We discussed possible effects of contrast in regard to renal function. Clearly instructed about warning signs.  -     Comprehensive metabolic panel with GFR; Future -     CBC; Future -     CT ABDOMEN PELVIS W CONTRAST; Future -     CBC -     Comprehensive metabolic panel with GFR  I personally spent a total of 34 minutes in the care of the patient today including preparing to see the patient, getting/reviewing separately obtained history, performing a medically appropriate exam/evaluation, counseling and educating, placing orders, and documenting clinical information in the EHR.  Return if symptoms worsen or fail to improve, for keep next appointment.  Elye Harmsen G. Khiley Lieser, MD  [facility]. [place] office.     [1]  Current Outpatient Medications on File Prior to Visit  Medication Sig Dispense Refill   Cholecalciferol (VITAMIN D3) 5000 UNITS TABS Take 5,000 tablets by mouth daily.     cyanocobalamin  (,VITAMIN B-12,) 1000 MCG/ML injection INJECT 1 ML EVERY 3 WEEKS. (Patient taking differently: Inject 1,000 mcg into the muscle. Every 4 weeks.) 4 mL 0   docusate sodium  (COLACE) 100 MG capsule Take 1 capsule (100 mg total) by mouth 2 (two) times daily. (Patient taking differently: Take 100  mg by mouth daily.) 10 capsule 0   etanercept  (ENBREL ) 50 MG/ML injection Inject 50 mg into the skin once a week. Thursdays     folic acid  (FOLVITE ) 1 MG tablet Take 1 mg by mouth daily.      HYDROcodone -acetaminophen  (NORCO/VICODIN) 5-325 MG tablet Take 1 tablet by mouth 3 (three) times daily as needed for moderate pain (pain score 4-6) (max 70 tab/month.). 70 tablet 0   HYDROcodone -acetaminophen  (NORCO/VICODIN) 5-325 MG tablet Take 1 tablet by mouth 3 (three) times daily as needed for moderate pain (pain score 4-6) (max 70 tab/month.). 70 tablet 0   HYDROcodone -acetaminophen  (NORCO/VICODIN) 5-325 MG tablet Take 1 tablet by mouth 3 (three) times daily as needed for moderate pain (pain score 4-6) (max 70 tab/month.). 70 tablet 0   irbesartan  (AVAPRO ) 150 MG tablet Take 1 tablet (150 mg total) by mouth daily. 90 tablet 2   levothyroxine  (SYNTHROID ) 50 MCG tablet TAKE ONE TABLET EVERY MORNING ON AN EMPTY STOMACH 90 tablet 3   methotrexate  (RHEUMATREX) 2.5 MG tablet Take 20 mg by mouth every Monday.      nitrofurantoin (MACRODANTIN) 100 MG capsule Take 100 mg by mouth daily. Urologist.     pantoprazole  (PROTONIX ) 20 MG tablet Take 1 tablet (20 mg total) by mouth daily. 30 tablet 3   zoledronic  acid (RECLAST ) 5 MG/100ML SOLN injection Inject 5 mg into the vein once.     amoxicillin  (AMOXIL ) 500 MG tablet Take 4 tablet by mouth   BEFORE DENTAL PROCEDURE. (Patient not taking: Reported on 06/05/2024)     fluticasone  (FLONASE ) 50 MCG/ACT nasal spray Place 1 spray into both nostrils 2 (two) times daily. (Patient not taking: Reported on 06/05/2024) 16 g 6   No current facility-administered medications on file prior to visit.  [2]  Allergies Allergen Reactions   Oxycodone  Other (See Comments)    Makes hallucinates   Sulfamethoxazole Nausea Only   "

## 2024-06-05 NOTE — Patient Instructions (Addendum)
 A few things to remember from today's visit:  Right lower quadrant abdominal pain - Plan: Comprehensive metabolic panel with GFR, CBC, CT ABDOMEN PELVIS W CONTRAST Pain could be musculoskeletal. Because it is a new problem abdominal imaging will be arranged. Monitor for new symptoms.  If you need refills for medications you take chronically, please call your pharmacy. Do not use My Chart to request refills or for acute issues that need immediate attention. If you send a my chart message, it may take a few days to be addressed, specially if I am not in the office.  Please be sure medication list is accurate. If a new problem present, please set up appointment sooner than planned today.

## 2024-06-08 ENCOUNTER — Ambulatory Visit: Payer: Self-pay | Admitting: Family Medicine

## 2024-06-11 ENCOUNTER — Other Ambulatory Visit: Payer: Self-pay | Admitting: Family Medicine

## 2024-06-11 DIAGNOSIS — G894 Chronic pain syndrome: Secondary | ICD-10-CM

## 2024-06-11 NOTE — Telephone Encounter (Unsigned)
 Copied from CRM [id number]. Topic: Clinical - Medication Refill >> Jun 11, 2024 10:41 AM Viola F wrote: Medication: HYDROcodone -acetaminophen  (NORCO/VICODIN) 5-325 MG tablet [[id number]]  Has the patient contacted their pharmacy? Yes (Agent: If no, request that the patient contact the pharmacy for the refill. If patient does not wish to contact the pharmacy document the reason why and proceed with request.) (Agent: If yes, when and what did the pharmacy advise?)  This is the patient's preferred pharmacy:  [facility] [place], [place] - [address] [place] [place] [address] Phone: [phone] Fax: [phone]  Is this the correct pharmacy for this prescription? Yes If no, delete pharmacy and type the correct one.   Has the prescription been filled recently? Yes  Is the patient out of the medication? No, 2 days left   Has the patient been seen for an appointment in the last year OR does the patient have an upcoming appointment? Yes  Can we respond through MyChart? No  Agent: Please be advised that Rx refills may take up to 3 business days. We ask that you follow-up with your pharmacy.

## 2024-06-11 NOTE — Telephone Encounter (Signed)
 Patient informed of her labs results. Patient voiced understanding. Patient did not have other questions or concerns. Patient stated FYI she is having a CT scan today 06/11/24

## 2024-06-12 MED ORDER — HYDROCODONE-ACETAMINOPHEN 5-325 MG PO TABS: 1.0000 | ORAL_TABLET | Freq: Three times a day (TID) | ORAL | 0 refills | Status: DC | PRN

## 2024-06-13 ENCOUNTER — Ambulatory Visit (HOSPITAL_BASED_OUTPATIENT_CLINIC_OR_DEPARTMENT_OTHER)
Admission: RE | Admit: 2024-06-13 | Discharge: 2024-06-13 | Disposition: A | Discharge status: Home / Self Care | Source: Ambulatory Visit | Attending: Family Medicine | Admitting: Family Medicine

## 2024-06-13 DIAGNOSIS — R1031 Right lower quadrant pain: Secondary | ICD-10-CM | POA: Diagnosis present

## 2024-06-13 MED ORDER — IOHEXOL 300 MG/ML  SOLN
100.0000 mL | Freq: Once | INTRAMUSCULAR | Status: AC | PRN
  Administered 2024-06-13: 80 mL via INTRAVENOUS

## 2024-06-22 NOTE — Telephone Encounter (Signed)
 Patient informed of her lab results. Patient voiced understanding and did not have any questions or concerns at this time.

## 2024-07-11 ENCOUNTER — Encounter: Payer: Self-pay | Admitting: Family Medicine

## 2024-07-11 ENCOUNTER — Ambulatory Visit: Admitting: Family Medicine

## 2024-07-11 ENCOUNTER — Other Ambulatory Visit: Payer: Self-pay | Admitting: Family Medicine

## 2024-07-11 VITALS — BP 124/78 | HR 60 | Temp 97.7°F | Resp 16 | Ht 60.0 in | Wt 121.0 lb

## 2024-07-11 DIAGNOSIS — M159 Polyosteoarthritis, unspecified: Secondary | ICD-10-CM | POA: Diagnosis not present

## 2024-07-11 DIAGNOSIS — I480 Paroxysmal atrial fibrillation: Secondary | ICD-10-CM

## 2024-07-11 DIAGNOSIS — I10 Essential (primary) hypertension: Secondary | ICD-10-CM

## 2024-07-11 DIAGNOSIS — G894 Chronic pain syndrome: Secondary | ICD-10-CM

## 2024-07-11 DIAGNOSIS — N1831 Chronic kidney disease, stage 3a: Secondary | ICD-10-CM | POA: Diagnosis not present

## 2024-07-11 NOTE — Telephone Encounter (Signed)
 Rx sent. Due for follow up on pain management. Thanks, BJ

## 2024-07-11 NOTE — Patient Instructions (Addendum)
 A few things to remember from today's visit:  Chronic pain disorder  Osteoarthritis of multiple joints, unspecified osteoarthritis type  Paroxysmal atrial fibrillation (HCC)  Stage 3a chronic kidney disease (HCC)  No changes today. Continue adequate hydration.  If you need refills for medications you take chronically, please call your pharmacy. Do not use My Chart to request refills or for acute issues that need immediate attention. If you send a my chart message, it may take a few days to be addressed, specially if I am not in the office.  Please be sure medication list is accurate. If a new problem present, please set up appointment sooner than planned today.

## 2024-07-11 NOTE — Assessment & Plan Note (Signed)
 Rhythm and rate controlled. She is not on chronic anticoagulation and no longer following with cardiologist.

## 2024-07-11 NOTE — Assessment & Plan Note (Addendum)
 Problem is adequately controlled with current regimen. Currently on hydrocodone -acetaminophen  5-325 mg 70 tablets/month, she usually takes it twice daily and an extra one if needed. Well tolerated. She cannot take NSAIDs, hx of CKD and HTN. PDMP reviewed. Med contract signed today. Follow up in 5-6 months.

## 2024-07-11 NOTE — Assessment & Plan Note (Signed)
 BP adequately controlled. Continue irbesartan  150 mg daily and low-salt diet.

## 2024-07-11 NOTE — Telephone Encounter (Signed)
 Appointment sch for 07/11/24

## 2024-07-11 NOTE — Progress Notes (Unsigned)
 "   Chief Complaint  Patient presents with   Pain Management   Discussed the use of AI scribe software for clinical note transcription with the patient, who gave verbal consent to proceed. History of Present Illness Jennifer Goodman is an 89 year old female with a PMHx significant for paroxysmal atrial fibrillation, HTN, hypothyroidism, OA, osteoporosis, CKD III, B12 deficiency, and chronic pain disorder, who is here today for chronic disease management.  Last seen on 06/05/24 for abdominal pain.  She experiences sporadic right-sided abdominal pain, exacerbated by sitting and movement.  Abdominal/pelvic CT: 06/13/25 1. No acute findings in the abdomen or pelvis. 2. Severe atherosclerotic plaque The pain does not interfere with her sleep or with daily activities.  Chronic pain: OA and RA. She takes hydrocodone  5/325 mg  tid prn, as prescribed, which is the only medication that alleviates her pain. She does not use any over-the-counter pain medications. Rheumatoid arthritis on methotrexate  and Enbrel . She follows with rheumatologist, Dr. Mai, regularly.  Pain is moderate to severe without medication, limiting activities of daily living and with medication pain is mild.  She is experiencing difficulty managing her husband's care at home due to his hx of dementia and her own physical limitations, particularly with her right shoulder, which has become sore from assisting him. She uses a cane to help with transfer.  She has no issues with constipation and takes Colace daily.   She mentions that she is scheduled to see an oral surgeon on February 4th for sinus issues related to a previously extracted tooth that never healed properly, causing drainage and affecting her hearing. She has seen ENT for this problem in the past.  Hypertension: Currently on Avapro  150 mg daily.  She has a history of paroxysmal atrial fibrillation, not on anticoagulation. She has not seen a cardiologist in years  She occasionally experiences palpitations, which have mostly subsided. Negative for unusual or severe headache, visual changes, exertional chest pain, dyspnea,  focal weakness, or edema. Cr 1.08 and e GFR 50 in 03/2024 at her rheumatologist's office.  She reports that she may not be drinking enough fluids because exacerbates her urine frequency and incontinence.  Lab Results  Component Value Date   NA 139 06/05/2024   CL 105 06/05/2024   K 4.6 06/05/2024   CO2 24 06/05/2024   BUN 21 06/05/2024   CREATININE 1.25 (H) 06/05/2024   GFR 38.84 (L) 06/05/2024   CALCIUM  9.6 06/05/2024   ALBUMIN 4.1 06/05/2024   GLUCOSE 89 06/05/2024   Negative for gross hematuria, foamy urine, or decreased urine output.  Review of Systems  Constitutional:  Negative for activity change, appetite change, chills and fever.  HENT:  Negative for mouth sores and sore throat.   Respiratory:  Negative for cough and wheezing.   Gastrointestinal:  Negative for abdominal pain, nausea and vomiting.  Musculoskeletal:  Positive for arthralgias and gait problem.  Skin:  Negative for rash.  Neurological:  Negative for syncope and facial asymmetry.  Psychiatric/Behavioral:  Negative for confusion and hallucinations.   See other pertinent positives and negatives in HPI.  Current Outpatient Medications on File Prior to Visit  Medication Sig Dispense Refill   Cholecalciferol (VITAMIN D3) 5000 UNITS TABS Take 5,000 tablets by mouth daily.     cyanocobalamin  (,VITAMIN B-12,) 1000 MCG/ML injection INJECT 1 ML EVERY 3 WEEKS. (Patient taking differently: Inject 1,000 mcg into the muscle. Every 4 weeks.) 4 mL 0   docusate sodium  (COLACE) 100 MG capsule Take 1  capsule (100 mg total) by mouth 2 (two) times daily. (Patient taking differently: Take 100 mg by mouth daily.) 10 capsule 0   etanercept  (ENBREL ) 50 MG/ML injection Inject 50 mg into the skin once a week. Thursdays     folic acid  (FOLVITE ) 1 MG tablet Take 1 mg by mouth  daily.     HYDROcodone -acetaminophen  (NORCO/VICODIN) 5-325 MG tablet Take 1 tablet by mouth 3 (three) times daily as needed for moderate pain (pain score 4-6) (max 70 tab/month.). 70 tablet 0   HYDROcodone -acetaminophen  (NORCO/VICODIN) 5-325 MG tablet Take 1 tablet by mouth 3 (three) times daily as needed for moderate pain (pain score 4-6) (max 70 tab/month.). 70 tablet 0   HYDROcodone -acetaminophen  (NORCO/VICODIN) 5-325 MG tablet Take 1 tablet by mouth 3 (three) times daily as needed for moderate pain (pain score 4-6) (max 70 tab/month.). 70 tablet 0   irbesartan  (AVAPRO ) 150 MG tablet Take 1 tablet (150 mg total) by mouth daily. 90 tablet 2   levothyroxine  (SYNTHROID ) 50 MCG tablet TAKE ONE TABLET EVERY MORNING ON AN EMPTY STOMACH 90 tablet 3   methotrexate  (RHEUMATREX) 2.5 MG tablet Take 20 mg by mouth every Monday.      nitrofurantoin (MACRODANTIN) 100 MG capsule Take 100 mg by mouth daily. Urologist.     fluticasone  (FLONASE ) 50 MCG/ACT nasal spray Place 1 spray into both nostrils 2 (two) times daily. (Patient not taking: Reported on 07/11/2024) 16 g 6   No current facility-administered medications on file prior to visit.    Past Medical History:  Diagnosis Date   Anemia    Arthritis    Rhuematoid-takes Enbrel  and Methorexate    Back pain    states in Jan 2017 spinal was attempted 4 times and has had back pain since.   Cancer (HCC)    skin cancer on head   Chicken pox    Diverticulosis    History of colon polyps    benign   Hypertension    takes Avapro  daily   Hypothyroidism    takes Synthroid  daily   Joint pain    Pneumonia    at age 62   PONV (postoperative nausea and vomiting)    the patch helps!   Urinary frequency    Urinary urgency    was taking Myrbetriq  but stopped.Plans to start back   Allergies  Allergen Reactions   Oxycodone  Other (See Comments)    Makes hallucinates   Sulfamethoxazole Nausea Only    Social History   Socioeconomic History   Marital  status: Married    Spouse name: Not on file   Number of children: 2   Years of education: Not on file   Highest education level: Not on file  Occupational History   Occupation: Retired  Tobacco Use   Smoking status: Former    Current packs/day: 0.00    Types: Cigarettes    Quit date: 06/14/1978    Years since quitting: 46.1   Smokeless tobacco: Never   Tobacco comments:    quit 32 yrs ago  Vaping Use   Vaping status: Never Used  Substance and Sexual Activity   Alcohol use: Yes    Comment: occ   Drug use: No   Sexual activity: Never  Other Topics Concern   Not on file  Social History Narrative   She lives in [place] with her husband.   Social Drivers of Health   Tobacco Use: Medium Risk (07/11/2024)   Patient History    Smoking Tobacco Use: Former  Smokeless Tobacco Use: Never    Passive Exposure: Not on file  Financial Resource Strain: Low Risk (01/22/2022)   Overall Financial Resource Strain (CARDIA)    Difficulty of Paying Living Expenses: Not hard at all  Food Insecurity: No Food Insecurity (01/22/2022)   Hunger Vital Sign    Worried About Running Out of Food in the Last Year: Never true    Ran Out of Food in the Last Year: Never true  Transportation Needs: No Transportation Needs (01/22/2022)   PRAPARE - Administrator, Civil Service (Medical): No    Lack of Transportation (Non-Medical): No  Physical Activity: Sufficiently Active (01/22/2022)   Exercise Vital Sign    Days of Exercise per Week: 5 days    Minutes of Exercise per Session: 30 min  Stress: No Stress Concern Present (01/22/2022)   [employer] of Occupational Health - Occupational Stress Questionnaire    Feeling of Stress : Not at all  Social Connections: Socially Integrated (01/22/2022)   Social Connection and Isolation Panel    Frequency of Communication with Friends and Family: More than three times a week    Frequency of Social Gatherings with Friends and Family: More than three  times a week    Attends Religious Services: More than 4 times per year    Active Member of Clubs or Organizations: Yes    Attends Banker Meetings: More than 4 times per year    Marital Status: Married  Depression (PHQ2-9): Low Risk (11/29/2023)   Depression (PHQ2-9)    PHQ-2 Score: 0  Alcohol Screen: Low Risk (01/22/2022)   Alcohol Screen    Last Alcohol Screening Score (AUDIT): 2  Housing: Low Risk (01/22/2022)   Housing    Last Housing Risk Score: 0  Utilities: Not on file  Health Literacy: Not on file   Vitals:   07/11/24 1443  BP: 124/78  Pulse: 60  Resp: 16  Temp: 97.7 F (36.5 C)  SpO2: 98%   Body mass index is 23.63 kg/m.  Physical Exam Vitals and nursing note reviewed.  Constitutional:      General: She is not in acute distress.    Appearance: She is well-developed.  HENT:     Head: Normocephalic and atraumatic.     Mouth/Throat:     Mouth: Mucous membranes are moist.     Pharynx: Uvula midline.  Eyes:     Conjunctiva/sclera: Conjunctivae normal.  Cardiovascular:     Rate and Rhythm: Normal rate and regular rhythm. Occasional Extrasystoles (X 1) are present.    Heart sounds: No murmur heard.    Comments: PT pulses palpable. Pulmonary:     Effort: Pulmonary effort is normal. No respiratory distress.     Breath sounds: Normal breath sounds.  Abdominal:     Palpations: Abdomen is soft. There is no mass.     Tenderness: There is no abdominal tenderness.  Musculoskeletal:     Thoracic back: Deformity (kyphosis) present.     Right lower leg: No edema.     Left lower leg: No edema.  Skin:    General: Skin is warm.     Findings: No erythema or rash.  Neurological:     General: No focal deficit present.     Mental Status: She is alert and oriented to person, place, and time.     Comments: Antalgic/unstable pain assisted with a cane.  Psychiatric:        Mood and Affect: Mood and affect normal.  ASSESSMENT AND PLAN:  Ms. Espino was seen  today for chronic disease management.   Chronic pain disorder Assessment & Plan: Problem is adequately controlled with current regimen. Currently on hydrocodone -acetaminophen  5-325 mg 70 tablets/month, she usually takes it twice daily and an extra one if needed. Well tolerated. She cannot take NSAIDs, hx of CKD and HTN. PDMP reviewed. Med contract signed today. Follow up in 5-6 months.  Osteoarthritis of multiple joints, unspecified osteoarthritis type Assessment & Plan: Back, knees, and left shoulder mainly. Follows with rheumatologist. Hydrocodone -acetaminophen  5-325 mg tid prn helps with pain.  Paroxysmal atrial fibrillation (HCC) Assessment & Plan: Rhythm and rate controlled. She is not on chronic anticoagulation and no longer following with cardiologist.  Stage 3a chronic kidney disease (HCC) Assessment & Plan: Cr and e GFR mildly worse 05/2024. BMP is  usually checked at her rheumatologist office, who is planning on visiting in the next few days/weeks. Encouraged adequate hydration. Continue low-salt diet and avoidance of NSAIDs. Currently on a irbesartan .  Hypertension, essential, benign Assessment & Plan: BP adequately controlled. Continue irbesartan  150 mg daily and low-salt diet.  Return in about 6 months (around 01/08/2025) for chronic problems.  Khush Pasion G. Sariyah Corcino, MD  [facility]. [place] office.  "

## 2024-07-11 NOTE — Telephone Encounter (Signed)
 Copied from CRM [id number]. Topic: Clinical - Medication Refill >> Jul 11, 2024  9:32 AM Amber H wrote: Medication: HYDROcodone -acetaminophen  (NORCO/VICODIN) 5-325 MG tablet- requesting an emergency supply because she 1 pill left.   Has the patient contacted their pharmacy? Yes, was advised to contact provider  (Agent: If no, request that the patient contact the pharmacy for the refill. If patient does not wish to contact the pharmacy document the reason why and proceed with request.) (Agent: If yes, when and what did the pharmacy advise?)  This is the patient's preferred pharmacy:  [facility] [place], [place] - [address] [place] [address] Phone: [phone] Fax: [phone]  Is this the correct pharmacy for this prescription? Yes If no, delete pharmacy and type the correct one.   Has the prescription been filled recently? Yes  Is the patient out of the medication? No- has 1 pill left   Has the patient been seen for an appointment in the last year OR does the patient have an upcoming appointment? Yes, 06/05/2024   Can we respond through MyChart? No  Agent: Please be advised that Rx refills may take up to 3 business days. We ask that you follow-up with your pharmacy.

## 2024-07-11 NOTE — Assessment & Plan Note (Signed)
 Back, knees, and left shoulder mainly. Follows with rheumatologist.

## 2024-07-11 NOTE — Assessment & Plan Note (Signed)
 Cr and e GFR mildly worse 05/2024. BMP is  usually checked at her rheumatologist office, who is planning on visiting in the next few days/weeks. Encouraged adequate hydration. Continue low-salt diet and avoidance of NSAIDs. Currently on a irbesartan .

## 2024-07-12 ENCOUNTER — Encounter: Payer: Self-pay | Admitting: Family Medicine

## 2024-07-13 MED ORDER — HYDROCODONE-ACETAMINOPHEN 5-325 MG PO TABS: 1.0000 | ORAL_TABLET | Freq: Three times a day (TID) | ORAL | 0 refills | Status: AC | PRN

## 2024-07-13 NOTE — Addendum Note (Signed)
 Addended by: Macrae Wiegman G on: 07/13/2024 10:03 PM   Modules accepted: Orders

## 2025-01-08 ENCOUNTER — Ambulatory Visit: Admitting: Family Medicine
